# Patient Record
Sex: Male | Born: 1955 | Race: Black or African American | Hispanic: No | Marital: Married | Smoking: Never smoker
Health system: Southern US, Community
[De-identification: ages and names within clinical notes are randomized; demographics above are authoritative.]

## PROBLEM LIST (undated history)

## (undated) ENCOUNTER — Ambulatory Visit (HOSPITAL_COMMUNITY): Discharge status: Absent without leave | Payer: 59

## (undated) DIAGNOSIS — I509 Heart failure, unspecified: Secondary | ICD-10-CM

## (undated) DIAGNOSIS — I4891 Unspecified atrial fibrillation: Secondary | ICD-10-CM

## (undated) DIAGNOSIS — I429 Cardiomyopathy, unspecified: Secondary | ICD-10-CM

## (undated) HISTORY — DX: Heart failure, unspecified: I50.9

## (undated) HISTORY — DX: Cardiomyopathy, unspecified: I42.9

## (undated) HISTORY — DX: Unspecified atrial fibrillation: I48.91

---

## 2009-10-13 ENCOUNTER — Emergency Department (HOSPITAL_COMMUNITY): Admission: EM | Admit: 2009-10-13 | Discharge: 2009-10-13 | Payer: Self-pay | Admitting: Family Medicine

## 2009-11-28 ENCOUNTER — Emergency Department (HOSPITAL_COMMUNITY): Admission: EM | Admit: 2009-11-28 | Discharge: 2009-11-28 | Payer: Self-pay | Admitting: Family Medicine

## 2010-03-17 ENCOUNTER — Encounter
Admission: RE | Admit: 2010-03-17 | Discharge: 2010-03-17 | Payer: Self-pay | Source: Home / Self Care | Attending: Emergency Medicine | Admitting: Emergency Medicine

## 2010-06-19 LAB — POCT I-STAT, CHEM 8
BUN: 12 mg/dL (ref 6–23)
Chloride: 104 mEq/L (ref 96–112)
Potassium: 3.8 mEq/L (ref 3.5–5.1)
Sodium: 139 mEq/L (ref 135–145)
TCO2: 29 mmol/L (ref 0–100)
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
## 2013-11-07 ENCOUNTER — Encounter (HOSPITAL_COMMUNITY): Payer: Self-pay | Admitting: Emergency Medicine

## 2013-11-07 ENCOUNTER — Emergency Department (HOSPITAL_COMMUNITY)
Admission: EM | Admit: 2013-11-07 | Discharge: 2013-11-07 | Disposition: A | Discharge status: Home / Self Care | Payer: 59 | Attending: Emergency Medicine | Admitting: Emergency Medicine

## 2013-11-07 DIAGNOSIS — Z791 Long term (current) use of non-steroidal anti-inflammatories (NSAID): Secondary | ICD-10-CM | POA: Diagnosis not present

## 2013-11-07 DIAGNOSIS — Y9289 Other specified places as the place of occurrence of the external cause: Secondary | ICD-10-CM | POA: Insufficient documentation

## 2013-11-07 DIAGNOSIS — T63441A Toxic effect of venom of bees, accidental (unintentional), initial encounter: Secondary | ICD-10-CM

## 2013-11-07 DIAGNOSIS — T63461A Toxic effect of venom of wasps, accidental (unintentional), initial encounter: Secondary | ICD-10-CM | POA: Insufficient documentation

## 2013-11-07 DIAGNOSIS — Y93H2 Activity, gardening and landscaping: Secondary | ICD-10-CM | POA: Insufficient documentation

## 2013-11-07 DIAGNOSIS — IMO0001 Reserved for inherently not codable concepts without codable children: Secondary | ICD-10-CM | POA: Diagnosis present

## 2013-11-07 DIAGNOSIS — T6391XA Toxic effect of contact with unspecified venomous animal, accidental (unintentional), initial encounter: Secondary | ICD-10-CM | POA: Insufficient documentation

## 2013-11-07 MED ORDER — DIPHENHYDRAMINE HCL 25 MG PO TABS: 25.0000 mg | ORAL_TABLET | Freq: Four times a day (QID) | ORAL | Status: DC

## 2013-11-07 MED ORDER — DIPHENHYDRAMINE HCL 25 MG PO CAPS: 50.0000 mg | ORAL_CAPSULE | Freq: Once | ORAL | Status: DC

## 2013-11-07 MED ORDER — DIPHENHYDRAMINE HCL 25 MG PO CAPS
25.0000 mg | ORAL_CAPSULE | Freq: Once | ORAL | Status: AC
  Administered 2013-11-07: 25 mg via ORAL
  Filled 2013-11-07: qty 1

## 2013-11-07 MED ORDER — PREDNISONE (PAK) 10 MG PO TABS: ORAL_TABLET | Freq: Every day | ORAL | Status: DC

## 2013-11-07 NOTE — ED Provider Notes (Signed)
Medical screening examination/treatment/procedure(s) were performed by non-physician practitioner and as supervising physician I was immediately available for consultation/collaboration.   EKG Interpretation None        Richardean Canal, MD 11/07/13 2336

## 2013-11-07 NOTE — ED Notes (Signed)
Presents with left elbow redness and swelling began yesterday after a wasp sting to left elbow, denies pain, reports mild itching. Airway intact.

## 2013-11-07 NOTE — Discharge Instructions (Signed)
Read the information below.  Use the prescribed medication as directed.  Please discuss all new medications with your pharmacist.  You may return to the Emergency Department at any time for worsening condition or any new symptoms that concern you.  If you develop high fevers, difficulty swallowing or breathing, or you are unable to tolerate fluids by mouth, return to the ER immediately for a recheck.  If you develop increased redness, uncontrolled pain, pus draining from the wound, or fevers greater than 100.4, return to the ER immediately for a recheck.     Bee, Wasp, or Hornet Sting Your caregiver has diagnosed you as having an insect sting. An insect sting appears as a red lump in the skin that sometimes has a tiny hole in the center, or it may have a stinger in the center of the wound. The most common stings are from wasps, hornets and bees. Individuals have different reactions to insect stings.  A normal reaction may cause pain, swelling, and redness around the sting site.  A localized allergic reaction may cause swelling and redness that extends beyond the sting site.  A large local reaction may continue to develop over the next 12 to 36 hours.  On occasion, the reactions can be severe (anaphylactic reaction). An anaphylactic reaction may cause wheezing; difficulty breathing; chest pain; fainting; raised, itchy, red patches on the skin; a sick feeling to your stomach (nausea); vomiting; cramping; or diarrhea. If you have had an anaphylactic reaction to an insect sting in the past, you are more likely to have one again. HOME CARE INSTRUCTIONS   With bee stings, a small sac of poison is left in the wound. Brushing across this with something such as a credit card, or anything similar, will help remove this and decrease the amount of the reaction. This same procedure will not help a wasp sting as they do not leave behind a stinger and poison sac.  Apply a cold compress for 10 to 20 minutes every  hour for 1 to 2 days, depending on severity, to reduce swelling and itching.  To lessen pain, a paste made of water and baking soda may be rubbed on the bite or sting and left on for 5 minutes.  To relieve itching and swelling, you may use take medication or apply medicated creams or lotions as directed.  Only take over-the-counter or prescription medicines for pain, discomfort, or fever as directed by your caregiver.  Wash the sting site daily with soap and water. Apply antibiotic ointment on the sting site as directed.  If you suffered a severe reaction:  If you did not require hospitalization, an adult will need to stay with you for 24 hours in case the symptoms return.  You may need to wear a medical bracelet or necklace stating the allergy.  You and your family need to learn when and how to use an anaphylaxis kit or epinephrine injection.  If you have had a severe reaction before, always carry your anaphylaxis kit with you. SEEK MEDICAL CARE IF:   None of the above helps within 2 to 3 days.  The area becomes red, warm, tender, and swollen beyond the area of the bite or sting.  You have an oral temperature above 102 F (38.9 C). SEEK IMMEDIATE MEDICAL CARE IF:  You have symptoms of an allergic reaction which are:  Wheezing.  Difficulty breathing.  Chest pain.  Lightheadedness or fainting.  Itchy, raised, red patches on the skin.  Nausea, vomiting, cramping  or diarrhea. ANY OF THESE SYMPTOMS MAY REPRESENT A SERIOUS PROBLEM THAT IS AN EMERGENCY. Do not wait to see if the symptoms will go away. Get medical help right away. Call your local emergency services (911 in [place] DO NOT drive yourself to the hospital. MAKE SURE YOU:   Understand these instructions.  Will watch your condition.  Will get help right away if you are not doing well or get worse. Document Released: 03/20/2005 Document Revised: 06/12/2011 Document Reviewed: 09/04/2009 [facility] Patient Information  2015 [place], [place]. This information is not intended to replace advice given to you by your health care provider. Make sure you discuss any questions you have with your health care provider.

## 2013-11-07 NOTE — ED Provider Notes (Signed)
CSN: [id number]     Arrival date & time 11/07/13  2051 History  This chart was scribed for Garrett Carroll, [place] working with Richardean Canal, MD by Quintella Reichert, ED Scribe. This patient was seen in room TR07C/TR07C and the patient's care was started at 10:18 PM.   Chief Complaint  Patient presents with  . Allergic Reaction  . Insect Bite    The history is provided by the patient. No language interpreter was used.    HPI Comments: Garrett Carroll is a 58 y.o. male who presents to the Emergency Department complaining of a wasp sting to his left elbow sustained yesterday.  Pt states he was stung while trimming his hedges.  He has since developed redness, swelling and mild itching and "tightness" to that area.  He is able to move the elbow without pain.  He has placed ice on the area but has not attempted any other treatments pta.  He denies swelling or itching in mouth or throat, trouble swallowing, difficulty breathing, numbness or tingling in arms, fevers, chills, generalized body aches, CP, SOB, nausea, vomiting, or any other associated symptoms.  He denies prior h/o allergic reactions.   History reviewed. No pertinent past medical history.  History reviewed. No pertinent past surgical history.  History reviewed. No pertinent family history.   History  Substance Use Topics  . Smoking status: Never Smoker   . Smokeless tobacco: Not on file  . Alcohol Use: No     Review of Systems  HENT: Negative for trouble swallowing.   Respiratory: Negative for shortness of breath.   Cardiovascular: Negative for chest pain.  Gastrointestinal: Negative for nausea and vomiting.  Musculoskeletal: Negative for myalgias.  Skin: Positive for wound (wasp sting to left elbow, with redness, swelling and mild itching and "tightness" to that area).  All other systems reviewed and are negative.     Allergies  Review of patient's allergies indicates no known allergies.  Home Medications   Prior to  Admission medications   Medication Sig Start Date End Date Taking? Authorizing Provider  ibuprofen (ADVIL,MOTRIN) 200 MG tablet Take 200 mg by mouth every 6 (six) hours as needed for moderate pain.   Yes Historical Provider, MD   BP 112/73  Pulse 78  Temp(Src) 98 F (36.7 C) (Oral)  Resp 18  SpO2 95%  Physical Exam  Nursing note and vitals reviewed. Constitutional: He appears well-developed and well-nourished. No distress.  HENT:  Head: Normocephalic and atraumatic.  Mouth/Throat: Oropharynx is clear and moist. No oropharyngeal exudate.  Neck: Neck supple.  Cardiovascular: Normal rate and regular rhythm.   Pulmonary/Chest: Effort normal and breath sounds normal. No stridor. No respiratory distress. He has no wheezes. He has no rales.  Musculoskeletal:  Full AROM of left elbow.  Sensation intact.  Distal pulses intact.    Neurological: He is alert.  Skin: He is not diaphoretic.  Edema and erythema over the posterior distal upper left arm, no areas of induration or fluctuance.  Edema tracks just inferior to the elbow.  Nontender.    ED Course  Procedures (including critical care time)  DIAGNOSTIC STUDIES: Oxygen Saturation is 95% on room air, adequate by my interpretation.    COORDINATION OF CARE: 10:25 PM-Discussed treatment plan which includes Benadryl in the ED, prednisone prescription, cold compresses and elevation with pt at bedside and pt agreed to plan.     Labs Review Labs Reviewed - No data to display  Imaging Review No results found.  EKG Interpretation None      MDM   Final diagnoses:  Local reaction to bee sting, accidental or unintentional, initial encounter    Afebrile, nontoxic patient with local reaction to bee sting that occurred yesterday.  No tenderness or pain.  No e/o abscess.  Doubt cellulitis.   No airway symptoms or concerns.  Doubt systemic allergic reaction. Benadryl given in ED.  Joint decision making regarding medications. I gave  patient the option of steroid tonight or start dose pack tomorrow morning - pt prefers to start tomorrow morning so he can sleep well tonight.  As the reaction is only local and there is no immediate or urgent concern that would require more immediate medication, I believe this is a safe option.   D/C home with benadryl and prednisone.  Discussed result, findings, treatment, and follow up  with patient.  Pt given return precautions.  Pt verbalizes understanding and agrees with plan.         I personally performed the services described in this documentation, which was scribed in my presence. The recorded information has been reviewed and is accurate.   Lincoln UniversityEmily Vidalia Serpas, PA-C 11/07/13 2257

## 2013-11-07 NOTE — ED Notes (Signed)
The  Pt was stung by a wasp yesterday on the lt elbow.  He has pain red ness and swelling to the lt elbow area.  Swelling down into the lt  Forearm upper

## 2014-07-29 ENCOUNTER — Ambulatory Visit: Payer: 59 | Admitting: Podiatry

## 2015-01-06 ENCOUNTER — Emergency Department (HOSPITAL_COMMUNITY): Admission: EM | Admit: 2015-01-06 | Discharge: 2015-01-06 | Payer: Self-pay

## 2015-10-21 ENCOUNTER — Encounter (HOSPITAL_COMMUNITY): Payer: Self-pay | Admitting: *Deleted

## 2015-10-21 ENCOUNTER — Ambulatory Visit (HOSPITAL_COMMUNITY)
Admission: EM | Admit: 2015-10-21 | Discharge: 2015-10-21 | Disposition: A | Discharge status: Home / Self Care | Payer: 59 | Attending: Emergency Medicine | Admitting: Emergency Medicine

## 2015-10-21 DIAGNOSIS — L259 Unspecified contact dermatitis, unspecified cause: Secondary | ICD-10-CM

## 2015-10-21 MED ORDER — PREDNISONE 10 MG (21) PO TBPK: 10.0000 mg | ORAL_TABLET | Freq: Every day | ORAL | Status: DC

## 2015-10-21 NOTE — Discharge Instructions (Signed)
Start Prednisone as directed. Use Benadryl as needed for itching. Wash affected area with soap and water. Follow-up with your primary care provider if not rash is not improving within 3 to 4 days.   Contact Dermatitis Dermatitis is redness, soreness, and swelling (inflammation) of the skin. Contact dermatitis is a reaction to certain substances that touch the skin. There are two types of contact dermatitis:   Irritant contact dermatitis. This type is caused by something that irritates your skin, such as dry hands from washing them too much. This type does not require previous exposure to the substance for a reaction to occur. This type is more common.  Allergic contact dermatitis. This type is caused by a substance that you are allergic to, such as a nickel allergy or poison ivy. This type only occurs if you have been exposed to the substance (allergen) before. Upon a repeat exposure, your body reacts to the substance. This type is less common. CAUSES  Many different substances can cause contact dermatitis. Irritant contact dermatitis is most commonly caused by exposure to:   Makeup.   Soaps.   Detergents.   Bleaches.   Acids.   Metal salts, such as nickel.  Allergic contact dermatitis is most commonly caused by exposure to:   Poisonous plants.   Chemicals.   Jewelry.   Latex.   Medicines.   Preservatives in products, such as clothing.  RISK FACTORS This condition is more likely to develop in:   People who have jobs that expose them to irritants or allergens.  People who have certain medical conditions, such as asthma or eczema.  SYMPTOMS  Symptoms of this condition may occur anywhere on your body where the irritant has touched you or is touched by you. Symptoms include:  Dryness or flaking.   Redness.   Cracks.   Itching.   Pain or a burning feeling.   Blisters.  Drainage of small amounts of blood or clear fluid from skin cracks. With  allergic contact dermatitis, there may also be swelling in areas such as the eyelids, mouth, or genitals.  DIAGNOSIS  This condition is diagnosed with a medical history and physical exam. A patch skin test may be performed to help determine the cause. If the condition is related to your job, you may need to see an occupational medicine specialist. TREATMENT Treatment for this condition includes figuring out what caused the reaction and protecting your skin from further contact. Treatment may also include:   Steroid creams or ointments. Oral steroid medicines may be needed in more severe cases.  Antibiotics or antibacterial ointments, if a skin infection is present.  Antihistamine lotion or an antihistamine taken by mouth to ease itching.  A bandage (dressing). HOME CARE INSTRUCTIONS Skin Care  Moisturize your skin as needed.   Apply cool compresses to the affected areas.  Try taking a bath with:  Epsom salts. Follow the instructions on the packaging. You can get these at your local pharmacy or grocery store.  Baking soda. Pour a small amount into the bath as directed by your health care provider.  Colloidal oatmeal. Follow the instructions on the packaging. You can get this at your local pharmacy or grocery store.  Try applying baking soda paste to your skin. Stir water into baking soda until it reaches a paste-like consistency.  Do not scratch your skin.  Bathe less frequently, such as every other day.  Bathe in lukewarm water. Avoid using hot water. Medicines  Take or apply over-the-counter and  prescription medicines only as told by your health care provider.   If you were prescribed an antibiotic medicine, take or apply your antibiotic as told by your health care provider. Do not stop using the antibiotic even if your condition starts to improve. General Instructions  Keep all follow-up visits as told by your health care provider. This is important.  Avoid the  substance that caused your reaction. If you do not know what caused it, keep a journal to try to track what caused it. Write down:  What you eat.  What cosmetic products you use.  What you drink.  What you wear in the affected area. This includes jewelry.  If you were given a dressing, take care of it as told by your health care provider. This includes when to change and remove it. SEEK MEDICAL CARE IF:   Your condition does not improve with treatment.  Your condition gets worse.  You have signs of infection such as swelling, tenderness, redness, soreness, or warmth in the affected area.  You have a fever.  You have new symptoms. SEEK IMMEDIATE MEDICAL CARE IF:   You have a severe headache, neck pain, or neck stiffness.  You vomit.  You feel very sleepy.  You notice red streaks coming from the affected area.  Your bone or joint underneath the affected area becomes painful after the skin has healed.  The affected area turns darker.  You have difficulty breathing.   This information is not intended to replace advice given to you by your health care provider. Make sure you discuss any questions you have with your health care provider.   Document Released: 03/17/2000 Document Revised: 12/09/2014 Document Reviewed: 08/05/2014 Elsevier Interactive Patient Education [employer]

## 2015-10-21 NOTE — ED Provider Notes (Signed)
CSN: [id number]     Arrival date & time 10/21/15  1440 History   First MD Initiated Contact with Patient 10/21/15 [phone]     Chief Complaint  Patient presents with  . Rash   (Consider location/radiation/quality/duration/timing/severity/associated sxs/prior Treatment) HPI Comments: Patient presents with red, raised itchy rash that started on his right side of his neck 2 days ago and has spread to the right side of his face. The rash is spreading very close to his right eye. He has a few spots as well on his right wrist. Rash is not painful. He was pulling weeds in his yard last week and may have been exposed to poison ivy/oak. Has applied OTC Cortisone cream and taken oral Benadryl with minimal relief.   Patient is a 60 y.o. male presenting with rash. The history is provided by the patient.  Rash Location:  Head/neck and face Head/neck rash location:  R neck Facial rash location:  R cheek and face Quality: itchiness   Severity:  Moderate Onset quality:  Gradual Duration:  2 days Timing:  Constant Progression:  Spreading Chronicity:  New Context: plant contact   Relieved by:  Nothing Ineffective treatments:  Topical steroids Associated symptoms: no fever and no headaches     History reviewed. No pertinent past medical history. History reviewed. No pertinent past surgical history. History reviewed. No pertinent family history. Social History  Substance Use Topics  . Smoking status: Never Smoker   . Smokeless tobacco: None  . Alcohol Use: No    Review of Systems  Constitutional: Negative for fever.  Eyes: Negative for visual disturbance.  Skin: Positive for rash.  Neurological: Negative for headaches.    Allergies  Review of patient's allergies indicates no known allergies.  Home Medications   Prior to Admission medications   Medication Sig Start Date End Date Taking? Authorizing Provider  diphenhydrAMINE (BENADRYL) 25 MG tablet Take 1 tablet (25 mg total) by mouth every  6 (six) hours. X 3 days, then PRN itching and swelling 11/07/13   Trixie Dredge, PA-C  ibuprofen (ADVIL,MOTRIN) 200 MG tablet Take 200 mg by mouth every 6 (six) hours as needed for moderate pain.    Historical Provider, MD  predniSONE (STERAPRED UNI-PAK 21 TAB) 10 MG (21) TBPK tablet Take 1 tablet (10 mg total) by mouth daily. Take 6 tabs by mouth daily  for 2 days, then 5 tabs for 2 days, then 4 tabs for 2 days, then 3 tabs for 2 days, 2 tabs for 2 days, then 1 tab by mouth daily for 2 days 10/21/15   Sudie Grumbling, NP   Meds Ordered and Administered this Visit  Medications - No data to display  BP 113/70 mmHg  Pulse 54  Temp(Src) 98.7 F (37.1 C) (Oral)  Resp 12  SpO2 99% No data found.   Physical Exam  Constitutional: He is oriented to person, place, and time. He appears well-developed and well-nourished.  HENT:  Head: Normocephalic and atraumatic.    Right Ear: Hearing, tympanic membrane, external ear and ear canal normal.  Left Ear: Hearing, tympanic membrane, external ear and ear canal normal.  Nose: Nose normal.  Mouth/Throat: Oropharynx is clear and moist and mucous membranes are normal.  Multiple red raised papular lesions in varying sizes present on right side of neck, cheek, temple and lateral aspect of right eye. No discharge or crusting. No surrounding erythema. Non-tender.   Neck: Normal range of motion. Neck supple.  Cardiovascular: Normal rate, regular rhythm and  normal heart sounds.   Lymphadenopathy:    He has no cervical adenopathy.  Neurological: He is alert and oriented to person, place, and time.  Skin: Skin is warm, dry and intact. Rash noted. Rash is papular and urticarial.    ED Course  Procedures (including critical care time)  Labs Review Labs Reviewed - No data to display  Imaging Review No results found.   Visual Acuity Review  Right Eye Distance:   Left Eye Distance:   Bilateral Distance:    Right Eye Near:   Left Eye Near:    Bilateral  Near:         MDM   1. Contact dermatitis    Due to close proximity to eye and rash spreading on face, recommend Prednisone dose pack as directed. Continue oral OTC Benadryl as needed for itching. Clean area with soap and water only. Cool showers- avoid hot water. If rash continues to spread near eye, go to ER. Otherwise follow-up with your primary care provider if rash does not improve within 3 to 5 days.     Sudie Grumbling, NP 10/22/15 [phone]

## 2015-10-21 NOTE — ED Notes (Signed)
Pt  Has  Symptoms  Of  Rash  On r  Side face neck and shoulders      Symptoms    Not  releived  By  hydrocoirtisone cream

## 2016-08-05 DIAGNOSIS — L720 Epidermal cyst: Secondary | ICD-10-CM | POA: Diagnosis not present

## 2016-08-05 DIAGNOSIS — H6123 Impacted cerumen, bilateral: Secondary | ICD-10-CM | POA: Diagnosis not present

## 2016-08-05 DIAGNOSIS — R0982 Postnasal drip: Secondary | ICD-10-CM | POA: Diagnosis not present

## 2016-08-08 DIAGNOSIS — H6123 Impacted cerumen, bilateral: Secondary | ICD-10-CM | POA: Diagnosis not present

## 2017-08-07 DIAGNOSIS — K4190 Unilateral femoral hernia, without obstruction or gangrene, not specified as recurrent: Secondary | ICD-10-CM | POA: Diagnosis not present

## 2017-08-07 DIAGNOSIS — K4090 Unilateral inguinal hernia, without obstruction or gangrene, not specified as recurrent: Secondary | ICD-10-CM | POA: Diagnosis not present

## 2018-06-19 DIAGNOSIS — Z23 Encounter for immunization: Secondary | ICD-10-CM | POA: Diagnosis not present

## 2018-10-02 ENCOUNTER — Other Ambulatory Visit: Payer: Self-pay

## 2018-10-02 ENCOUNTER — Ambulatory Visit (HOSPITAL_COMMUNITY)
Admission: EM | Admit: 2018-10-02 | Discharge: 2018-10-02 | Disposition: A | Discharge status: Home / Self Care | Payer: 59 | Attending: Internal Medicine | Admitting: Internal Medicine

## 2018-10-02 ENCOUNTER — Encounter (HOSPITAL_COMMUNITY): Payer: Self-pay | Admitting: Emergency Medicine

## 2018-10-02 DIAGNOSIS — M545 Low back pain, unspecified: Secondary | ICD-10-CM

## 2018-10-02 MED ORDER — NAPROXEN 375 MG PO TABS: 375.0000 mg | ORAL_TABLET | Freq: Two times a day (BID) | ORAL | 0 refills | Status: DC

## 2018-10-02 MED ORDER — CYCLOBENZAPRINE HCL 10 MG PO TABS: 10.0000 mg | ORAL_TABLET | Freq: Three times a day (TID) | ORAL | 0 refills | Status: DC | PRN

## 2018-10-02 MED ORDER — KETOROLAC TROMETHAMINE 60 MG/2ML IM SOLN
INTRAMUSCULAR | Status: AC
  Filled 2018-10-02: qty 2

## 2018-10-02 MED ORDER — KETOROLAC TROMETHAMINE 60 MG/2ML IM SOLN
60.0000 mg | Freq: Once | INTRAMUSCULAR | Status: AC
  Administered 2018-10-02: 60 mg via INTRAMUSCULAR

## 2018-10-02 NOTE — ED Provider Notes (Signed)
[place]    CSN: [id number] Arrival date & time: 10/02/18  1708      History   Chief Complaint Chief Complaint  Patient presents with  . Back Pain    HPI Garrett Carroll is a 63 y.o. male with no past medical history comes to urgent care with complaint of back pain of 4 days duration.  Symptoms started on Saturday after he completed moving some boxes.  Pain is constant and throbbing, currently severe.  Aggravated by movement.  He is tried using a heating pad over prolonged period of time with no relief.  He denies any numbness or tingling the lower extremities.  No lower extremity weakness.  No fever or chills.   HPI  History reviewed. No pertinent past medical history.  There are no active problems to display for this patient.   No past surgical history on file.     Home Medications    Prior to Admission medications   Medication Sig Start Date End Date Taking? Authorizing Provider  diphenhydrAMINE (BENADRYL) 25 MG tablet Take 1 tablet (25 mg total) by mouth every 6 (six) hours. X 3 days, then PRN itching and swelling 11/07/13   West, Emily, PA-C  ibuprofen (ADVIL,MOTRIN) 200 MG tablet Take 200 mg by mouth every 6 (six) hours as needed for moderate pain.    [provider]  predniSONE (STERAPRED UNI-PAK 21 TAB) 10 MG (21) TBPK tablet Take 1 tablet (10 mg total) by mouth daily. Take 6 tabs by mouth daily  for 2 days, then 5 tabs for 2 days, then 4 tabs for 2 days, then 3 tabs for 2 days, 2 tabs for 2 days, then 1 tab by mouth daily for 2 days 10/21/15   Katy Apo, NP    Family History No family history on file.  Social History Social History   Tobacco Use  . Smoking status: Never Smoker  Substance Use Topics  . Alcohol use: No  . Drug use: No     Allergies   Patient has no known allergies.   Review of Systems Review of Systems  Constitutional: Positive for activity change. Negative for appetite change, chills, fatigue and fever.   HENT: Negative.   Respiratory: Negative.   Cardiovascular: Negative.   Gastrointestinal: Negative.   Endocrine: Negative.   Genitourinary: Negative.   Musculoskeletal: Positive for back pain. Negative for gait problem, joint swelling and myalgias.  Skin: Negative.   Allergic/Immunologic: Negative.   Neurological: Negative for dizziness, weakness, numbness and headaches.     Physical Exam Triage Vital Signs ED Triage Vitals  Enc Vitals Group     BP 10/02/18 1806 121/84     Pulse Rate 10/02/18 1806 61     Resp 10/02/18 1806 16     Temp 10/02/18 1806 98.2 F (36.8 C)     Temp Source 10/02/18 1806 Oral     SpO2 10/02/18 1806 98 %     Weight --      Height --      Head Circumference --      Peak Flow --      Pain Score 10/02/18 1807 10     Pain Loc --      Pain Edu? --      Excl. in [place]? --    No data found.  Updated Vital Signs BP 121/84   Pulse 61   Temp 98.2 F (36.8 C) (Oral)   Resp 16   SpO2 98%  Visual Acuity Right Eye Distance:   Left Eye Distance:   Bilateral Distance:    Right Eye Near:   Left Eye Near:    Bilateral Near:     Physical Exam Constitutional:      General: He is in acute distress.     Appearance: Normal appearance. He is not ill-appearing.  Cardiovascular:     Rate and Rhythm: Normal rate and regular rhythm.     Pulses: Normal pulses.     Heart sounds: Normal heart sounds.  Abdominal:     General: Bowel sounds are normal.     Palpations: Abdomen is soft.  Musculoskeletal: Normal range of motion.        General: Signs of injury present. No swelling or deformity.     Comments: Tenderness over the right sacroiliac area.  Skin:    Capillary Refill: Capillary refill takes less than 2 seconds.  Neurological:     General: No focal deficit present.     Mental Status: He is alert and oriented to person, place, and time.     Cranial Nerves: No cranial nerve deficit.     Sensory: No sensory deficit.     Motor: No weakness.      Coordination: Coordination normal.     Gait: Gait normal.      UC Treatments / Results  Labs (all labs ordered are listed, but only abnormal results are displayed) Labs Reviewed - No data to display  EKG   Radiology No results found.  Procedures Procedures (including critical care time)  Medications Ordered in UC Medications - No data to display  Initial Impression / Assessment and Plan / UC Course  I have reviewed the triage vital signs and the nursing notes.  Pertinent labs & imaging results that were available during my care of the patient were reviewed by me and considered in my medical decision making (see chart for details).     1.  Acute lower back pain: R ICE therapy education Flexeril 10 mg 3 times daily as needed for muscle spasms Naproxen 375 mg orally twice daily Toradol 60 mg IM x1 No indication for lumbar spine x-ray. Patient is advised to use warm compress over 5 to 10 minutes and wait for about 15 to 20 minutes before applying warm compress again. Final Clinical Impressions(s) / UC Diagnoses   Final diagnoses:  None   Discharge Instructions   None    ED Prescriptions    None     Controlled Substance Prescriptions  Controlled Substance Registry consulted? No   Merrilee Jansky, MD 10/02/18 [phone]

## 2018-10-02 NOTE — ED Triage Notes (Signed)
PT reports back pain that started Saturday. It is worsened by bending / moving boxes.

## 2019-08-11 ENCOUNTER — Encounter (HOSPITAL_COMMUNITY): Payer: Self-pay

## 2019-08-11 ENCOUNTER — Ambulatory Visit (HOSPITAL_COMMUNITY)
Admission: EM | Admit: 2019-08-11 | Discharge: 2019-08-11 | Disposition: A | Discharge status: Home / Self Care | Payer: 59 | Attending: Emergency Medicine | Admitting: Emergency Medicine

## 2019-08-11 ENCOUNTER — Other Ambulatory Visit: Payer: Self-pay

## 2019-08-11 DIAGNOSIS — T23169A Burn of first degree of back of unspecified hand, initial encounter: Secondary | ICD-10-CM | POA: Diagnosis not present

## 2019-08-11 DIAGNOSIS — T23221A Burn of second degree of single right finger (nail) except thumb, initial encounter: Secondary | ICD-10-CM

## 2019-08-11 MED ORDER — SILVER SULFADIAZINE 1 % EX CREA: 1.0000 "application " | TOPICAL_CREAM | Freq: Every day | CUTANEOUS | 0 refills | Status: DC
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
## 2019-08-11 NOTE — Discharge Instructions (Addendum)
Please use Tylenol [phone] mg every 4-6 hours, may supplement with Ibuprofen 600 mg every 8 hours May apply aloe vera for further relief/healing May apply wet gauze to keep cool Do not apply ice May use silvadene cream to burn  For any further burns run cool water over burn for 20 min immediately after
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
## 2022-06-11 ENCOUNTER — Encounter (HOSPITAL_COMMUNITY): Payer: Self-pay

## 2022-06-11 ENCOUNTER — Other Ambulatory Visit: Payer: Self-pay

## 2022-06-11 ENCOUNTER — Emergency Department (HOSPITAL_COMMUNITY): Payer: BC Managed Care – PPO

## 2022-06-11 ENCOUNTER — Inpatient Hospital Stay (HOSPITAL_COMMUNITY)
Admission: EM | Admit: 2022-06-11 | Discharge: 2022-06-13 | DRG: 291 | Disposition: A | Discharge status: Home / Self Care | Payer: BC Managed Care – PPO | Attending: Internal Medicine | Admitting: Internal Medicine

## 2022-06-11 DIAGNOSIS — M7989 Other specified soft tissue disorders: Secondary | ICD-10-CM

## 2022-06-11 DIAGNOSIS — E872 Acidosis, unspecified: Secondary | ICD-10-CM | POA: Diagnosis present

## 2022-06-11 DIAGNOSIS — R0602 Shortness of breath: Secondary | ICD-10-CM | POA: Diagnosis not present

## 2022-06-11 DIAGNOSIS — I110 Hypertensive heart disease with heart failure: Principal | ICD-10-CM | POA: Diagnosis present

## 2022-06-11 DIAGNOSIS — I493 Ventricular premature depolarization: Secondary | ICD-10-CM | POA: Diagnosis not present

## 2022-06-11 DIAGNOSIS — Z7901 Long term (current) use of anticoagulants: Secondary | ICD-10-CM

## 2022-06-11 DIAGNOSIS — I4891 Unspecified atrial fibrillation: Secondary | ICD-10-CM

## 2022-06-11 DIAGNOSIS — Z1152 Encounter for screening for COVID-19: Secondary | ICD-10-CM

## 2022-06-11 DIAGNOSIS — I5021 Acute systolic (congestive) heart failure: Secondary | ICD-10-CM | POA: Diagnosis present

## 2022-06-11 DIAGNOSIS — R9389 Abnormal findings on diagnostic imaging of other specified body structures: Secondary | ICD-10-CM

## 2022-06-11 DIAGNOSIS — Z6820 Body mass index (BMI) 20.0-20.9, adult: Secondary | ICD-10-CM

## 2022-06-11 DIAGNOSIS — I428 Other cardiomyopathies: Secondary | ICD-10-CM | POA: Diagnosis present

## 2022-06-11 DIAGNOSIS — Z79899 Other long term (current) drug therapy: Secondary | ICD-10-CM

## 2022-06-11 DIAGNOSIS — R0600 Dyspnea, unspecified: Secondary | ICD-10-CM

## 2022-06-11 DIAGNOSIS — E43 Unspecified severe protein-calorie malnutrition: Secondary | ICD-10-CM | POA: Diagnosis present

## 2022-06-11 DIAGNOSIS — Z8249 Family history of ischemic heart disease and other diseases of the circulatory system: Secondary | ICD-10-CM

## 2022-06-11 DIAGNOSIS — R7989 Other specified abnormal findings of blood chemistry: Secondary | ICD-10-CM

## 2022-06-11 LAB — CBC WITH DIFFERENTIAL/PLATELET
Abs Immature Granulocytes: 0.01 10*3/uL (ref 0.00–0.07)
Basophils Absolute: 0.1 10*3/uL (ref 0.0–0.1)
Basophils Relative: 1 %
Eosinophils Absolute: 0 10*3/uL (ref 0.0–0.5)
Eosinophils Relative: 0 %
HCT: 42.6 % (ref 39.0–52.0)
Hemoglobin: 14.1 g/dL (ref 13.0–17.0)
Immature Granulocytes: 0 %
Lymphocytes Relative: 29 %
Lymphs Abs: 1.4 10*3/uL (ref 0.7–4.0)
MCH: 30.3 pg (ref 26.0–34.0)
MCHC: 33.1 g/dL (ref 30.0–36.0)
MCV: 91.6 fL (ref 80.0–100.0)
Monocytes Absolute: 0.5 10*3/uL (ref 0.1–1.0)
Monocytes Relative: 10 %
Neutro Abs: 2.8 10*3/uL (ref 1.7–7.7)
Neutrophils Relative %: 60 %
Platelets: 158 10*3/uL (ref 150–400)
RBC: 4.65 MIL/uL (ref 4.22–5.81)
RDW: 14.7 % (ref 11.5–15.5)
WBC: 4.8 10*3/uL (ref 4.0–10.5)
nRBC: 0 % (ref 0.0–0.2)

## 2022-06-11 LAB — RESP PANEL BY RT-PCR (RSV, FLU A&B, COVID)  RVPGX2
Influenza A by PCR: NEGATIVE
Influenza B by PCR: NEGATIVE
Resp Syncytial Virus by PCR: NEGATIVE
SARS Coronavirus 2 by RT PCR: NEGATIVE

## 2022-06-11 LAB — COMPREHENSIVE METABOLIC PANEL
ALT: 37 U/L (ref 0–44)
AST: 30 U/L (ref 15–41)
Albumin: 3.8 g/dL (ref 3.5–5.0)
Alkaline Phosphatase: 60 U/L (ref 38–126)
Anion gap: 10 (ref 5–15)
BUN: 12 mg/dL (ref 8–23)
CO2: 21 mmol/L — ABNORMAL LOW (ref 22–32)
Calcium: 9.2 mg/dL (ref 8.9–10.3)
Chloride: 109 mmol/L (ref 98–111)
Creatinine, Ser: 1.13 mg/dL (ref 0.61–1.24)
GFR, Estimated: >60 mL/min (ref >60)
Glucose, Bld: 106 mg/dL — ABNORMAL HIGH (ref 70–99)
Potassium: 3.9 mmol/L (ref 3.5–5.1)
Sodium: 140 mmol/L (ref 135–145)
Total Bilirubin: 2.4 mg/dL — ABNORMAL HIGH (ref 0.3–1.2)
Total Protein: 6.5 g/dL (ref 6.5–8.1)

## 2022-06-11 LAB — T4, FREE: Free T4: 1.35 ng/dL — ABNORMAL HIGH (ref 0.61–1.12)

## 2022-06-11 LAB — MAGNESIUM: Magnesium: 2.2 mg/dL (ref 1.7–2.4)

## 2022-06-11 LAB — BRAIN NATRIURETIC PEPTIDE: B Natriuretic Peptide: 618.1 pg/mL — ABNORMAL HIGH (ref 0.0–100.0)

## 2022-06-11 LAB — TSH: TSH: 2.376 u[IU]/mL (ref 0.350–4.500)

## 2022-06-11 LAB — TROPONIN I (HIGH SENSITIVITY): Troponin I (High Sensitivity): 16 ng/L (ref <18)

## 2022-06-11 MED ORDER — PROCHLORPERAZINE EDISYLATE 10 MG/2ML IJ SOLN: 5.0000 mg | Freq: Four times a day (QID) | INTRAMUSCULAR | Status: DC | PRN

## 2022-06-11 MED ORDER — DIGOXIN 0.25 MG/ML IJ SOLN
0.2500 mg | Freq: Once | INTRAMUSCULAR | Status: AC
  Administered 2022-06-12: 0.25 mg via INTRAVENOUS
  Filled 2022-06-11: qty 1

## 2022-06-11 MED ORDER — DILTIAZEM HCL 25 MG/5ML IV SOLN
20.0000 mg | Freq: Once | INTRAVENOUS | Status: AC
  Administered 2022-06-11: 20 mg via INTRAVENOUS
  Filled 2022-06-11: qty 5

## 2022-06-11 MED ORDER — ACETAMINOPHEN 325 MG PO TABS
650.0000 mg | ORAL_TABLET | Freq: Four times a day (QID) | ORAL | Status: DC | PRN
  Administered 2022-06-12: 650 mg via ORAL
  Filled 2022-06-11: qty 2

## 2022-06-11 MED ORDER — DILTIAZEM LOAD VIA INFUSION
10.0000 mg | Freq: Once | INTRAVENOUS | Status: AC
  Administered 2022-06-11: 10 mg via INTRAVENOUS
  Filled 2022-06-11: qty 10

## 2022-06-11 MED ORDER — MELATONIN 5 MG PO TABS: 5.0000 mg | ORAL_TABLET | Freq: Every evening | ORAL | Status: DC | PRN

## 2022-06-11 MED ORDER — POLYETHYLENE GLYCOL 3350 17 G PO PACK: 17.0000 g | PACK | Freq: Every day | ORAL | Status: DC | PRN

## 2022-06-11 MED ORDER — DIGOXIN 0.25 MG/ML IJ SOLN
0.5000 mg | Freq: Once | INTRAMUSCULAR | Status: AC
  Administered 2022-06-11: 0.5 mg via INTRAVENOUS
  Filled 2022-06-11: qty 2

## 2022-06-11 MED ORDER — DILTIAZEM HCL-DEXTROSE 125-5 MG/125ML-% IV SOLN (PREMIX)
5.0000 mg/h | INTRAVENOUS | Status: DC
  Administered 2022-06-11: 5 mg/h via INTRAVENOUS
  Filled 2022-06-11: qty 125

## 2022-06-11 MED ORDER — HEPARIN (PORCINE) 25000 UT/250ML-% IV SOLN
1250.0000 [IU]/h | INTRAVENOUS | Status: DC
  Administered 2022-06-11: 1250 [IU]/h via INTRAVENOUS
  Filled 2022-06-11: qty 250

## 2022-06-11 MED ORDER — ENOXAPARIN SODIUM 40 MG/0.4ML IJ SOSY
40.0000 mg | PREFILLED_SYRINGE | INTRAMUSCULAR | Status: DC
  Administered 2022-06-11: 40 mg via SUBCUTANEOUS
  Filled 2022-06-11: qty 0.4

## 2022-06-11 NOTE — Progress Notes (Signed)
CARDIOLOGY CONSULT NOTE  Patient ID: Garrett Carroll MRN: [id number] DOB/AGE: 1955-05-24 67 y.o.  Admit date: 06/11/2022 Attending physician: Kayleen Memos, DO Primary Physician:  Iona Beard, MD Outpatient Cardiology Provider: NA Inpatient Cardiologist: Rex Kras, DO, [facility]  Reason of consultation: New onset of atrial fibrillation with rapid ventricular rate Referring physician: Dr. Varney Biles  Chief complaint: Shortness of breath  HPI:  Garrett Carroll is a 67 y.o. African-American male who presents with a chief complaint of " shortness of breath."  No known past medical history.  Over the last couple nights has been experiencing shortness of breath and difficulty laying flat.  Initially thought it could be sinus infection but due to continued symptoms he went to urgent care.  At the urgent care they he was told that it was a 3-hour wait and therefore he comes to the ED for further evaluation.  Patient was diagnosed with new onset of atrial fibrillation with rapid ventricular rate.  Cardiology was consulted for further management and medicine was called to admit.  No prior history of gastrointestinal bleeding, intracranial bleeding, or falls.  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  He plans to return in the next 6 months.  No recent sick contacts.  He denies anginal chest pain.  No prior history of CAD/PCI/DVT/PE/CVA/TIA.  ALLERGIES: No Known Allergies  PAST MEDICAL HISTORY: None.  PAST SURGICAL HISTORY: None.  FAMILY HISTORY: Mom died due to cancer in her 75s. Dad died at age of 27 No family history of premature coronary disease, sudden cardiac death, cardiomyopathy   SOCIAL HISTORY:  The patient  reports that he has never smoked. He does not have any smokeless tobacco history on file. He reports that he does not drink alcohol and does not use drugs.  MEDICATIONS: Current Outpatient Medications  Medication Instructions    cyclobenzaprine (FLEXERIL) 10 mg, Oral, 3 times daily PRN   diphenhydrAMINE (BENADRYL) 25 mg, Oral, Every 6 hours, X 3 days, then PRN itching and swelling   naproxen (NAPROSYN) 375 mg, Oral, 2 times daily   predniSONE (STERAPRED UNI-PAK 21 TAB) 10 mg, Oral, Daily, Take 6 tabs by mouth daily  for 2 days, then 5 tabs for 2 days, then 4 tabs for 2 days, then 3 tabs for 2 days, 2 tabs for 2 days, then 1 tab by mouth daily for 2 days   silver sulfADIAZINE (SILVADENE) 1 % cream 1 application , Topical, Daily    REVIEW OF SYSTEMS: Review of Systems  HENT:         Fullness in sinuses  Cardiovascular:  Positive for palpitations and paroxysmal nocturnal dyspnea. Negative for chest pain, claudication, dyspnea on exertion, irregular heartbeat, leg swelling, near-syncope, orthopnea and syncope.  Respiratory:  Positive for shortness of breath.   Hematologic/Lymphatic: Negative for bleeding problem.  Musculoskeletal:  Negative for muscle cramps and myalgias.  Neurological:  Negative for dizziness and light-headedness.  All other systems reviewed and are negative.   PHYSICAL EXAMINATION: PHYSICAL EXAM: Temp:  [98 F (36.7 C)-98.2 F (36.8 C)] 98 F (36.7 C) (03/10 2336) Pulse Rate:  [53-110] 70 (03/10 2336) Resp:  [16-29] 19 (03/10 2336) BP: (98-114)/(82-97) 98/82 (03/10 1900) SpO2:  [93 %-100 %] 94 % (03/10 2336) Weight:  [86.6 kg-88.5 kg] 86.6 kg (03/10 2053)  Intake/Output:  Intake/Output Summary (Last 24 hours) at 06/11/2022 2354 Last data filed at 06/11/2022 2200 Gross per 24 hour  Intake 480 ml  Output --  Net 480  ml     Net IO Since Admission: 480 mL [06/11/22 2354]  Weights:     06/11/2022    8:53 PM 06/11/2022    3:09 PM  Last 3 Weights  Weight (lbs) 191 lb 195 lb  Weight (kg) 86.637 kg 88.451 kg     Physical Exam  Constitutional: No distress.  Age appropriate, hemodynamically stable.   Neck: No JVD present.  Cardiovascular: Normal rate, S1 normal, S2 normal, intact  distal pulses and normal pulses. An irregularly irregular rhythm present. Exam reveals no gallop, no S3 and no S4.  No murmurs rubs or gallops appreciated secondary to tachycardia.  Pulmonary/Chest: Effort normal and breath sounds normal. No stridor. He has no wheezes. He has no rales.  Abdominal: Soft. Bowel sounds are normal. He exhibits no distension. There is no abdominal tenderness.  Musculoskeletal:        General: Edema (+1 bilaterally) present.     Cervical back: Neck supple.  Neurological: He is alert and oriented to person, place, and time. He has intact cranial nerves (2-12).  Skin: Skin is warm and moist.    LAB RESULTS: Chemistry Recent Labs  Lab 06/11/22 1522  NA 140  K 3.9  CL 109  CO2 21*  GLUCOSE 106*  BUN 12  CREATININE 1.13  CALCIUM 9.2  PROT 6.5  ALBUMIN 3.8  AST 30  ALT 37  ALKPHOS 60  BILITOT 2.4*  GFRNONAA >60  ANIONGAP 10    Hematology Recent Labs  Lab 06/11/22 1522  WBC 4.8  RBC 4.65  HGB 14.1  HCT 42.6  MCV 91.6  MCH 30.3  MCHC 33.1  RDW 14.7  PLT 158   High Sensitivity Troponin:   Recent Labs  Lab 06/11/22 1522  TROPONINIHS 16     Cardiac EnzymesNo results for input(s): "TROPONINI" in the last 168 hours. No results for input(s): "TROPIPOC" in the last 168 hours.  BNP Recent Labs  Lab 06/11/22 1522  BNP 618.1*    DDimer No results for input(s): "DDIMER" in the last 168 hours.  Hemoglobin A1c: No results found for: "HGBA1C", "MPG" TSH  Recent Labs    06/11/22 1524  TSH 2.376   Lipid Panel No results found for: "CHOL", "HDL", "LDLCALC", "LDLDIRECT", "TRIG", "CHOLHDL" Drugs of Abuse  No results found for: "LABOPIA", "COCAINSCRNUR", "LABBENZ", "AMPHETMU", "THCU", "LABBARB"    CARDIAC DATABASE: EKG: 06/11/2022: Atrial fibrillation with rapid ventricular rate, 145 bpm, LVH per voltage criteria. 06/11/2022: Atrial fibrillation, 101 bpm, LVH per voltage criteria  Echocardiogram: Pending  Scheduled Meds:  [START ON  06/12/2022] digoxin  0.25 mg Intravenous Once    Continuous Infusions:  diltiazem (CARDIZEM) infusion 5 mg/hr (06/11/22 2336)   heparin 1,250 Units/hr (06/11/22 2144)    PRN Meds: acetaminophen, melatonin, polyethylene glycol, prochlorperazine  IMPRESSION & RECOMMENDATIONS: Garrett Carroll is a 67 y.o. African-American male with no significant past medical history.  Impression:  New onset of A-fib with RVR Shortness of breath Paroxysmal nocturnal dyspnea Lower extremity swelling Elevated BNP Abnormal chest x-ray  Plan:  ED physician initially contacted with regards to management of new onset of atrial fibrillation in a patient with low CHA2DS2-VASc score.  Shared decision was to control his rate plus or minus rhythm.  Despite a low CHA2DS2-VASc score would recommend short course of anticoagulation as the duration of A-fib is unknown.  ED physician called requesting a consultation as he is being admitted to medicine due to poor control of ventricular rate despite being on parenteral Cardizem.  Rate control: Cardizem drip Rhythm control: N/A Thromboembolic prophylaxis: IV heparin drip  CHA2DS2-VASc SCORE is 1 which correlates to 1.2% risk of stroke per year (age).  Check TSH, respiratory panel, urine drug screen, H&H, A1c, fasting lipids, direct LDL.  Due to soft blood pressure since initiation of Cardizem will start digoxin 500 mcg x 1 IV push followed by 6 hours later 250 mcg IV push x 1.  No prior history of intracranial bleeding or gastrointestinal bleeding.  If he does not restore normal sinus rhythm spontaneously may consider TEE guided cardioversion prior to discharge.  Elevated BNP, shortness of breath, PND, and mild lower extremity swelling may be secondary to volume overload due to A-fib with RVR.  However, new onset of CHF cannot be ruled out entirely.  Echo will be ordered to evaluate for structural heart disease and left ventricular systolic function.  Plan of care  discussed with patient and attending physician Dr. Nevada Crane.  Otherwise management of his other problems and comorbidities per primary team.   Total encounter time 66 minutes. *Total Encounter Time as defined by the Centers for Medicare and Medicaid Services includes, in addition to the face-to-face time of a patient visit (documented in the note above) non-face-to-face time: obtaining and reviewing outside history, ordering and reviewing medications, tests or procedures, care coordination (communications with other health care professionals or caregivers) and documentation in the medical record.  Patient's questions and concerns were addressed to his satisfaction. He voices understanding of the instructions provided during this encounter.   This note was created using a voice recognition software as a result there may be grammatical errors inadvertently enclosed that do not reflect the nature of this encounter. Every attempt is made to correct such errors.  Mechele Claude [facility]  Pager:  [phone] Office: [phone] 06/11/2022, 11:54 PM

## 2022-06-11 NOTE — H&P (Signed)
History and Physical  Garrett Carroll [id number] DOB: May 31, 1955 DOA: 06/11/2022  Referring physician: Sherrill Raring  PCP: Iona Beard, MD  Outpatient Specialists: None Patient coming from: Home  Chief Complaint: Shortness of breath   HPI: Garrett Carroll is a 67 y.o. male with no significant past medical history who presented to [facility] ED with complaints of shortness of breath over 2 days duration.  Associated with nasal congestion.  No chest pain, or cough.  Has swelling in his legs.  No use of tobacco.  Rarely drinks alcohol, maybe on holidays.  He presented to the ED for further evaluation.  In the ED, on exam, he has bilateral lower extremity pitting edema R>L.  Noted to be significantly tachycardic with an irregular rate and rhythm.  Twelve-lead EKG revealed A-fib with RVR.  Chest x-ray showing cardiomegaly with streaky right basilar airspace opacity which may represent atelectasis versus pneumonia.  Probable small bilateral pleural effusions.  Ovoid 2.3 cm nodular density projecting within the right middle lung.  Consider follow-up with noncontrast CT of the chest or short interval repeat chest radiograph.    The patient was started on Cardizem drip.  EDP consulted cardiology.  Dr. Terri Skains recommended heparin drip for now while we are checking his risk factors.  Thus far his Chadsvasc score is 1.  A1c and 2D echo are pending.  TRH, hospitalist service, was asked to admit.  ED Course: Tmax 98.2.  BP 100/82, pulse 56, respiration rate 21, O2 saturation 96% on room air.  Lab studies remarkable for serum bicarb 21, glucose 106, total bilirubin 2.4.  BNP 616.  Troponin 16.  CBC essentially unremarkable.  Review of Systems: Review of systems as noted in the HPI. All other systems reviewed and are negative.  Past medical history: None.  Past surgical history None.  Social History:  reports that he has never smoked. He does not have any smokeless tobacco history on file. He reports that he does  not drink alcohol and does not use drugs.   No Known Allergies  Family History  Family history unknown: Yes      Prior to Admission medications   Medication Sig Start Date End Date Taking? Authorizing Provider  cyclobenzaprine (FLEXERIL) 10 MG tablet Take 1 tablet (10 mg total) by mouth 3 (three) times daily as needed for muscle spasms. 10/02/18   Lamptey, Myrene Galas, MD  diphenhydrAMINE (BENADRYL) 25 MG tablet Take 1 tablet (25 mg total) by mouth every 6 (six) hours. X 3 days, then PRN itching and swelling 11/07/13   West, Emily, PA-C  naproxen (NAPROSYN) 375 MG tablet Take 1 tablet (375 mg total) by mouth 2 (two) times daily. 10/02/18   Lamptey, Myrene Galas, MD  predniSONE (STERAPRED UNI-PAK 21 TAB) 10 MG (21) TBPK tablet Take 1 tablet (10 mg total) by mouth daily. Take 6 tabs by mouth daily  for 2 days, then 5 tabs for 2 days, then 4 tabs for 2 days, then 3 tabs for 2 days, 2 tabs for 2 days, then 1 tab by mouth daily for 2 days 10/21/15   Katy Apo, NP  silver sulfADIAZINE (SILVADENE) 1 % cream Apply 1 application topically daily. 08/11/19   Wieters, Elesa Hacker, PA-C    Physical Exam: BP 98/82   Pulse 72   Temp 98 F (36.7 C)   Resp (!) 24   Ht '6\' 9"'$  (2.057 m)   Wt 88.5 kg   SpO2 93%   BMI 20.90 kg/m   General: 66  y.o. year-old male well developed well nourished in no acute distress.  Alert and oriented x3. Cardiovascular: Regular rate and rhythm with no rubs or gallops.  No thyromegaly or JVD noted.  1+ pitting edema in lower extremity edema bilaterally. 2/4 pulses in all 4 extremities. Respiratory: Very faint rales at bases.  No wheezing noted. Good inspiratory effort. Abdomen: Soft nontender nondistended with normal bowel sounds x4 quadrants. Muskuloskeletal: No cyanosis or clubbing.  1+ pitting edema noted bilaterally Neuro: CN II-XII intact, strength, sensation, reflexes Skin: No ulcerative lesions noted or rashes Psychiatry: Judgement and insight appear normal. Mood is  appropriate for condition and setting          Labs on Admission:  Basic Metabolic Panel: Recent Labs  Lab 06/11/22 1522  NA 140  K 3.9  CL 109  CO2 21*  GLUCOSE 106*  BUN 12  CREATININE 1.13  CALCIUM 9.2  MG 2.2   Liver Function Tests: Recent Labs  Lab 06/11/22 1522  AST 30  ALT 37  ALKPHOS 60  BILITOT 2.4*  PROT 6.5  ALBUMIN 3.8   No results for input(s): "LIPASE", "AMYLASE" in the last 168 hours. No results for input(s): "AMMONIA" in the last 168 hours. CBC: Recent Labs  Lab 06/11/22 1522  WBC 4.8  NEUTROABS 2.8  HGB 14.1  HCT 42.6  MCV 91.6  PLT 158   Cardiac Enzymes: No results for input(s): "CKTOTAL", "CKMB", "CKMBINDEX", "TROPONINI" in the last 168 hours.  BNP (last 3 results) Recent Labs    06/11/22 1522  BNP 618.1*    ProBNP (last 3 results) No results for input(s): "PROBNP" in the last 8760 hours.  CBG: No results for input(s): "GLUCAP" in the last 168 hours.  Radiological Exams on Admission: DG Chest Portable 1 View  Result Date: 06/11/2022 CLINICAL DATA:  Shortness of breath EXAM: PORTABLE CHEST 1 VIEW COMPARISON:  None Available. FINDINGS: Cardiomegaly. Streaky right basilar airspace opacity. Probable small bilateral pleural effusions. There is a ovoid 2.3 cm nodular density projecting within the right mid lung. No pneumothorax. IMPRESSION: 1. Cardiomegaly with streaky right basilar airspace opacity, which may represent atelectasis versus pneumonia. Probable small bilateral pleural effusions. 2. Ovoid 2.3 cm nodular density projecting within the right mid lung. Consider follow-up with noncontrast CT of the chest or short interval repeat chest radiograph. Electronically Signed   By: Davina Poke D.O.   On: 06/11/2022 15:46    EKG: I independently viewed the EKG done and my findings are as followed: Atrial fibrillation with RVR, rate of 145.  Nonspecific ST-T changes.  QTc 521.  Assessment/Plan Present on Admission:  New onset atrial  fibrillation ([place])  Principal Problem:   New onset atrial fibrillation ([place])  New onset atrial fibrillation with RVR Presented with tachycardia with rate in the 140s, found to be in A-fib with RVR on 12 lead EKG.  Was started on Cardizem drip in the ED with improvement of his heart rate. Obtain TSH, 2D echo, A1C, fasting lipid panel. CHA2DS2-VASc, 1 Cardiology consulted, recommended Heparin drip while we are checking his risk factors.  Might need cardioversion if he does not convert spontaneously. Close monitor on telemetry  Elevated BNP, peripheral edema, with concern for acute CHF, unspecified Small bilateral pleural effusions Presented with elevated BNP 616, 1+ pitting edema in lower extremities bilaterally. Hold off IV fluid due to elevated BNP and bilateral pleural effusions High sensitive troponin, first set is negative, 16. Follow 2D echo Start strict I's and O's and daily weight  Prolonged QTc QTc on admission 12 EKG 521 Avoid QTc prolonging agents Optimize magnesium and potassium levels  Mild non anion gap metabolic acidosis Serum bicarb 21 Anion gap 10 Closely monitor  Elevated T bilirubin T. bili 2.3 Rest of LFTs are unremarkable. Repeat CMP in the morning  Incidental findings: Ovoid 2.3 cm nodular density projecting within the right middle lung.  Consider follow-up with noncontrast CT of the chest or short interval repeat chest radiograph    DVT prophylaxis: Heparin drip as recommended by cardiology.  Code Status: Full code  Family Communication: None at bedside  Disposition Plan: Admitted to telemetry cardiac unit  Consults called: Cardiology consulted  Admission status: Observation status   Status is: Observation    Kayleen Memos MD [facility] Pager [phone]  If 7PM-7AM, please contact night-coverage [email or web address] Password [facility]  06/11/2022, 7:36 PM

## 2022-06-11 NOTE — ED Notes (Signed)
ED TO INPATIENT HANDOFF REPORT  ED Nurse Name and Phone #: Albina Billet [phone]  S Name/Age/Gender Garrett Carroll 67 y.o. male Room/Bed: 006C/006C  Code Status   Code Status: Full Code  Home/SNF/Other Home Patient oriented to: self, place, time, and situation Is this baseline? Yes   Triage Complete: Triage complete  Chief Complaint New onset atrial fibrillation [facility]) [I48.91]  Triage Note Pt reports shortness of breath and dizziness for the past 2 days. Pt also dealing with cold symptoms and allergies.   Allergies No Known Allergies  Level of Care/Admitting Diagnosis ED Disposition   ED Disposition: Admit Condition: None Comment: Hospital Area: [place] [[id number]]  Level of Care: Telemetry Cardiac [103]  May place patient in observation at [facility] or [place] if equivalent level of care is available:: No  Covid Evaluation: Asymptomatic - no recent exposure (last 10 days) testing not required  Diagnosis: New onset atrial fibrillation [facility]) [id number]  Admitting Physician: Kayleen Memos [id number]  Attending Physician: Kayleen Memos [id number]      B Medical/Surgery History History reviewed. No pertinent past medical history. History reviewed. No pertinent surgical history.   A IV Location/Drains/Wounds Patient Lines/Drains/Airways Status     Active Line/Drains/Airways     Name Placement date Placement time Site Days   Peripheral IV 06/11/22 20 G Right Antecubital 06/11/22  1536  Antecubital  less than 1            Intake/Output Last 24 hours No intake or output data in the 24 hours ending 06/11/22 1942  Labs/Imaging Results for orders placed or performed during the hospital encounter of 06/11/22 (from the past 48 hour(s))  Resp panel by RT-PCR (RSV, Flu A&B, Covid) Anterior Nasal Swab     Status: None   Collection Time: 06/11/22  3:11 PM   Specimen: Anterior Nasal Swab  Result Value Ref Range   SARS Coronavirus 2 by RT PCR NEGATIVE  NEGATIVE   Influenza A by PCR NEGATIVE NEGATIVE   Influenza B by PCR NEGATIVE NEGATIVE    Comment: (NOTE) The Xpert Xpress SARS-CoV-2/FLU/RSV plus assay is intended as an aid in the diagnosis of influenza from Nasopharyngeal swab specimens and should not be used as a sole basis for treatment. Nasal washings and aspirates are unacceptable for Xpert Xpress SARS-CoV-2/FLU/RSV testing.  Fact Sheet for Patients: EntrepreneurPulse.com.au  Fact Sheet for Healthcare Providers: IncredibleEmployment.be  This test is not yet approved or cleared by the [place] FDA and has been authorized for detection and/or diagnosis of SARS-CoV-2 by FDA under an Emergency Use Authorization (EUA). This EUA will remain in effect (meaning this test can be used) for the duration of the COVID-19 declaration under Section 564(b)(1) of the Act, 21 U.S.C. section 360bbb-3(b)(1), unless the authorization is terminated or revoked.     Resp Syncytial Virus by PCR NEGATIVE NEGATIVE    Comment: (NOTE) Fact Sheet for Patients: EntrepreneurPulse.com.au  Fact Sheet for Healthcare Providers: IncredibleEmployment.be  This test is not yet approved or cleared by the [place] FDA and has been authorized for detection and/or diagnosis of SARS-CoV-2 by FDA under an Emergency Use Authorization (EUA). This EUA will remain in effect (meaning this test can be used) for the duration of the COVID-19 declaration under Section 564(b)(1) of the Act, 21 U.S.C. section 360bbb-3(b)(1), unless the authorization is terminated or revoked.  Performed at [facility], [place] [address], [place], [place] [address]   Comprehensive metabolic panel     Status: Abnormal  Collection Time: 06/11/22  3:22 PM  Result Value Ref Range   Sodium 140 135 - 145 mmol/L   Potassium 3.9 3.5 - 5.1 mmol/L   Chloride 109 98 - 111 mmol/L   CO2 21 (L) 22 - 32 mmol/L    Glucose, Bld 106 (H) 70 - 99 mg/dL    Comment: Glucose reference range applies only to samples taken after fasting for at least 8 hours.   BUN 12 8 - 23 mg/dL   Creatinine, Ser 1.13 0.61 - 1.24 mg/dL   Calcium 9.2 8.9 - 10.3 mg/dL   Total Protein 6.5 6.5 - 8.1 g/dL   Albumin 3.8 3.5 - 5.0 g/dL   AST 30 15 - 41 U/L   ALT 37 0 - 44 U/L   Alkaline Phosphatase 60 38 - 126 U/L   Total Bilirubin 2.4 (H) 0.3 - 1.2 mg/dL   GFR, Estimated >60 >60 mL/min    Comment: (NOTE) Calculated using the CKD-EPI Creatinine Equation (2021)    Anion gap 10 5 - 15    Comment: Performed at [facility] [address] [place], [place] [address]  CBC with Differential     Status: None   Collection Time: 06/11/22  3:22 PM  Result Value Ref Range   WBC 4.8 4.0 - 10.5 K/uL   RBC 4.65 4.22 - 5.81 MIL/uL   Hemoglobin 14.1 13.0 - 17.0 g/dL   HCT 42.6 39.0 - 52.0 %   MCV 91.6 80.0 - 100.0 fL   MCH 30.3 26.0 - 34.0 pg   MCHC 33.1 30.0 - 36.0 g/dL   RDW 14.7 11.5 - 15.5 %   Platelets 158 150 - 400 K/uL   nRBC 0.0 0.0 - 0.2 %   Neutrophils Relative % 60 %   Neutro Abs 2.8 1.7 - 7.7 K/uL   Lymphocytes Relative 29 %   Lymphs Abs 1.4 0.7 - 4.0 K/uL   Monocytes Relative 10 %   Monocytes Absolute 0.5 0.1 - 1.0 K/uL   Eosinophils Relative 0 %   Eosinophils Absolute 0.0 0.0 - 0.5 K/uL   Basophils Relative 1 %   Basophils Absolute 0.1 0.0 - 0.1 K/uL   Immature Granulocytes 0 %   Abs Immature Granulocytes 0.01 0.00 - 0.07 K/uL    Comment: Performed at [facility], [address], [place], [place] [address]  Troponin I (High Sensitivity)     Status: None   Collection Time: 06/11/22  3:22 PM  Result Value Ref Range   Troponin I (High Sensitivity) 16 <18 ng/L    Comment: (NOTE) Elevated high sensitivity troponin I (hsTnI) values and significant  changes across serial measurements may suggest ACS but many other  chronic and acute conditions are known to elevate hsTnI results.  Refer to the "Links"  section for chest pain algorithms and additional  guidance. Performed at [facility], [place] [address] [place], [place] [address]   Brain natriuretic peptide     Status: Abnormal   Collection Time: 06/11/22  3:22 PM  Result Value Ref Range   B Natriuretic Peptide 618.1 (H) 0.0 - 100.0 pg/mL    Comment: Performed at [facility] [address] [place], [place] [address]  Magnesium     Status: None   Collection Time: 06/11/22  3:22 PM  Result Value Ref Range   Magnesium 2.2 1.7 - 2.4 mg/dL    Comment: Performed at [facility], [place] [address],  [place], [place] [address]  T4, free     Status: Abnormal   Collection Time: 06/11/22  3:22 PM  Result Value Ref Range   Free T4 1.35 (H) 0.61 - 1.12 ng/dL    Comment: (NOTE) Biotin ingestion may interfere with free T4 tests. If the results are inconsistent with the TSH level, previous test results, or the clinical presentation, then consider biotin interference. If needed, order repeat testing after stopping biotin. Performed at [facility], [place] [address] [place], [place] [address]   TSH     Status: None   Collection Time: 06/11/22  3:24 PM  Result Value Ref Range   TSH 2.376 0.350 - 4.500 uIU/mL    Comment: Performed by a 3rd Generation assay with a functional sensitivity of <=0.01 uIU/mL. Performed at [facility], [place] [address], [place], [place] [address]    DG Chest Portable 1 View  Result Date: 06/11/2022 CLINICAL DATA:  Shortness of breath EXAM: PORTABLE CHEST 1 VIEW COMPARISON:  None Available. FINDINGS: Cardiomegaly. Streaky right basilar airspace opacity. Probable small bilateral pleural effusions. There is a ovoid 2.3 cm nodular density projecting within the right mid lung. No pneumothorax. IMPRESSION: 1. Cardiomegaly with streaky right basilar airspace opacity, which may represent atelectasis versus pneumonia. Probable small bilateral pleural effusions. 2. Ovoid 2.3 cm nodular density projecting  within the right mid lung. Consider follow-up with noncontrast CT of the chest or short interval repeat chest radiograph. Electronically Signed   By: Davina Poke D.O.   On: 06/11/2022 15:46    Pending Labs Unresulted Labs (From admission, onward)     Start     Ordered   06/18/22 0500  Creatinine, serum  (enoxaparin (LOVENOX)    CrCl >/= 30 ml/min)  Weekly,   R     Comments: while on enoxaparin therapy    06/11/22 1936   06/11/22 1937  HIV Antibody (routine testing w rflx)  (HIV Antibody (Routine testing w reflex) panel)  Once,   R        06/11/22 1936   06/11/22 1937  CBC  (enoxaparin (LOVENOX)    CrCl >/= 30 ml/min)  Once,   R       Comments: Baseline for enoxaparin therapy IF NOT ALREADY DRAWN.  Notify MD if PLT < 100 K.    06/11/22 1936   06/11/22 1937  Creatinine, serum  (enoxaparin (LOVENOX)    CrCl >/= 30 ml/min)  Once,   R       Comments: Baseline for enoxaparin therapy IF NOT ALREADY DRAWN.    06/11/22 1936            Vitals/Pain Today's Vitals   06/11/22 1800 06/11/22 1830 06/11/22 1900 06/11/22 1932  BP: (Abnormal) 114/93 (Abnormal) 110/97 98/82   Pulse: (Abnormal) 110 (Abnormal) 53 72   Resp: 16 18 (Abnormal) 24   Temp:    98 F (36.7 C)  SpO2: 100% 94% 93%   Weight:      Height:      PainSc:        Isolation Precautions No active isolations  Medications Medications  diltiazem (CARDIZEM) 1 mg/mL load via infusion 10 mg (10 mg Intravenous Bolus from Bag 06/11/22 1634)    And  diltiazem (CARDIZEM) 125 mg in dextrose 5% 125 mL (1 mg/mL) infusion (7.5 mg/hr Intravenous Rate/Dose Change 06/11/22 1833)  enoxaparin (LOVENOX) injection 40 mg (has no administration in time range)  diltiazem (CARDIZEM) injection 20 mg (20 mg Intravenous Given  06/11/22 1830)    Mobility walks     Focused Assessments Cardiac Assessment Handoff:    No results found for: "CKTOTAL", "CKMB", "CKMBINDEX", "TROPONINI" No results found for: "DDIMER" Does the Patient currently  have chest pain? No    R Recommendations: See Admitting Provider Note  Report given to:   Additional Notes:

## 2022-06-11 NOTE — ED Provider Notes (Signed)
[facility] Provider Note   CSN: [id number] Arrival date & time: 06/11/22  1455     History  Chief Complaint  Patient presents with   Shortness of Breath   Dizziness    Garrett Carroll is a 67 y.o. male.   Shortness of Breath Dizziness Associated symptoms: shortness of breath      Patient with no known medical problem presents to the emergency department due to shortness of breath.  Symptom started a few days ago, associated with nasal congestion.  No headache, chest pain.  He denies any cough or lower extremity swelling.  States he does not take any medicine for blood pressure, is never seen a cardiologist.  Was found to be tachycardic in the 150s in triage and brought back in room for evaluation.  Denies any history of atrial fibrillation or cardiac arrhythmia.  Home Medications Prior to Admission medications   Medication Sig Start Date End Date Taking? Authorizing Provider  cyclobenzaprine (FLEXERIL) 10 MG tablet Take 1 tablet (10 mg total) by mouth 3 (three) times daily as needed for muscle spasms. 10/02/18   Lamptey, Myrene Galas, MD  diphenhydrAMINE (BENADRYL) 25 MG tablet Take 1 tablet (25 mg total) by mouth every 6 (six) hours. X 3 days, then PRN itching and swelling 11/07/13   West, Emily, PA-C  naproxen (NAPROSYN) 375 MG tablet Take 1 tablet (375 mg total) by mouth 2 (two) times daily. 10/02/18   Lamptey, Myrene Galas, MD  predniSONE (STERAPRED UNI-PAK 21 TAB) 10 MG (21) TBPK tablet Take 1 tablet (10 mg total) by mouth daily. Take 6 tabs by mouth daily  for 2 days, then 5 tabs for 2 days, then 4 tabs for 2 days, then 3 tabs for 2 days, 2 tabs for 2 days, then 1 tab by mouth daily for 2 days 10/21/15   Katy Apo, NP  silver sulfADIAZINE (SILVADENE) 1 % cream Apply 1 application topically daily. 08/11/19   Wieters, Hallie C, PA-C      Allergies    Patient has no known allergies.    Review of Systems   Review of Systems  Respiratory:   Positive for shortness of breath.   Neurological:  Positive for dizziness.    Physical Exam Updated Vital Signs BP 98/82   Pulse 72   Temp 98 F (36.7 C)   Resp (!) 24   Ht '6\' 9"'$  (2.057 m)   Wt 88.5 kg   SpO2 93%   BMI 20.90 kg/m  Physical Exam Vitals and nursing note reviewed. Exam conducted with a chaperone present.  Constitutional:      Appearance: Normal appearance.  HENT:     Head: Normocephalic and atraumatic.  Eyes:     General: No scleral icterus.       Right eye: No discharge.        Left eye: No discharge.     Extraocular Movements: Extraocular movements intact.     Pupils: Pupils are equal, round, and reactive to light.  Cardiovascular:     Rate and Rhythm: Tachycardia present. Rhythm irregular.     Pulses: Normal pulses.     Heart sounds: Normal heart sounds.     No friction rub. No gallop.  Pulmonary:     Effort: Pulmonary effort is normal. No respiratory distress.     Breath sounds: Normal breath sounds.  Abdominal:     General: Abdomen is flat. Bowel sounds are normal. There is no distension.  Palpations: Abdomen is soft.     Tenderness: There is no abdominal tenderness.  Skin:    General: Skin is warm and dry.     Coloration: Skin is not jaundiced.  Neurological:     Mental Status: He is alert. Mental status is at baseline.     Coordination: Coordination normal.     ED Results / Procedures / Treatments   Labs (all labs ordered are listed, but only abnormal results are displayed) Labs Reviewed  COMPREHENSIVE METABOLIC PANEL - Abnormal; Notable for the following components:      Result Value   CO2 21 (*)    Glucose, Bld 106 (*)    Total Bilirubin 2.4 (*)    All other components within normal limits  BRAIN NATRIURETIC PEPTIDE - Abnormal; Notable for the following components:   B Natriuretic Peptide 618.1 (*)    All other components within normal limits  T4, FREE - Abnormal; Notable for the following components:   Free T4 1.35 (*)    All  other components within normal limits  RESP PANEL BY RT-PCR (RSV, FLU A&B, COVID)  RVPGX2  CBC WITH DIFFERENTIAL/PLATELET  MAGNESIUM  TSH  TSH  CBC  COMPREHENSIVE METABOLIC PANEL  MAGNESIUM  PHOSPHORUS  HIV ANTIBODY (ROUTINE TESTING W REFLEX)  LIPID PANEL  TROPONIN I (HIGH SENSITIVITY)  TROPONIN I (HIGH SENSITIVITY)    EKG EKG Interpretation  Date/Time:  Sunday June 11 2022 15:06:16 EDT Ventricular Rate:  145 PR Interval:    QRS Duration: 98 QT Interval:  336 QTC Calculation: 521 R Axis:   89 Text Interpretation: Atrial fibrillation with rapid ventricular response Minimal voltage criteria for LVH, may be normal variant ( Sokolow-Lyon ) Nonspecific T wave abnormality Abnormal ECG No previous ECGs available No old tracing to compare Confirmed by Varney Biles [phone]) on 06/11/2022 4:27:36 PM  Radiology DG Chest Portable 1 View  Result Date: 06/11/2022 CLINICAL DATA:  Shortness of breath EXAM: PORTABLE CHEST 1 VIEW COMPARISON:  None Available. FINDINGS: Cardiomegaly. Streaky right basilar airspace opacity. Probable small bilateral pleural effusions. There is a ovoid 2.3 cm nodular density projecting within the right mid lung. No pneumothorax. IMPRESSION: 1. Cardiomegaly with streaky right basilar airspace opacity, which may represent atelectasis versus pneumonia. Probable small bilateral pleural effusions. 2. Ovoid 2.3 cm nodular density projecting within the right mid lung. Consider follow-up with noncontrast CT of the chest or short interval repeat chest radiograph. Electronically Signed   By: Davina Poke D.O.   On: 06/11/2022 15:46    Procedures .Critical Care  Performed by: Sherrill Raring, PA-C Authorized by: Sherrill Raring, PA-C   Critical care provider statement:    Critical care time (minutes):  42   Critical care start time:  06/11/2022 5:30 PM   Critical care end time:  06/11/2022 6:12 PM   Critical care time was exclusive of:  Separately billable procedures and  treating other patients   Critical care was necessary to treat or prevent imminent or life-threatening deterioration of the following conditions: AF with RVR.   Critical care was time spent personally by me on the following activities:  Development of treatment plan with patient or surrogate, discussions with consultants, evaluation of patient's response to treatment, examination of patient, ordering and review of laboratory studies, ordering and review of radiographic studies, ordering and performing treatments and interventions, pulse oximetry, re-evaluation of patient's condition and review of old charts   Care discussed with: admitting provider       Medications Ordered in  ED Medications  diltiazem (CARDIZEM) 1 mg/mL load via infusion 10 mg (10 mg Intravenous Bolus from Bag 06/11/22 1634)    And  diltiazem (CARDIZEM) 125 mg in dextrose 5% 125 mL (1 mg/mL) infusion (7.5 mg/hr Intravenous Rate/Dose Change 06/11/22 1833)  enoxaparin (LOVENOX) injection 40 mg (40 mg Subcutaneous Given 06/11/22 1950)  acetaminophen (TYLENOL) tablet 650 mg (has no administration in time range)  prochlorperazine (COMPAZINE) injection 5 mg (has no administration in time range)  polyethylene glycol (MIRALAX / GLYCOLAX) packet 17 g (has no administration in time range)  melatonin tablet 5 mg (has no administration in time range)  diltiazem (CARDIZEM) injection 20 mg (20 mg Intravenous Given 06/11/22 1830)    ED Course/ Medical Decision Making/ A&P Clinical Course as of 06/11/22 1954  Sun Jun 11, 2022  1710 T4, free(!) Slightly elevated [HS]  1710 Magnesium wnl [HS]  1710 Resp panel by RT-PCR (RSV, Flu A&B, Covid) Anterior Nasal Swab Neg  [HS]  1710 CBC with Differential No leukocytosis or anemia  [HS]  1710 Comprehensive metabolic panel(!) No gross electrolyte derangement or transaminitis  [HS]  1711 TSH wnl [HS]  1711 DG Chest Portable 1 View Cardiomegaly with atelectasis versus pneumonia [HS]  1714 Brain  natriuretic peptide(!) Elevated  [HS]    Clinical Course User Index [HS] Sherrill Raring, PA-C         [id number] Score: 1                    Medical Decision Making Amount and/or Complexity of Data Reviewed Labs: ordered. Decision-making details documented in ED Course. Radiology: ordered. Decision-making details documented in ED Course.  Risk Prescription drug management. Decision regarding hospitalization.   This is a 61 presenting to the emergency department due to shortness of breath and lightheadedness.  Differential includes arrhythmia, ACS, PE, pneumonia, electrolyte derangement, AKI, viral URI.  On exam patient is clearly tachycardic with an irregular rhythm.  On cardiac monitoring he is in atrial fibrillation with RVR with a rate of 1 38-1 45.  He appears to presently comfortable, not complaining of any chest pain, headache, vision loss.  Will check broad laboratory workup, chest x-ray and cardiac monitoring.  I ordered,, viewed, and interpreted laboratory workup as documented ED course.  Patient may have some signs of heart failure such as an elevated BNP and new cardiomegaly on the chest x-ray.  The elevated BNP may just be secondary to A-fib with RVR.  I checked on the patient multiple times, he is had diltiazem load infusion but is still tachycardic in the 120s.    I considered sending the patient home but I do feel he given the persistent tachycardia would benefit from admission.  Patient is on diltiazem drip, spoke with Dr. Terri Skains with cardiology who will consult on the patient.  Spoke with hospitalist who agrees with admission.         Final Clinical Impression(s) / ED Diagnoses Final diagnoses:  Atrial fibrillation with rapid ventricular response [facility])    Rx / DC Orders ED Discharge Orders     None         Sherrill Raring, [place] 06/11/22 [place], [place], MD 06/14/22 1535

## 2022-06-11 NOTE — ED Triage Notes (Signed)
Pt reports shortness of breath and dizziness for the past 2 days. Pt also dealing with cold symptoms and allergies.

## 2022-06-11 NOTE — ED Notes (Signed)
IV pump not been working, pump switched out and dilt currently infusing. 3 different pumps say occluded when trying to bolus medication, so started drip at '10mg'$  to give higher dose up front.

## 2022-06-11 NOTE — Progress Notes (Signed)
ANTICOAGULATION CONSULT NOTE - Initial Consult  Pharmacy Consult for heparin  Indication: atrial fibrillation  No Known Allergies  Patient Measurements: Height: '6\' 9"'$  (205.7 cm) Weight: 88.5 kg (195 lb) IBW/kg (Calculated) : 98.3 Heparin Dosing Weight: 88.5  Vital Signs: Temp: 98 F (36.7 C) (03/10 1932) BP: 98/82 (03/10 1900) Pulse Rate: 72 (03/10 1900)  Labs: Recent Labs    06/11/22 1522  HGB 14.1  HCT 42.6  PLT 158  CREATININE 1.13  TROPONINIHS 16    Estimated Creatinine Clearance: 81.6 mL/min (by C-G formula based on SCr of 1.13 mg/dL).   Medical History: History reviewed. No pertinent past medical history.  Assessment: Patient admitted with CC of SOB. Found to be in Afib. No anticoagulation PTA. Did receive 1x dose of enoxaparin for dvt ppx. CBC stable. Heparin consulted to dose heparin.    Goal of Therapy:  Heparin level 0.3-0.7 units/ml Monitor platelets by anticoagulation protocol: Yes   Plan:  No bolus as patent received subq Lovenox approx 45 minutes ago.  Start heparin infusion at 1250 units/hr Check anti-Xa level in 6 hours and daily while on heparin Continue to monitor H&H and platelets  Esmeralda Arthur, PharmD, BCCCP  06/11/2022,8:26 PM

## 2022-06-12 ENCOUNTER — Inpatient Hospital Stay (HOSPITAL_COMMUNITY): Payer: BC Managed Care – PPO

## 2022-06-12 ENCOUNTER — Other Ambulatory Visit (HOSPITAL_COMMUNITY): Payer: Self-pay

## 2022-06-12 DIAGNOSIS — I5021 Acute systolic (congestive) heart failure: Secondary | ICD-10-CM | POA: Diagnosis present

## 2022-06-12 DIAGNOSIS — I428 Other cardiomyopathies: Secondary | ICD-10-CM | POA: Diagnosis present

## 2022-06-12 DIAGNOSIS — R7989 Other specified abnormal findings of blood chemistry: Secondary | ICD-10-CM | POA: Diagnosis not present

## 2022-06-12 DIAGNOSIS — Z8249 Family history of ischemic heart disease and other diseases of the circulatory system: Secondary | ICD-10-CM | POA: Diagnosis not present

## 2022-06-12 DIAGNOSIS — I493 Ventricular premature depolarization: Secondary | ICD-10-CM | POA: Diagnosis not present

## 2022-06-12 DIAGNOSIS — Z1152 Encounter for screening for COVID-19: Secondary | ICD-10-CM | POA: Diagnosis not present

## 2022-06-12 DIAGNOSIS — E872 Acidosis, unspecified: Secondary | ICD-10-CM | POA: Diagnosis present

## 2022-06-12 DIAGNOSIS — R0602 Shortness of breath: Secondary | ICD-10-CM | POA: Diagnosis present

## 2022-06-12 DIAGNOSIS — Z6820 Body mass index (BMI) 20.0-20.9, adult: Secondary | ICD-10-CM | POA: Diagnosis not present

## 2022-06-12 DIAGNOSIS — I110 Hypertensive heart disease with heart failure: Secondary | ICD-10-CM | POA: Diagnosis present

## 2022-06-12 DIAGNOSIS — I4891 Unspecified atrial fibrillation: Secondary | ICD-10-CM

## 2022-06-12 DIAGNOSIS — Z7901 Long term (current) use of anticoagulants: Secondary | ICD-10-CM | POA: Diagnosis not present

## 2022-06-12 DIAGNOSIS — E43 Unspecified severe protein-calorie malnutrition: Secondary | ICD-10-CM | POA: Diagnosis present

## 2022-06-12 DIAGNOSIS — Z79899 Other long term (current) drug therapy: Secondary | ICD-10-CM | POA: Diagnosis not present

## 2022-06-12 LAB — ECHOCARDIOGRAM COMPLETE
AR max vel: 2.6 cm2
AV Area VTI: 2.39 cm2
AV Area mean vel: 2.23 cm2
AV Mean grad: 2.7 mmHg
AV Peak grad: 4 mmHg
Ao pk vel: 1 m/s
Area-P 1/2: 6.04 cm2
Height: 81 in
S' Lateral: 5.2 cm
Weight: 3038.4 oz

## 2022-06-12 LAB — RAPID URINE DRUG SCREEN, HOSP PERFORMED
Amphetamines: NOT DETECTED
Barbiturates: NOT DETECTED
Benzodiazepines: NOT DETECTED
Cocaine: NOT DETECTED
Opiates: NOT DETECTED
Tetrahydrocannabinol: NOT DETECTED

## 2022-06-12 LAB — COMPREHENSIVE METABOLIC PANEL
ALT: 31 U/L (ref 0–44)
AST: 21 U/L (ref 15–41)
Albumin: 3.2 g/dL — ABNORMAL LOW (ref 3.5–5.0)
Alkaline Phosphatase: 51 U/L (ref 38–126)
Anion gap: 10 (ref 5–15)
BUN: 10 mg/dL (ref 8–23)
CO2: 21 mmol/L — ABNORMAL LOW (ref 22–32)
Calcium: 8.7 mg/dL — ABNORMAL LOW (ref 8.9–10.3)
Chloride: 108 mmol/L (ref 98–111)
Creatinine, Ser: 1.04 mg/dL (ref 0.61–1.24)
GFR, Estimated: >60 mL/min (ref >60)
Glucose, Bld: 91 mg/dL (ref 70–99)
Potassium: 3.8 mmol/L (ref 3.5–5.1)
Sodium: 139 mmol/L (ref 135–145)
Total Bilirubin: 2.7 mg/dL — ABNORMAL HIGH (ref 0.3–1.2)
Total Protein: 5.6 g/dL — ABNORMAL LOW (ref 6.5–8.1)

## 2022-06-12 LAB — CBC
HCT: 36.9 % — ABNORMAL LOW (ref 39.0–52.0)
Hemoglobin: 12.3 g/dL — ABNORMAL LOW (ref 13.0–17.0)
MCH: 30.1 pg (ref 26.0–34.0)
MCHC: 33.3 g/dL (ref 30.0–36.0)
MCV: 90.2 fL (ref 80.0–100.0)
Platelets: 131 10*3/uL — ABNORMAL LOW (ref 150–400)
RBC: 4.09 MIL/uL — ABNORMAL LOW (ref 4.22–5.81)
RDW: 14.6 % (ref 11.5–15.5)
WBC: 3.8 10*3/uL — ABNORMAL LOW (ref 4.0–10.5)
nRBC: 0 % (ref 0.0–0.2)

## 2022-06-12 LAB — HEMOGLOBIN A1C
Hgb A1c MFr Bld: 5.8 % — ABNORMAL HIGH (ref 4.8–5.6)
Mean Plasma Glucose: 120 mg/dL

## 2022-06-12 LAB — LIPID PANEL
Cholesterol: 121 mg/dL (ref 0–200)
HDL: 54 mg/dL (ref >40)
LDL Cholesterol: 61 mg/dL (ref 0–99)
Total CHOL/HDL Ratio: 2.2 RATIO
Triglycerides: 32 mg/dL (ref <150)
VLDL: 6 mg/dL (ref 0–40)

## 2022-06-12 LAB — LDL CHOLESTEROL, DIRECT: Direct LDL: 62 mg/dL (ref 0–99)

## 2022-06-12 LAB — HEPARIN LEVEL (UNFRACTIONATED): Heparin Unfractionated: 0.45 IU/mL (ref 0.30–0.70)

## 2022-06-12 LAB — HIV ANTIBODY (ROUTINE TESTING W REFLEX): HIV Screen 4th Generation wRfx: NONREACTIVE

## 2022-06-12 LAB — D-DIMER, QUANTITATIVE: D-Dimer, Quant: 1.32 ug/mL-FEU — ABNORMAL HIGH (ref 0.00–0.50)

## 2022-06-12 LAB — MAGNESIUM: Magnesium: 2 mg/dL (ref 1.7–2.4)

## 2022-06-12 LAB — PHOSPHORUS: Phosphorus: 4 mg/dL (ref 2.5–4.6)

## 2022-06-12 MED ORDER — DILTIAZEM HCL 60 MG PO TABS
90.0000 mg | ORAL_TABLET | Freq: Four times a day (QID) | ORAL | Status: DC
  Administered 2022-06-12: 90 mg via ORAL
  Filled 2022-06-12: qty 1

## 2022-06-12 MED ORDER — OFF THE BEAT BOOK
Freq: Once | Status: DC
  Filled 2022-06-12: qty 1

## 2022-06-12 MED ORDER — DAPAGLIFLOZIN PROPANEDIOL 10 MG PO TABS: 10.0000 mg | ORAL_TABLET | Freq: Every day | ORAL | Status: DC

## 2022-06-12 MED ORDER — AMIODARONE LOAD VIA INFUSION
150.0000 mg | Freq: Once | INTRAVENOUS | Status: AC
  Administered 2022-06-12: 150 mg via INTRAVENOUS
  Filled 2022-06-12: qty 83.34

## 2022-06-12 MED ORDER — ADULT MULTIVITAMIN W/MINERALS CH
1.0000 | ORAL_TABLET | Freq: Every day | ORAL | Status: DC
  Administered 2022-06-13: 1 via ORAL
  Filled 2022-06-12: qty 1

## 2022-06-12 MED ORDER — AMIODARONE HCL IN DEXTROSE 360-4.14 MG/200ML-% IV SOLN
60.0000 mg/h | INTRAVENOUS | Status: DC
  Administered 2022-06-12: 60 mg/h via INTRAVENOUS
  Filled 2022-06-12: qty 200

## 2022-06-12 MED ORDER — APIXABAN 5 MG PO TABS: 5.0000 mg | ORAL_TABLET | Freq: Two times a day (BID) | ORAL | Status: DC

## 2022-06-12 MED ORDER — AMIODARONE HCL IN DEXTROSE 360-4.14 MG/200ML-% IV SOLN
30.0000 mg/h | INTRAVENOUS | Status: DC
  Administered 2022-06-12 – 2022-06-13 (×2): 30 mg/h via INTRAVENOUS
  Filled 2022-06-12 (×2): qty 200

## 2022-06-12 MED ORDER — ENSURE ENLIVE PO LIQD
237.0000 mL | Freq: Three times a day (TID) | ORAL | Status: DC
  Administered 2022-06-12: 237 mL via ORAL

## 2022-06-12 MED ORDER — FUROSEMIDE 10 MG/ML IJ SOLN
40.0000 mg | Freq: Two times a day (BID) | INTRAMUSCULAR | Status: DC
  Administered 2022-06-12 – 2022-06-13 (×2): 40 mg via INTRAVENOUS
  Filled 2022-06-12 (×2): qty 4

## 2022-06-12 MED ORDER — APIXABAN 5 MG PO TABS
5.0000 mg | ORAL_TABLET | Freq: Two times a day (BID) | ORAL | Status: DC
  Administered 2022-06-12 (×2): 5 mg via ORAL
  Filled 2022-06-12 (×2): qty 1

## 2022-06-12 NOTE — Progress Notes (Signed)
Mobility Specialist Progress Note:   06/12/22 1205  Mobility  Activity Ambulated independently in hallway  Level of Assistance Modified independent, requires aide device or extra time  Assistive Device Other (Comment) (IV Pole)  Distance Ambulated (ft) 500 ft  Activity Response Tolerated well  Mobility Referral Yes  $Mobility charge 1 Mobility   Pt eager for mobility session. Audibly SOB during ambulation, SpO2 89%. Recovered to >90% with PLB and standing rest. Pt left sitting in chair with all needs met. Encouraged frequent ambulation.  Nelta Numbers Mobility Specialist Please contact via SecureChat or  Rehab office at [phone]

## 2022-06-12 NOTE — Progress Notes (Signed)
Progress Note  Patient Name: Garrett Carroll MRN: [id number] DOB: 01-Apr-1956 Date of Encounter: 06/12/2022  Attending physician: Hosie Poisson, MD Primary care provider: Iona Beard, MD  Subjective: Garrett Carroll is a 67 y.o. African-American male who was seen and examined at bedside  Resting in bed comfortably. Denies anginal chest pain or shortness of breath. Lower extremity swelling improved. Rate controlled but still in A-fib Case discussed and reviewed with his nurse.  Objective: Vital Signs in the last 24 hours: Temp:  [98 F (36.7 C)-98.2 F (36.8 C)] 98.2 F (36.8 C) (03/11 0843) Pulse Rate:  [38-110] 60 (03/11 0843) Resp:  [16-29] 20 (03/11 0843) BP: (98-114)/(73-97) 106/86 (03/11 1058) SpO2:  [90 %-100 %] 95 % (03/11 0843) Weight:  [86.1 kg-88.5 kg] 86.1 kg (03/11 0439)  Intake/Output:  Intake/Output Summary (Last 24 hours) at 06/12/2022 1231 Last data filed at 06/11/2022 2359 Gross per 24 hour  Intake 550.71 ml  Output --  Net 550.71 ml    Net IO Since Admission: 550.71 mL [06/12/22 1231]  Weights:     06/12/2022    4:39 AM 06/11/2022    8:53 PM 06/11/2022    3:09 PM  Last 3 Weights  Weight (lbs) 189 lb 14.4 oz 191 lb 195 lb  Weight (kg) 86.138 kg 86.637 kg 88.451 kg      Telemetry:  Overnight telemetry shows rate controlled A-fib, which I personally reviewed.   Physical examination: PHYSICAL EXAM: Vitals:   06/12/22 0432 06/12/22 0439 06/12/22 0843 06/12/22 1058  BP: (!) 111/92  112/80 106/86  Pulse: 65  60   Resp: 18  20   Temp: 98.2 F (36.8 C)  98.2 F (36.8 C)   TempSrc: Oral  Oral   SpO2: 90%  95%   Weight:  86.1 kg    Height:        Physical Exam  Constitutional: No distress.  Age appropriate, hemodynamically stable.   Neck: No JVD present.  Cardiovascular: Normal rate, S1 normal, S2 normal, intact distal pulses and normal pulses. An irregularly irregular rhythm present. Exam reveals no gallop, no S3 and no S4.  No murmur  heard. Pulmonary/Chest: Effort normal and breath sounds normal. No stridor. He has no wheezes. He has no rales.  Abdominal: Soft. Bowel sounds are normal. He exhibits no distension. There is no abdominal tenderness.  Musculoskeletal:        General: No edema.     Cervical back: Neck supple.  Neurological: He is alert and oriented to person, place, and time. He has intact cranial nerves (2-12).  Skin: Skin is warm and moist.    Lab Results: Chemistry Recent Labs  Lab 06/11/22 1522 06/12/22 0556  NA 140 139  K 3.9 3.8  CL 109 108  CO2 21* 21*  GLUCOSE 106* 91  BUN 12 10  CREATININE 1.13 1.04  CALCIUM 9.2 8.7*  PROT 6.5 5.6*  ALBUMIN 3.8 3.2*  AST 30 21  ALT 37 31  ALKPHOS 60 51  BILITOT 2.4* 2.7*  GFRNONAA >60 >60  ANIONGAP 10 10    Hematology Recent Labs  Lab 06/11/22 1522 06/12/22 0556  WBC 4.8 3.8*  RBC 4.65 4.09*  HGB 14.1 12.3*  HCT 42.6 36.9*  MCV 91.6 90.2  MCH 30.3 30.1  MCHC 33.1 33.3  RDW 14.7 14.6  PLT 158 131*   High Sensitivity Troponin:   Recent Labs  Lab 06/11/22 1522  TROPONINIHS 16     Cardiac EnzymesNo results for input(s): "TROPONINI" in the  last 168 hours. No results for input(s): "TROPIPOC" in the last 168 hours.  BNP Recent Labs  Lab 06/11/22 1522  BNP 618.1*    DDimer No results for input(s): "DDIMER" in the last 168 hours.  Hemoglobin A1c: No results found for: "HGBA1C", "MPG" TSH  Recent Labs    06/11/22 1524  TSH 2.376   Lipid Panel  Lab Results  Component Value Date   CHOL 121 06/12/2022   HDL 54 06/12/2022   LDLCALC 61 06/12/2022   LDLDIRECT 62 06/12/2022   TRIG 32 06/12/2022   CHOLHDL 2.2 06/12/2022   Drugs of Abuse     Component Value Date/Time   LABOPIA NONE DETECTED 06/12/2022 0457   COCAINSCRNUR NONE DETECTED 06/12/2022 0457   LABBENZ NONE DETECTED 06/12/2022 0457   AMPHETMU NONE DETECTED 06/12/2022 0457   THCU NONE DETECTED 06/12/2022 0457   LABBARB NONE DETECTED 06/12/2022 0457       Imaging: DG Chest Portable 1 View  Result Date: 06/11/2022 CLINICAL DATA:  Shortness of breath EXAM: PORTABLE CHEST 1 VIEW COMPARISON:  None Available. FINDINGS: Cardiomegaly. Streaky right basilar airspace opacity. Probable small bilateral pleural effusions. There is a ovoid 2.3 cm nodular density projecting within the right mid lung. No pneumothorax. IMPRESSION: 1. Cardiomegaly with streaky right basilar airspace opacity, which may represent atelectasis versus pneumonia. Probable small bilateral pleural effusions. 2. Ovoid 2.3 cm nodular density projecting within the right mid lung. Consider follow-up with noncontrast CT of the chest or short interval repeat chest radiograph. Electronically Signed   By: Davina Poke D.O.   On: 06/11/2022 15:46    CARDIAC DATABASE: EKG: 06/11/2022: Atrial fibrillation with rapid ventricular rate, 145 bpm, LVH per voltage criteria. 06/11/2022: Atrial fibrillation, 101 bpm, LVH per voltage criteria  Echocardiogram: Pending    Scheduled Meds:  apixaban  5 mg Oral BID   diltiazem  90 mg Oral Q6H   off the beat book   Does not apply Once    Continuous Infusions:  amiodarone 60 mg/hr (06/12/22 1154)   Followed by   amiodarone      PRN Meds: acetaminophen, melatonin, polyethylene glycol, prochlorperazine   IMPRESSION & RECOMMENDATIONS: Garrett Carroll is a 67 y.o. African-American male without any significant past medical history.   Impression: New onset of A-fib with rapid ventricular rate-ventricular rate improving. Shortness of breath-resolved. Lower extremity swelling-resolved. Elevated BNP.  Recommendations: Symptoms of shortness of breath, lower extremity swelling, and PND ongoing for the last 3 days.  Since the symptoms did not resolve she presents to the ED for further evaluation and management.  Initial EKG noted A-fib with rapid ventricular rate.  ED initiated Cardizem with the hopes of rate control strategy which may convert him  to NSR. However his rates were not well-controlled on he was admitted to medicine with cardiology consult requested.  Despite being on Cardizem drip overnight he has not converted to normal sinus rhythm.  He also received 2 doses of digoxin.  Ventricular rate has improved but he remains in A-fib.  We discussed ways of restoring normal sinus rhythm which included antiarrhythmics versus TEE guided cardioversion.  Shared decision was to start amiodarone and see if that would help convert him to sinus rhythm.  Risks, benefits, alternatives to transesophageal echocardiogram guided direct-current cardioversion were discussed.  He would like to hold off on procedure if possible.  Spoke to endoscopy the earliest TEE/cardioversion spot available is Thursday at 1 PM.  Will keep him n.p.o. for now after midnight to see if  any spots open up.  I also tried to call his wife Tamela Oddi over the phone twice but the mailbox is full.  Will try again.   For now discontinue Cardizem drip.  Restart oral Cardizem and start IV amiodarone.  Risks, benefits, and alternatives to IV amiodarone discussed with the patient.  If patient and wife refuses to undergo TEE guided cardioversion then the next best option would be rate control strategy with anticoagulation for 4 weeks.  And if he still remains in A-fib thereafter we could tentatively hold off on the TEE and proceed with only cardioversion.  Patient and family to discuss this further.  Will transition IV heparin to Eliquis.  Echocardiogram pending.  Plan of care discussed with the patient, nursing staff, attending physician.  Rex Kras, [place], [facility]  Pager:  [phone] Office: [phone]

## 2022-06-12 NOTE — Progress Notes (Signed)
[place] for heparin  Indication: atrial fibrillation Brief A/P: Heparin level within goal range Continue Heparin at current rate  No Known Allergies  Patient Measurements: Height: '6\' 9"'$  (205.7 cm) Weight: 86.1 kg (189 lb 14.4 oz) IBW/kg (Calculated) : 98.3 Heparin Dosing Weight: 88.5  Vital Signs: Temp: 98.2 F (36.8 C) (03/11 0432) Temp Source: Oral (03/11 0432) BP: 111/92 (03/11 0432) Pulse Rate: 65 (03/11 0432)  Labs: Recent Labs    06/11/22 1522 06/12/22 0556  HGB 14.1 12.3*  HCT 42.6 36.9*  PLT 158 131*  HEPARINUNFRC  --  0.45  CREATININE 1.13  --   TROPONINIHS 16  --      Estimated Creatinine Clearance: 79.4 mL/min (by C-G formula based on SCr of 1.13 mg/dL).  Assessment: 67 y.o. male with Afib for heparin  Goal of Therapy:  Heparin level 0.3-0.7 units/ml Monitor platelets by anticoagulation protocol: Yes   Plan:  No change to heparin for now F/U plan for oral anticoagulation  Phillis Knack, PharmD, BCPS  06/12/2022,6:48 AM

## 2022-06-12 NOTE — Discharge Instructions (Signed)

## 2022-06-12 NOTE — TOC Benefit Eligibility Note (Signed)
Patient Teacher, English as a foreign language completed.    The patient is currently admitted and upon discharge could be taking Eliquis 5 mg.  The current 30 day co-pay is $60.00 Can use copay cards.   The patient is currently admitted and upon discharge could be taking Xarelto 20 mg.  The current 30 day co-pay is $60.00 Can use copay cards.   The patient is insured through [employer] [place], [place] Patient [place] Patient Advocate Team Direct Number: [phone]  Fax: [phone]

## 2022-06-12 NOTE — Progress Notes (Addendum)
Initial Nutrition Assessment  DOCUMENTATION CODES:   Severe malnutrition in context of social or environmental circumstances  INTERVENTION:  Liberalize diet to regular to remove restrictions and encourage oral intake.  Provide Ensure Enlive po TID, each supplement provides 350 kcal and 20 grams of protein.  Provide multivitamin with minerals daily.  Monitor magnesium, potassium, and phosphorus daily for at least 3 days, MD to replete as needed, as pt is at risk for refeeding syndrome.  NUTRITION DIAGNOSIS:   Severe Malnutrition related to social / environmental circumstances (difficulty with eating when pt has sinus infections or very busy at work, report of decreased PO/intake, possible component of chronic illness pending medical work-up) as evidenced by moderate fat depletion, severe fat depletion, moderate muscle depletion, severe muscle depletion.  GOAL:   Patient will meet greater than or equal to 90% of their needs  MONITOR:   PO intake, Supplement acceptance, Labs, Weight trends, I & O's  REASON FOR ASSESSMENT:   Malnutrition Screening Tool    ASSESSMENT:   67 year old male with no significant PMHx who presented with shortness of breath found to have new onset A-fib with RVR, elevated BNP and peripheral edema with concern for acute CHF, elevated total bilirubin, also with incidental findings of 2.3 cm nodular density in right middle lung.  3/10: s/p ECHO; results pending  Met with pt at bedside. He reports his appetite was decreased PTA. For several days PTA he was not able to eat well due to shortness of breath. He typically has a good appetite at baseline, but reports not always eating well if he is having issues with sinus infections or if he is really busy at work (works for [employer]). Ideally he likes to eat 2-3 meals daily. However, he does report sometimes he may only eat 1-2 meals daily or may eat small portions at meals. For breakfast he has sausage, eggs,  toast, and grits. For lunch he usually has a ham or [place] sandwich. For dinner he has tilapia with green beans and mashed potatoes or hamburger with fries. Denies food allergies or intolerances. Denies nausea, emesis, or abdominal pain. Denies difficulty with chewing/swallowing. Pt reports he is eating almost 100% of meals he has received since admission. Discussed importance of adequate intake of calories and protein. Pt would benefit from diet liberalization. Pt is also amenable to drinking Ensure to help meet calorie/protein needs and he requests order for TID as he is concerned about wt loss.  Pt reports his UBW was 216 lbs. He has lost weight over time. He lost down to 199 lbs, and then continued to lose. Current wt is 86.1 kg (189.9 lbs). Pt has lost approximately 26 lbs (12% body weight) over unknown time period. Confirmed with pt that current height documented '6\' 9"'$  is correct.  Medications reviewed and include: Eliquis, Cardizem, amiodarone  Labs reviewed: CO2 21  UOP: none documented previous 24 hours  I/O: +550.7 mL since admission  NUTRITION - FOCUSED PHYSICAL EXAM:  Flowsheet Row Most Recent Value  Orbital Region Moderate depletion  Upper Arm Region Severe depletion  Thoracic and Lumbar Region Moderate depletion  Buccal Region Severe depletion  Temple Region Severe depletion  Clavicle Bone Region Severe depletion  Clavicle and Acromion Bone Region Severe depletion  Scapular Bone Region Moderate depletion  Dorsal Hand Severe depletion  Patellar Region Severe depletion  Anterior Thigh Region Moderate depletion  Posterior Calf Region Moderate depletion  Edema (RD Assessment) Mild  Hair Reviewed  Eyes Reviewed  Mouth Reviewed  [missing some dentition]  Skin Reviewed  Nails Reviewed       Diet Order:   Diet Order             Diet Heart Room service appropriate? Yes; Fluid consistency: Thin  Diet effective now                  EDUCATION NEEDS:   Education  needs have been addressed  Skin:  Skin Assessment: Reviewed RN Assessment  Last BM:  06/10/22  Height:   Ht Readings from Last 1 Encounters:  06/11/22 '6\' 9"'$  (2.057 m)   Weight:   Wt Readings from Last 1 Encounters:  06/12/22 86.1 kg   BMI:  Body mass index is 20.35 kg/m.  Estimated Nutritional Needs:   Kcal:  2150-2350  Protein:  105-115 grams  Fluid:  2.1-2.3 L/day  Loanne Drilling, MS, RD, LDN, CNSC Pager number available on Amion

## 2022-06-12 NOTE — Progress Notes (Signed)
[facility]                                                                               Garrett Carroll, is a 67 y.o. male, DOB - 1955/04/10, [id number] Admit date - 06/11/2022    Outpatient Primary MD for the patient is Iona Beard, MD  LOS - 0  days    Brief summary   Garrett Carroll is a 67 y.o. male with no significant past medical history who presented to [facility] ED with complaints of shortness of breath over 2 days duration.  Associated with nasal congestion.  No chest pain, or cough.  Has swelling in his legs.  No use of tobacco.  Rarely drinks alcohol, maybe on holidays.  He presented to the ED for further evaluation.he was found to be in afib with RVR and started on cardizem gtt.  Cardiology consulted and echocardiogram ordered.    Assessment & Plan    Assessment and Plan:   New onset atrial fib with RVR.  On IV amiodarone gttt.  Tsh.  Cardiology consulted.  IV heparin started.  He will probably need TEE guided cardioversion.     New onset systolic heart failure:  Started on IV lasix. Strict intake and output, daily weights.  Echocardiogram showed. Left ventricular ejection fraction, by estimation, is 20 to 25%. The  left ventricle has severely decreased function. The left ventricle  demonstrates global hypokinesis. The left ventricular internal cavity size  was moderately dilated. Left  ventricular diastolic function could not be evaluated.   Right ventricular systolic function is severely reduced. The right  ventricular size is severely enlarged.   Incidental findings: Ovoid 2.3 cm nodular density projecting within the right middle lung.  Consider follow-up with noncontrast CT of the chest or short interval repeat chest radiograph   Estimated body mass index is 20.35 kg/m as calculated from the following:   Height as of this encounter: '6\' 9"'$  (2.057 m).   Weight as of this encounter: 86.1 kg.  Code Status: full code.  DVT Prophylaxis:    apixaban (ELIQUIS) tablet 5 mg   Level of Care: Level of care: Telemetry Medical Family Communication: none at bedside.   Disposition Plan:     Remains inpatient appropriate:  atrial fib.   Procedures:  Echocardiogram.   Consultants:   Cardiology.   Antimicrobials:   Anti-infectives (From admission, onward)    None        Medications  Scheduled Meds:  apixaban  5 mg Oral BID   diltiazem  90 mg Oral Q6H   off the beat book   Does not apply Once   Continuous Infusions:  amiodarone 30 mg/hr (06/12/22 1647)   PRN Meds:.acetaminophen, melatonin, polyethylene glycol, prochlorperazine    Subjective:   Garrett Carroll was seen and examined today.  No chest pain. Sob improved.   Objective:   Vitals:   06/12/22 0432 06/12/22 0439 06/12/22 0843 06/12/22 1058  BP: (!) 111/92  112/80 106/86  Pulse: 65  60   Resp: 18  20   Temp: 98.2 F (36.8 C)  98.2 F (36.8 C)   TempSrc: Oral  Oral   SpO2: 90%  95%   Weight:  86.1 kg    Height:        Intake/Output Summary (Last 24 hours) at 06/12/2022 1652 Last data filed at 06/11/2022 2359 Gross per 24 hour  Intake 550.71 ml  Output --  Net 550.71 ml   Filed Weights   06/11/22 1509 06/11/22 2053 06/12/22 0439  Weight: 88.5 kg 86.6 kg 86.1 kg     Exam General exam: Appears calm and comfortable  Respiratory system: Clear to auscultation. Respiratory effort normal. Cardiovascular system: S1 & S2 heard, RRR. No JVD, Gastrointestinal system: Abdomen is nondistended, soft and nontender.  Central nervous system: Alert and oriented. No focal neurological deficits. Extremities: Symmetric 5 x 5 power. Skin: No rashes,  Psychiatry: Mood & affect appropriate.     Data Reviewed:  I have personally reviewed following labs and imaging studies   CBC Lab Results  Component Value Date   WBC 3.8 (L) 06/12/2022   RBC 4.09 (L) 06/12/2022   HGB 12.3 (L) 06/12/2022   HCT 36.9 (L) 06/12/2022   MCV 90.2 06/12/2022   MCH 30.1  06/12/2022   PLT 131 (L) 06/12/2022   MCHC 33.3 06/12/2022   RDW 14.6 06/12/2022   LYMPHSABS 1.4 06/11/2022   MONOABS 0.5 06/11/2022   EOSABS 0.0 06/11/2022   BASOSABS 0.1 [id number]     Last metabolic panel Lab Results  Component Value Date   NA 139 06/12/2022   K 3.8 06/12/2022   CL 108 06/12/2022   CO2 21 (L) 06/12/2022   BUN 10 06/12/2022   CREATININE 1.04 06/12/2022   GLUCOSE 91 06/12/2022   GFRNONAA >60 06/12/2022   CALCIUM 8.7 (L) 06/12/2022   PHOS 4.0 06/12/2022   PROT 5.6 (L) 06/12/2022   ALBUMIN 3.2 (L) 06/12/2022   BILITOT 2.7 (H) 06/12/2022   ALKPHOS 51 06/12/2022   AST 21 06/12/2022   ALT 31 06/12/2022   ANIONGAP 10 06/12/2022    CBG (last 3)  No results for input(s): "GLUCAP" in the last 72 hours.    Coagulation Profile: No results for input(s): "INR", "PROTIME" in the last 168 hours.   Radiology Studies: DG Chest Portable 1 View  Result Date: 06/11/2022 CLINICAL DATA:  Shortness of breath EXAM: PORTABLE CHEST 1 VIEW COMPARISON:  None Available. FINDINGS: Cardiomegaly. Streaky right basilar airspace opacity. Probable small bilateral pleural effusions. There is a ovoid 2.3 cm nodular density projecting within the right mid lung. No pneumothorax. IMPRESSION: 1. Cardiomegaly with streaky right basilar airspace opacity, which may represent atelectasis versus pneumonia. Probable small bilateral pleural effusions. 2. Ovoid 2.3 cm nodular density projecting within the right mid lung. Consider follow-up with noncontrast CT of the chest or short interval repeat chest radiograph. Electronically Signed   By: Davina Poke D.O.   On: 06/11/2022 15:46       Hosie Poisson M.D. [facility] 06/12/2022, 4:52 PM  Available via Epic secure chat 7am-7pm After 7 pm, please refer to night coverage provider listed on amion.

## 2022-06-13 ENCOUNTER — Other Ambulatory Visit (HOSPITAL_COMMUNITY): Payer: Self-pay

## 2022-06-13 DIAGNOSIS — E43 Unspecified severe protein-calorie malnutrition: Secondary | ICD-10-CM

## 2022-06-13 LAB — CBC
HCT: 37.4 % — ABNORMAL LOW (ref 39.0–52.0)
Hemoglobin: 12.3 g/dL — ABNORMAL LOW (ref 13.0–17.0)
MCH: 29.9 pg (ref 26.0–34.0)
MCHC: 32.9 g/dL (ref 30.0–36.0)
MCV: 91 fL (ref 80.0–100.0)
Platelets: 143 10*3/uL — ABNORMAL LOW (ref 150–400)
RBC: 4.11 MIL/uL — ABNORMAL LOW (ref 4.22–5.81)
RDW: 14.5 % (ref 11.5–15.5)
WBC: 4.1 10*3/uL (ref 4.0–10.5)
nRBC: 0 % (ref 0.0–0.2)

## 2022-06-13 LAB — BASIC METABOLIC PANEL
Anion gap: 8 (ref 5–15)
BUN: 13 mg/dL (ref 8–23)
CO2: 26 mmol/L (ref 22–32)
Calcium: 8.6 mg/dL — ABNORMAL LOW (ref 8.9–10.3)
Chloride: 106 mmol/L (ref 98–111)
Creatinine, Ser: 1.21 mg/dL (ref 0.61–1.24)
GFR, Estimated: >60 mL/min (ref >60)
Glucose, Bld: 109 mg/dL — ABNORMAL HIGH (ref 70–99)
Potassium: 3.9 mmol/L (ref 3.5–5.1)
Sodium: 140 mmol/L (ref 135–145)

## 2022-06-13 LAB — MAGNESIUM: Magnesium: 2.1 mg/dL (ref 1.7–2.4)

## 2022-06-13 LAB — PHOSPHORUS: Phosphorus: 4.5 mg/dL (ref 2.5–4.6)

## 2022-06-13 SURGERY — RIGHT/LEFT HEART CATH AND CORONARY ANGIOGRAPHY
Anesthesia: LOCAL

## 2022-06-13 MED ORDER — FUROSEMIDE 20 MG PO TABS: 20.0000 mg | ORAL_TABLET | Freq: Every day | ORAL | 11 refills | Status: DC

## 2022-06-13 MED ORDER — SODIUM CHLORIDE 0.9 % IV SOLN: INTRAVENOUS | Status: DC

## 2022-06-13 MED ORDER — ADULT MULTIVITAMIN W/MINERALS CH: 1.0000 | ORAL_TABLET | Freq: Every day | ORAL | Status: DC

## 2022-06-13 MED ORDER — FUROSEMIDE 20 MG PO TABS
20.0000 mg | ORAL_TABLET | Freq: Every day | ORAL | 11 refills | Status: DC
  Filled 2022-06-13: qty 30, 30d supply, fill #0

## 2022-06-13 MED ORDER — ASPIRIN 81 MG PO CHEW: 81.0000 mg | CHEWABLE_TABLET | ORAL | Status: DC

## 2022-06-13 MED ORDER — METOPROLOL SUCCINATE ER 50 MG PO TB24: 50.0000 mg | ORAL_TABLET | Freq: Every day | ORAL | 3 refills | Status: DC

## 2022-06-13 MED ORDER — ENSURE ENLIVE PO LIQD: 237.0000 mL | Freq: Three times a day (TID) | ORAL | 2 refills | Status: AC

## 2022-06-13 MED ORDER — EMPAGLIFLOZIN 10 MG PO TABS: 10.0000 mg | ORAL_TABLET | Freq: Every day | ORAL | 2 refills | Status: DC

## 2022-06-13 MED ORDER — AMIODARONE HCL 200 MG PO TABS
200.0000 mg | ORAL_TABLET | Freq: Two times a day (BID) | ORAL | Status: DC
  Administered 2022-06-13: 200 mg via ORAL
  Filled 2022-06-13: qty 1

## 2022-06-13 MED ORDER — METOPROLOL SUCCINATE ER 50 MG PO TB24
50.0000 mg | ORAL_TABLET | Freq: Every day | ORAL | Status: DC
  Administered 2022-06-13: 50 mg via ORAL
  Filled 2022-06-13: qty 1

## 2022-06-13 MED ORDER — EMPAGLIFLOZIN 10 MG PO TABS
10.0000 mg | ORAL_TABLET | Freq: Every day | ORAL | 2 refills | Status: DC
  Filled 2022-06-13: qty 30, 30d supply, fill #0

## 2022-06-13 MED ORDER — EMPAGLIFLOZIN 10 MG PO TABS
10.0000 mg | ORAL_TABLET | Freq: Every day | ORAL | Status: DC
  Administered 2022-06-13: 10 mg via ORAL
  Filled 2022-06-13: qty 1

## 2022-06-13 MED ORDER — AMIODARONE HCL 200 MG PO TABS: 200.0000 mg | ORAL_TABLET | Freq: Two times a day (BID) | ORAL | 2 refills | Status: DC

## 2022-06-13 MED ORDER — SACUBITRIL-VALSARTAN 24-26 MG PO TABS: 1.0000 | ORAL_TABLET | Freq: Two times a day (BID) | ORAL | 2 refills | Status: DC

## 2022-06-13 MED ORDER — AMIODARONE HCL 200 MG PO TABS
200.0000 mg | ORAL_TABLET | Freq: Two times a day (BID) | ORAL | 2 refills | Status: DC
  Filled 2022-06-13: qty 60, 30d supply, fill #0

## 2022-06-13 MED ORDER — METOPROLOL SUCCINATE ER 50 MG PO TB24
50.0000 mg | ORAL_TABLET | Freq: Every day | ORAL | 3 refills | Status: DC
  Filled 2022-06-13: qty 30, 30d supply, fill #0

## 2022-06-13 MED ORDER — SODIUM CHLORIDE 0.9% FLUSH
3.0000 mL | Freq: Two times a day (BID) | INTRAVENOUS | Status: DC
  Administered 2022-06-13: 3 mL via INTRAVENOUS

## 2022-06-13 MED ORDER — SACUBITRIL-VALSARTAN 24-26 MG PO TABS
1.0000 | ORAL_TABLET | Freq: Two times a day (BID) | ORAL | Status: DC
  Administered 2022-06-13: 1 via ORAL
  Filled 2022-06-13: qty 1

## 2022-06-13 MED ORDER — SACUBITRIL-VALSARTAN 24-26 MG PO TABS
1.0000 | ORAL_TABLET | Freq: Two times a day (BID) | ORAL | 2 refills | Status: DC
  Filled 2022-06-13: qty 60, 30d supply, fill #0

## 2022-06-13 MED ORDER — ENSURE ENLIVE PO LIQD
237.0000 mL | Freq: Three times a day (TID) | ORAL | 12 refills | Status: DC
  Filled 2022-06-13: qty 237, 1d supply, fill #0

## 2022-06-13 NOTE — Progress Notes (Addendum)
Subjective:  Patient feels much better.  Dyspnea has improved.  He is able to lay down flat. Intake/Output from previous day:  I/O last 3 completed shifts: In: 1030.7 [P.O.:960; I.V.:70.7] Out: 1400 [Urine:1400] No intake/output data recorded. Net IO Since Admission: -369.29 mL [06/13/22 0708]  Blood pressure (!) 155/119, pulse 74, temperature (!) 97.5 F (36.4 C), resp. rate 18, height '6\' 9"'$  (2.057 m), weight 82.3 kg, SpO2 100 %. Physical Exam Neck:     Vascular: JVD present. No carotid bruit.  Cardiovascular:     Rate and Rhythm: Normal rate. Rhythm irregularly irregular.     Pulses: Intact distal pulses.     Heart sounds: Normal heart sounds. No murmur heard.    No gallop.  Pulmonary:     Effort: Pulmonary effort is normal.     Breath sounds: Examination of the right-lower field reveals rales. Examination of the left-lower field reveals rales. Rales present.  Abdominal:     General: Bowel sounds are normal.     Palpations: Abdomen is soft.  Musculoskeletal:     Right lower leg: No edema.     Left lower leg: No edema.     Lab Results: Lab Results  Component Value Date   NA 140 06/13/2022   K 3.9 06/13/2022   CO2 26 06/13/2022   GLUCOSE 109 (H) 06/13/2022   BUN 13 06/13/2022   CREATININE 1.21 06/13/2022   CALCIUM 8.6 (L) 06/13/2022   GFRNONAA >60 06/13/2022    BNP (last 3 results) Recent Labs    06/11/22 1522  BNP 618.1*       Latest Ref Rng & Units 06/13/2022    1:44 AM 06/12/2022    5:56 AM 06/11/2022    3:22 PM  BMP  Glucose 70 - 99 mg/dL 109  91  106   BUN 8 - 23 mg/dL '13  10  12   '$ Creatinine 0.61 - 1.24 mg/dL 1.21  1.04  1.13   Sodium 135 - 145 mmol/L 140  139  140   Potassium 3.5 - 5.1 mmol/L 3.9  3.8  3.9   Chloride 98 - 111 mmol/L 106  108  109   CO2 22 - 32 mmol/L '26  21  21   '$ Calcium 8.9 - 10.3 mg/dL 8.6  8.7  9.2       Latest Ref Rng & Units 06/12/2022    5:56 AM 06/11/2022    3:22 PM  Hepatic Function  Total Protein 6.5 - 8.1 g/dL 5.6  6.5    Albumin 3.5 - 5.0 g/dL 3.2  3.8   AST 15 - 41 U/L 21  30   ALT 0 - 44 U/L 31  37   Alk Phosphatase 38 - 126 U/L 51  60   Total Bilirubin 0.3 - 1.2 mg/dL 2.7  2.4       Latest Ref Rng & Units 06/13/2022    1:44 AM 06/12/2022    5:56 AM 06/11/2022    3:22 PM  CBC  WBC 4.0 - 10.5 K/uL 4.1  3.8  4.8   Hemoglobin 13.0 - 17.0 g/dL 12.3  12.3  14.1   Hematocrit 39.0 - 52.0 % 37.4  36.9  42.6   Platelets 150 - 400 K/uL 143  131  158    Lipid Panel     Component Value Date/Time   CHOL 121 06/12/2022 0556   TRIG 32 06/12/2022 0556   HDL 54 06/12/2022 0556   CHOLHDL 2.2 06/12/2022 0556  VLDL 6 06/12/2022 0556   LDLCALC 61 06/12/2022 0556   LDLDIRECT 62 06/12/2022 0556   HEMOGLOBIN A1C Lab Results  Component Value Date   HGBA1C 5.8 (H) 06/12/2022   MPG 120 06/12/2022   TSH Recent Labs    06/11/22 1524  TSH 2.376   Imaging: DG Chest Portable 1 View 06/11/2022  1. Cardiomegaly with streaky right basilar airspace opacity, which may represent atelectasis versus pneumonia. Probable small bilateral pleural effusions.  2. Ovoid 2.3 cm nodular density projecting within the right mid lung. Consider follow-up with noncontrast CT of the chest or short interval repeat chest radiograph. Electronically Signed   By: Davina Poke D.O.   On: 06/11/2022 15:46    Cardiac Studies:  EKG:   EKG 05/14/2022 atrial fibrillation with controlled ventricular response at rate of 83 bpm, normal axis, LVH.  Nonspecific T abnormality.  Compared to 06/11/2022, A-fib with RVR now replaced by controlled ventricular rate.  Nonspecific T abnormality  Echocardiogram 06/12/2022:   1. Left ventricular ejection fraction, by estimation, is 20 to 25%. The left ventricle has severely decreased function. The left ventricle demonstrates global hypokinesis. The left ventricular internal cavity size was moderately dilated. Left  ventricular diastolic function could not be evaluated.  2. Right ventricular systolic  function is severely reduced. The right ventricular size is severely enlarged.  3. Left atrial size was severely dilated.  4. Right atrial size was severely dilated.  5. The mitral valve is normal in structure. Severe mitral valve regurgitation. No evidence of mitral stenosis.  6. Tricuspid valve regurgitation is severe.  7. The aortic valve is normal in structure. Aortic valve regurgitation is not visualized. No aortic stenosis is present.  8. Aortic dilatation noted. There is borderline dilatation of the aortic root, measuring 39 mm. There is borderline dilatation of the ascending aorta, measuring 38 mm.  9. The inferior vena cava is dilated in size with <50% respiratory variability, suggesting right atrial pressure of 15 mmHg.   Scheduled Meds:  feeding supplement  237 mL Oral TID BM   furosemide  40 mg Intravenous Q12H   multivitamin with minerals  1 tablet Oral Daily   off the beat book   Does not apply Once   Continuous Infusions:  amiodarone 30 mg/hr (06/13/22 0205)   PRN Meds:.acetaminophen, melatonin, polyethylene glycol, prochlorperazine  Assessment  Garrett Carroll is a 67 y.o. male patient with no significant cardiovascular history, with sudden onset worsening dyspnea and acute decompensated heart failure, suspect he probably had atrial fibrillation with RVR ongoing for a while leading to development of cardiomyopathy.  1.  Atrial fibrillation with rapid ventricular response, presently on IV amiodarone, heart rate has improved.  Telemetry reviewed, patient has had occasional episodes of PVCs today on 06/13/2022 but maintains relatively good rate control. 2.  Acute systolic heart failure, suspect nonischemic cardiomyopathy. 3.  Primary hypertension, patient not on any medical therapy previous to this.  Patient continues to have markedly elevated blood pressure.  Plan:   I will start him on metoprolol succinate 50 mg twice daily,  Entresto 24/26 mg twice daily and transition him  to oral amiodarone.  Patient will need ischemic workup, as his heart failure symptoms have improved significantly, will go ahead and set him up for right and left heart catheterization as previously planned.  If cardiac catheterization does not reveal significant coronary disease, we could potentially plan on discharging you home tomorrow with plans of continued anticoagulation and cardioversion at a later date.  Will  continue IV heparin for now and transition him to oral Eliquis 5 mg twice daily postcardiac catheterization.  Continue IV diuretics with Lasix today and transition him to oral diuretics tomorrow.  I will also start him on Jardiance 10 mg daily.  I's and O's are not accurate, this morning in the last 1 shift he has had 2400 cc of urinary output.  Schedule for cardiac catheterization, and possible angioplasty. We discussed regarding risks, benefits, alternatives to this including stress testing, CTA and continued medical therapy. Patient wants to proceed. Understands <1-2% risk of death, stroke, MI, urgent CABG, bleeding, infection, renal failure but not limited to these.    Adrian Prows, MD, [facility] 06/13/2022, 7:08 AM Office: [phone] Fax: [phone] Pager: [phone]

## 2022-06-13 NOTE — Progress Notes (Signed)
Mobility Specialist Progress Note:   06/13/22 1030  Mobility  Activity Ambulated independently in hallway  Level of Assistance Independent  Assistive Device None  Distance Ambulated (ft) 500 ft  Activity Response Tolerated well  Mobility Referral Yes  $Mobility charge 1 Mobility   Pt agreeable to mobility session. SpO2 WFL on RA throughout ambulation, no SOB noted. Pt back in bed with all needs met, anxious about procedure today.  Nelta Numbers Mobility Specialist Please contact via SecureChat or  Rehab office at [phone]

## 2022-06-13 NOTE — H&P (View-Only) (Signed)
Subjective:  Patient feels much better.  Dyspnea has improved.  He is able to lay down flat. Intake/Output from previous day:  I/O last 3 completed shifts: In: 1030.7 [P.O.:960; I.V.:70.7] Out: 1400 [Urine:1400] No intake/output data recorded. Net IO Since Admission: -369.29 mL [06/13/22 0708]  Blood pressure (!) 155/119, pulse 74, temperature (!) 97.5 F (36.4 C), resp. rate 18, height '6\' 9"'$  (2.057 m), weight 82.3 kg, SpO2 100 %. Physical Exam Neck:     Vascular: JVD present. No carotid bruit.  Cardiovascular:     Rate and Rhythm: Normal rate. Rhythm irregularly irregular.     Pulses: Intact distal pulses.     Heart sounds: Normal heart sounds. No murmur heard.    No gallop.  Pulmonary:     Effort: Pulmonary effort is normal.     Breath sounds: Examination of the right-lower field reveals rales. Examination of the left-lower field reveals rales. Rales present.  Abdominal:     General: Bowel sounds are normal.     Palpations: Abdomen is soft.  Musculoskeletal:     Right lower leg: No edema.     Left lower leg: No edema.     Lab Results: Lab Results  Component Value Date   NA 140 06/13/2022   K 3.9 06/13/2022   CO2 26 06/13/2022   GLUCOSE 109 (H) 06/13/2022   BUN 13 06/13/2022   CREATININE 1.21 06/13/2022   CALCIUM 8.6 (L) 06/13/2022   GFRNONAA >60 06/13/2022    BNP (last 3 results) Recent Labs    06/11/22 1522  BNP 618.1*       Latest Ref Rng & Units 06/13/2022    1:44 AM 06/12/2022    5:56 AM 06/11/2022    3:22 PM  BMP  Glucose 70 - 99 mg/dL 109  91  106   BUN 8 - 23 mg/dL '13  10  12   '$ Creatinine 0.61 - 1.24 mg/dL 1.21  1.04  1.13   Sodium 135 - 145 mmol/L 140  139  140   Potassium 3.5 - 5.1 mmol/L 3.9  3.8  3.9   Chloride 98 - 111 mmol/L 106  108  109   CO2 22 - 32 mmol/L '26  21  21   '$ Calcium 8.9 - 10.3 mg/dL 8.6  8.7  9.2       Latest Ref Rng & Units 06/12/2022    5:56 AM 06/11/2022    3:22 PM  Hepatic Function  Total Protein 6.5 - 8.1 g/dL 5.6  6.5    Albumin 3.5 - 5.0 g/dL 3.2  3.8   AST 15 - 41 U/L 21  30   ALT 0 - 44 U/L 31  37   Alk Phosphatase 38 - 126 U/L 51  60   Total Bilirubin 0.3 - 1.2 mg/dL 2.7  2.4       Latest Ref Rng & Units 06/13/2022    1:44 AM 06/12/2022    5:56 AM 06/11/2022    3:22 PM  CBC  WBC 4.0 - 10.5 K/uL 4.1  3.8  4.8   Hemoglobin 13.0 - 17.0 g/dL 12.3  12.3  14.1   Hematocrit 39.0 - 52.0 % 37.4  36.9  42.6   Platelets 150 - 400 K/uL 143  131  158    Lipid Panel     Component Value Date/Time   CHOL 121 06/12/2022 0556   TRIG 32 06/12/2022 0556   HDL 54 06/12/2022 0556   CHOLHDL 2.2 06/12/2022 0556  VLDL 6 06/12/2022 0556   LDLCALC 61 06/12/2022 0556   LDLDIRECT 62 06/12/2022 0556   HEMOGLOBIN A1C Lab Results  Component Value Date   HGBA1C 5.8 (H) 06/12/2022   MPG 120 06/12/2022   TSH Recent Labs    06/11/22 1524  TSH 2.376   Imaging: DG Chest Portable 1 View 06/11/2022  1. Cardiomegaly with streaky right basilar airspace opacity, which may represent atelectasis versus pneumonia. Probable small bilateral pleural effusions.  2. Ovoid 2.3 cm nodular density projecting within the right mid lung. Consider follow-up with noncontrast CT of the chest or short interval repeat chest radiograph. Electronically Signed   By: Davina Poke D.O.   On: 06/11/2022 15:46    Cardiac Studies:  EKG:   EKG 05/14/2022 atrial fibrillation with controlled ventricular response at rate of 83 bpm, normal axis, LVH.  Nonspecific T abnormality.  Compared to 06/11/2022, A-fib with RVR now replaced by controlled ventricular rate.  Nonspecific T abnormality  Echocardiogram 06/12/2022:   1. Left ventricular ejection fraction, by estimation, is 20 to 25%. The left ventricle has severely decreased function. The left ventricle demonstrates global hypokinesis. The left ventricular internal cavity size was moderately dilated. Left  ventricular diastolic function could not be evaluated.  2. Right ventricular systolic  function is severely reduced. The right ventricular size is severely enlarged.  3. Left atrial size was severely dilated.  4. Right atrial size was severely dilated.  5. The mitral valve is normal in structure. Severe mitral valve regurgitation. No evidence of mitral stenosis.  6. Tricuspid valve regurgitation is severe.  7. The aortic valve is normal in structure. Aortic valve regurgitation is not visualized. No aortic stenosis is present.  8. Aortic dilatation noted. There is borderline dilatation of the aortic root, measuring 39 mm. There is borderline dilatation of the ascending aorta, measuring 38 mm.  9. The inferior vena cava is dilated in size with <50% respiratory variability, suggesting right atrial pressure of 15 mmHg.   Scheduled Meds:  feeding supplement  237 mL Oral TID BM   furosemide  40 mg Intravenous Q12H   multivitamin with minerals  1 tablet Oral Daily   off the beat book   Does not apply Once   Continuous Infusions:  amiodarone 30 mg/hr (06/13/22 0205)   PRN Meds:.acetaminophen, melatonin, polyethylene glycol, prochlorperazine  Assessment  Garrett Carroll is a 67 y.o. male patient with no significant cardiovascular history, with sudden onset worsening dyspnea and acute decompensated heart failure, suspect he probably had atrial fibrillation with RVR ongoing for a while leading to development of cardiomyopathy.  1.  Atrial fibrillation with rapid ventricular response, presently on IV amiodarone, heart rate has improved.  Telemetry reviewed, patient has had occasional episodes of PVCs today on 06/13/2022 but maintains relatively good rate control. 2.  Acute systolic heart failure, suspect nonischemic cardiomyopathy. 3.  Primary hypertension, patient not on any medical therapy previous to this.  Patient continues to have markedly elevated blood pressure.  Plan:   I will start him on metoprolol succinate 50 mg twice daily,  Entresto 24/26 mg twice daily and transition him  to oral amiodarone.  Patient will need ischemic workup, as his heart failure symptoms have improved significantly, will go ahead and set him up for right and left heart catheterization as previously planned.  If cardiac catheterization does not reveal significant coronary disease, we could potentially plan on discharging you home tomorrow with plans of continued anticoagulation and cardioversion at a later date.  Will  continue IV heparin for now and transition him to oral Eliquis 5 mg twice daily postcardiac catheterization.  Continue IV diuretics with Lasix today and transition him to oral diuretics tomorrow.  I will also start him on Jardiance 10 mg daily.  I's and O's are not accurate, this morning in the last 1 shift he has had 2400 cc of urinary output.  Schedule for cardiac catheterization, and possible angioplasty. We discussed regarding risks, benefits, alternatives to this including stress testing, CTA and continued medical therapy. Patient wants to proceed. Understands <1-2% risk of death, stroke, MI, urgent CABG, bleeding, infection, renal failure but not limited to these.    Adrian Prows, MD, [facility] 06/13/2022, 7:08 AM Office: [phone] Fax: [phone] Pager: [phone]

## 2022-06-13 NOTE — Progress Notes (Incomplete)
[facility]                                                                               Garrett Carroll, is a 67 y.o. male, DOB - February 10, 1956, [id number] Admit date - 06/11/2022    Outpatient Primary MD for the patient is Iona Beard, MD  LOS - 1  days    Brief summary   [name] is a 67 y.o. male with no significant past medical history who presented to [facility] ED with complaints of shortness of breath over 2 days duration.  Associated with nasal congestion.  No chest pain, or cough.  Has swelling in his legs.  No use of tobacco.  Rarely drinks alcohol, maybe on holidays.  He presented to the ED for further evaluation.he was found to be in afib with RVR and started on cardizem gtt.  Cardiology consulted and echocardiogram ordered, showed LVef 20 TO 25%, severely decreased function, global hypokinesis,. RV systolic function is severely reduced and enlarged.    Assessment & Plan    New onset atrial fib with RVR.  On IV amiodarone gttt.  Tsh.  Cardiology consulted.  IV heparin started.  He will probably need TEE guided cardioversion.     New onset systolic heart failure:  Started on IV lasix 40 mg IV BID. Marland Kitchen Strict intake and output, daily weights.  Echocardiogram showed. Left ventricular ejection fraction, by estimation, is 20 to 25%. The  left ventricle has severely decreased function. The left ventricle  demonstrates global hypokinesis. The left ventricular internal cavity size  was moderately dilated. Left  ventricular diastolic function could not be evaluated.   Right ventricular systolic function is severely reduced. The right  ventricular size is severely enlarged.   Incidental findings: Ovoid 2.3 cm nodular density projecting within the right middle lung.  Consider follow-up with noncontrast CT of the chest or short interval repeat chest radiograph   Estimated body mass index is 19.45 kg/m as calculated from the following:   Height as of this  encounter: '6\' 9"'$  (2.057 m).   Weight as of this encounter: 82.3 kg.  Code Status: full code.  DVT Prophylaxis:     Level of Care: Level of care: Telemetry Medical Family Communication: none at bedside.   Disposition Plan:     Remains inpatient appropriate:  atrial fib.   Procedures:  Echocardiogram.   Consultants:   Cardiology.   Antimicrobials:   Anti-infectives (From admission, onward)    None        Medications  Scheduled Meds:  amiodarone  200 mg Oral BID   aspirin  81 mg Oral Pre-Cath   empagliflozin  10 mg Oral Daily   feeding supplement  237 mL Oral TID BM   furosemide  40 mg Intravenous Q12H   metoprolol succinate  50 mg Oral Daily   multivitamin with minerals  1 tablet Oral Daily   off the beat book   Does not apply Once   sacubitril-valsartan  1 tablet Oral BID   sodium chloride flush  3 mL Intravenous Q12H   Continuous Infusions:  sodium chloride     PRN Meds:.acetaminophen, melatonin, polyethylene glycol, prochlorperazine  Subjective:   [name] was seen and examined today.  No chest pain. Sob improved.   Objective:   Vitals:   06/12/22 1058 06/12/22 1726 06/12/22 1954 06/13/22 0632  BP: 106/86 96/84 (!) 155/119   Pulse:  88 74   Resp:  18 18   Temp:  (!) 97.3 F (36.3 C) 98.3 F (36.8 C) (!) 97.5 F (36.4 C)  TempSrc:  Oral Oral   SpO2:   100%   Weight:    82.3 kg  Height:        Intake/Output Summary (Last 24 hours) at 06/13/2022 1500 Last data filed at 06/13/2022 0839 Gross per 24 hour  Intake 480 ml  Output 3700 ml  Net -3220 ml    Filed Weights   06/11/22 2053 06/12/22 0439 06/13/22 [id number]  Weight: 86.6 kg 86.1 kg 82.3 kg     Exam General exam: Appears calm and comfortable ON RA.  Respiratory system: Clear to auscultation. Respiratory effort normal. Cardiovascular system: S1 & S2 heard, irregularly irregular,pedal edema resolved.  Gastrointestinal system: Abdomen is nondistended, soft and nontender. No  organomegaly or masses felt.  Central nervous system: Alert and oriented. No focal neurological deficits. Extremities: Symmetric 5 x 5 power. Skin: No rashes Psychiatry:  Mood & affect appropriate.     Data Reviewed:  I have personally reviewed following labs and imaging studies   CBC Lab Results  Component Value Date   WBC 4.1 06/13/2022   RBC 4.11 (L) 06/13/2022   HGB 12.3 (L) 06/13/2022   HCT 37.4 (L) 06/13/2022   MCV 91.0 06/13/2022   MCH 29.9 06/13/2022   PLT 143 (L) 06/13/2022   MCHC 32.9 06/13/2022   RDW 14.5 06/13/2022   LYMPHSABS 1.4 06/11/2022   MONOABS 0.5 06/11/2022   EOSABS 0.0 06/11/2022   BASOSABS 0.1 [id number]     Last metabolic panel Lab Results  Component Value Date   NA 140 06/13/2022   K 3.9 06/13/2022   CL 106 06/13/2022   CO2 26 06/13/2022   BUN 13 06/13/2022   CREATININE 1.21 06/13/2022   GLUCOSE 109 (H) 06/13/2022   GFRNONAA >60 06/13/2022   CALCIUM 8.6 (L) 06/13/2022   PHOS 4.5 06/13/2022   PROT 5.6 (L) 06/12/2022   ALBUMIN 3.2 (L) 06/12/2022   BILITOT 2.7 (H) 06/12/2022   ALKPHOS 51 06/12/2022   AST 21 06/12/2022   ALT 31 06/12/2022   ANIONGAP 8 06/13/2022    CBG (last 3)  No results for input(s): "GLUCAP" in the last 72 hours.    Coagulation Profile: No results for input(s): "INR", "PROTIME" in the last 168 hours.   Radiology Studies: ECHOCARDIOGRAM COMPLETE  Result Date: 06/12/2022    ECHOCARDIOGRAM REPORT   Patient Name:   Garrett Carroll Date of Exam: 06/12/2022 Medical Rec #:  [id number]      Height:       81.0 in Accession #:    [id number]     Weight:       189.9 lb Date of Birth:  July 12, 1955      BSA:          2.269 m Patient Age:    72 years       BP:           106/86 mmHg Patient Gender: M              HR:           84 bpm. Exam Location:  Inpatient Procedure: 2D Echo, 3D  Echo, Cardiac Doppler and Color Doppler Indications:    Atrial Fibrillation I48.91  History:        Patient has no prior history of Echocardiogram  examinations.                 Arrythmias:Atrial Fibrillation; Risk Factors:Non-Smoker.  Sonographer:    Wilkie Aye RVT RCS Referring Phys: [id number] [place]  1. Left ventricular ejection fraction, by estimation, is 20 to 25%. The left ventricle has severely decreased function. The left ventricle demonstrates global hypokinesis. The left ventricular internal cavity size was moderately dilated. Left ventricular diastolic function could not be evaluated.  2. Right ventricular systolic function is severely reduced. The right ventricular size is severely enlarged.  3. Left atrial size was severely dilated.  4. Right atrial size was severely dilated.  5. The mitral valve is normal in structure. Severe mitral valve regurgitation. No evidence of mitral stenosis.  6. Tricuspid valve regurgitation is severe.  7. The aortic valve is normal in structure. Aortic valve regurgitation is not visualized. No aortic stenosis is present.  8. Aortic dilatation noted. There is borderline dilatation of the aortic root, measuring 39 mm. There is borderline dilatation of the ascending aorta, measuring 38 mm.  9. The inferior vena cava is dilated in size with <50% respiratory variability, suggesting right atrial pressure of 15 mmHg. FINDINGS  Left Ventricle: Left ventricular ejection fraction, by estimation, is 20 to 25%. The left ventricle has severely decreased function. The left ventricle demonstrates global hypokinesis. The left ventricular internal cavity size was moderately dilated. There is no left ventricular hypertrophy. Left ventricular diastolic function could not be evaluated due to atrial fibrillation. Left ventricular diastolic function could not be evaluated. Right Ventricle: The right ventricular size is severely enlarged. No increase in right ventricular wall thickness. Right ventricular systolic function is severely reduced. Left Atrium: Left atrial size was severely dilated. Right Atrium: Right atrial size  was severely dilated. Pericardium: Trivial pericardial effusion is present. The pericardial effusion is localized near the right atrium. Mitral Valve: The mitral valve is normal in structure. Severe mitral valve regurgitation, with centrally-directed jet. No evidence of mitral valve stenosis. Tricuspid Valve: The tricuspid valve is normal in structure. Tricuspid valve regurgitation is severe. No evidence of tricuspid stenosis. Aortic Valve: The aortic valve is normal in structure. Aortic valve regurgitation is not visualized. No aortic stenosis is present. Aortic valve mean gradient measures 2.7 mmHg. Aortic valve peak gradient measures 4.0 mmHg. Aortic valve area, by VTI measures 2.39 cm. Pulmonic Valve: The pulmonic valve was normal in structure. Pulmonic valve regurgitation is trivial. No evidence of pulmonic stenosis. Aorta: Aortic dilatation noted. There is borderline dilatation of the aortic root, measuring 39 mm. There is borderline dilatation of the ascending aorta, measuring 38 mm. Venous: The inferior vena cava is dilated in size with less than 50% respiratory variability, suggesting right atrial pressure of 15 mmHg. IAS/Shunts: No atrial level shunt detected by color flow Doppler. Additional Comments: A device lead is visualized.  LEFT VENTRICLE PLAX 2D LVIDd:         6.20 cm   Diastology LVIDs:         5.20 cm   LV e' lateral:   8.11 cm/s LV PW:         1.30 cm   LV E/e' lateral: 11.8 LV IVS:        1.00 cm LVOT diam:     2.37 cm LV SV:  42 LV SV Index:   18 LVOT Area:     4.40 cm  3D Volume EF:                          3D EF:        37 %                          LV EDV:       309 ml                          LV ESV:       194 ml                          LV SV:        115 ml RIGHT VENTRICLE            IVC RV Basal diam:  5.90 cm    IVC diam: 2.70 cm RV Mid diam:    3.80 cm RV S prime:     8.73 cm/s LEFT ATRIUM            Index        RIGHT ATRIUM           Index LA diam:      4.80 cm  2.12 cm/m    RA Area:     31.50 cm LA Vol (A2C): 124.0 ml 54.64 ml/m  RA Volume:   134.00 ml 59.05 ml/m LA Vol (A4C): 127.0 ml 55.96 ml/m  AORTIC VALVE                    PULMONIC VALVE AV Area (Vmax):    2.60 cm     PV Vmax:          0.45 m/s AV Area (Vmean):   2.23 cm     PV Peak grad:     0.8 mmHg AV Area (VTI):     2.39 cm     PR End Diast Vel: 5.38 msec AV Vmax:           100.27 cm/s AV Vmean:          70.433 cm/s AV VTI:            0.175 m AV Peak Grad:      4.0 mmHg AV Mean Grad:      2.7 mmHg LVOT Vmax:         59.15 cm/s LVOT Vmean:        35.625 cm/s LVOT VTI:          0.095 m LVOT/AV VTI ratio: 0.54  AORTA Ao Root diam: 3.90 cm Ao Asc diam:  3.80 cm Ao Arch diam: 3.0 cm MITRAL VALVE               TRICUSPID VALVE MV Area (PHT): 6.04 cm    TR Peak grad:   14.0 mmHg MV Decel Time: 126 msec    TR Vmax:        187.00 cm/s MV E velocity: 95.73 cm/s MV A velocity: 30.20 cm/s  SHUNTS MV E/A ratio:  3.17        Systemic VTI:  0.09 m  Systemic Diam: 2.37 cm Glori Bickers MD Electronically signed by Glori Bickers MD Signature Date/Time: 06/12/2022/5:14:41 PM    Final    DG Chest Portable 1 View  Result Date: 06/11/2022 CLINICAL DATA:  Shortness of breath EXAM: PORTABLE CHEST 1 VIEW COMPARISON:  None Available. FINDINGS: Cardiomegaly. Streaky right basilar airspace opacity. Probable small bilateral pleural effusions. There is a ovoid 2.3 cm nodular density projecting within the right mid lung. No pneumothorax. IMPRESSION: 1. Cardiomegaly with streaky right basilar airspace opacity, which may represent atelectasis versus pneumonia. Probable small bilateral pleural effusions. 2. Ovoid 2.3 cm nodular density projecting within the right mid lung. Consider follow-up with noncontrast CT of the chest or short interval repeat chest radiograph. Electronically Signed   By: Davina Poke D.O.   On: 06/11/2022 15:46       Hosie Poisson M.D. [facility] 06/13/2022, 3:00 PM  Available  via Epic secure chat 7am-7pm After 7 pm, please refer to night coverage provider listed on amion.

## 2022-06-13 NOTE — Progress Notes (Signed)
Patient did not want to have cardiac catheterization today.  Patient has diuresed well and is tolerating all his medications well.  He truly wishes to go home.  As he has done well, no active heart failure or symptoms of active acute decompensated heart failure has resolved, he can go home but advised not to return to work until seen by [place].  I will set up a TOC visit within 1 week to see [place] back.  Discussed with Dr. Karleen Hampshire.  Home on 20 mg of Lasix along with new medications that he has been prescribed for heart failure.

## 2022-06-13 NOTE — Interval H&P Note (Signed)
History and Physical Interval Note:  06/13/2022 11:29 AM  Garrett Carroll  has presented today for surgery, with the diagnosis of heart failure.  The various methods of treatment have been discussed with the patient and family. After consideration of risks, benefits and other options for treatment, the patient has consented to  Procedure(s): RIGHT/LEFT HEART CATH AND CORONARY ANGIOGRAPHY (N/A) as a surgical intervention.  The patient's history has been reviewed, patient examined, no change in status, stable for surgery.  I have reviewed the patient's chart and labs.  Questions were answered to the patient's satisfaction.    2012 Appropriate Use Criteria for Diagnostic Catheterization Valvular Disease (Right and Left Heart Catheterization or Right Heart Catheterization Alone With or Without Left Ventriculography and Coronary Angiography) Indication:  Preoperative assessment before valvular surgery A (7) Indication: 70; Score 7   Aiyonna Lucado J Madelaine Whipple

## 2022-06-13 NOTE — Progress Notes (Signed)
Heart Failure Navigator Progress Note  Assessed for Heart & Vascular TOC clinic readiness.  Patient does not meet criteria due to [facility] patient.   Navigator will sign off at this time.    Girard Koontz, BSN, RN Heart Failure Nurse Navigator Secure Chat Only   

## 2022-06-14 ENCOUNTER — Telehealth: Payer: Self-pay

## 2022-06-14 SURGERY — ECHOCARDIOGRAM, TRANSESOPHAGEAL
Anesthesia: Monitor Anesthesia Care

## 2022-06-14 NOTE — Telephone Encounter (Signed)
Called for TOC, patient did not and VM is not set up.

## 2022-06-15 ENCOUNTER — Telehealth: Payer: Self-pay

## 2022-06-15 NOTE — Telephone Encounter (Signed)
TOC Done

## 2022-06-15 NOTE — Telephone Encounter (Signed)
Location of hospitalization: [place] Reason for hospitalization: Woke up Sunday morning with SOB Date of discharge: 06/13/2022 Date of first communication with patient: today Person contacting patient: Me Current symptoms: none Do you understand why you were in the Hospital: Yes Questions regarding discharge instructions: None Where were you discharged to: Home Medications reviewed: Yes Allergies reviewed: Yes Dietary changes reviewed: Yes. Discussed low fat and low salt diet.  Referals reviewed: NA Activities of Daily Living: Able to with mild limitations Any transportation issues/concerns: None Any patient concerns: None Confirmed importance & date/time of Follow up appt: Yes Confirmed with patient if condition begins to worsen call. Pt was given the office number and encouraged to call back with questions or concerns: Yes

## 2022-06-19 NOTE — Discharge Summary (Addendum)
Physician Discharge Summary   Patient: Garrett Carroll MRN: [id number] DOB: March 03, 1956  Admit date:     06/11/2022  Discharge date: 06/13/2022  Discharge Physician: Hosie Poisson   PCP: Iona Beard, MD   Recommendations at discharge:  Please follow up with PCP in one week.  Please follow up with cardiology as recommended.    Discharge Diagnoses: Principal Problem:   New onset atrial fibrillation (HCC) Active Problems:   Shortness of breath   Paroxysmal nocturnal dyspnea   Localized swelling of lower extremity   Elevated brain natriuretic peptide (BNP) level   Abnormal chest x-ray   Atrial fibrillation with rapid ventricular response (HCC)   Atrial fibrillation (HCC)   Protein-calorie malnutrition, severe   Hospital Course:  Garrett Carroll is a 67 y.o. male with no significant past medical history who presented to [facility] ED with complaints of shortness of breath over 2 days duration.  Associated with nasal congestion.  No chest pain, or cough.  Has swelling in his legs.  No use of tobacco.  Rarely drinks alcohol, maybe on holidays.  He presented to the ED for further evaluation.he was found to be in afib with RVR and started on cardizem gtt.  Cardiology consulted and echocardiogram ordered. Assessment and Plan:  New onset atrial fib with RVR.  On IV amiodarone gttt.  Tsh wnl Cardiology consulted.  Transitioned to oral amiodarone and on discharge.   Patient refused cardiac cath and he was transitioned to NSR .        New onset systolic heart failure:  Started on IV lasix. Strict intake and output, daily weights.  Echocardiogram showed. Left ventricular ejection fraction, by estimation, is 20 to 25%. The  left ventricle has severely decreased function. The left ventricle  demonstrates global hypokinesis. The left ventricular internal cavity size  was moderately dilated. Left  ventricular diastolic function could not be evaluated.   Right ventricular systolic function is severely  reduced. The right  ventricular size is severely enlarged.  He was transitioned to oral lasix.    Incidental findings: Ovoid 2.3 cm nodular density projecting within the right middle lung.  Consider follow-up with noncontrast CT of the chest or short interval repeat chest radiograph     Estimated body mass index is 20.35 kg/m as calculated from the following:   Height as of this encounter: 6\' 9"  (2.057 m).   Weight as of this encounter: 86.1 kg.      Consultants: cardiology.  Procedures performed: echo.   Disposition: Home Diet recommendation:  Discharge Diet Orders (From admission, onward)     Start     Ordered   06/13/22 0000  Diet - low sodium heart healthy        06/13/22 1716           Cardiac diet DISCHARGE MEDICATION: Allergies as of 06/13/2022   No Known Allergies      Medication List     STOP taking these medications    DAYQUIL PO       TAKE these medications    acetaminophen 325 MG tablet Commonly known as: TYLENOL Take 650 mg by mouth every 6 (six) hours as needed for moderate pain.   amiodarone 200 MG tablet Commonly known as: PACERONE Take 1 tablet (200 mg total) by mouth 2 (two) times daily.   empagliflozin 10 MG Tabs tablet Commonly known as: JARDIANCE Take 1 tablet (10 mg total) by mouth daily.   feeding supplement Liqd Take 237 mLs by mouth 3 (three)  times daily between meals.   furosemide 20 MG tablet Commonly known as: Lasix Take 1 tablet (20 mg total) by mouth daily.   metoprolol succinate 50 MG 24 hr tablet Commonly known as: TOPROL-XL Take 1 tablet (50 mg total) by mouth daily. Take with or immediately following a meal.   multivitamin with minerals Tabs tablet Take 1 tablet by mouth daily.   OVER THE COUNTER MEDICATION Take 1 tablet by mouth daily. Sinu-Tab   sacubitril-valsartan 24-26 MG Commonly known as: ENTRESTO Take 1 tablet by mouth 2 (two) times daily.        Follow-up Information     Tolia, Sunit, DO.  Call.   Specialties: Cardiology, Vascular Surgery Why: Our office will call you to be seen in 1 week. Please bring all medications to your appointment Contact information: [place] [address] [phone]         Iona Beard, MD. Schedule an appointment as soon as possible for a visit in 1 week(s).   Specialty: Family Medicine Contact information: [address] [place]  [address] [phone]                Discharge Exam: Filed Weights   06/11/22 2053 06/12/22 0439 06/13/22 [id number]  Weight: 86.6 kg 86.1 kg 82.3 kg   General exam: Appears calm and comfortable  Respiratory system: Clear to auscultation. Respiratory effort normal. Cardiovascular system: S1 & S2 heard, RRR. No JVD, murmurs,  Gastrointestinal system: Abdomen is nondistended, soft and nontender. Central nervous system: Alert and oriented. No focal neurological deficits. Extremities: Symmetric 5 x 5 power. Skin: No rashes,  Psychiatry: Mood & affect appropriate.    Condition at discharge: fair  The results of significant diagnostics from this hospitalization (including imaging, microbiology, ancillary and laboratory) are listed below for reference.   Imaging Studies: ECHOCARDIOGRAM COMPLETE  Result Date: 06/12/2022    ECHOCARDIOGRAM REPORT   Patient Name:   Garrett Carroll Date of Exam: 06/12/2022 Medical Rec #:  [id number]      Height:       81.0 in Accession #:    [id number]     Weight:       189.9 lb Date of Birth:  08-20-1955      BSA:          2.269 m Patient Age:    37 years       BP:           106/86 mmHg Patient Gender: M              HR:           84 bpm. Exam Location:  Inpatient Procedure: 2D Echo, 3D Echo, Cardiac Doppler and Color Doppler Indications:    Atrial Fibrillation I48.91  History:        Patient has no prior history of Echocardiogram examinations.                 Arrythmias:Atrial Fibrillation; Risk Factors:Non-Smoker.  Sonographer:    Wilkie Aye RVT  RCS Referring Phys: [id number] [place]  1. Left ventricular ejection fraction, by estimation, is 20 to 25%. The left ventricle has severely decreased function. The left ventricle demonstrates global hypokinesis. The left ventricular internal cavity size was moderately dilated. Left ventricular diastolic function could not be evaluated.  2. Right ventricular systolic function is severely reduced. The right ventricular size is severely enlarged.  3. Left atrial size was severely dilated.  4.  Right atrial size was severely dilated.  5. The mitral valve is normal in structure. Severe mitral valve regurgitation. No evidence of mitral stenosis.  6. Tricuspid valve regurgitation is severe.  7. The aortic valve is normal in structure. Aortic valve regurgitation is not visualized. No aortic stenosis is present.  8. Aortic dilatation noted. There is borderline dilatation of the aortic root, measuring 39 mm. There is borderline dilatation of the ascending aorta, measuring 38 mm.  9. The inferior vena cava is dilated in size with <50% respiratory variability, suggesting right atrial pressure of 15 mmHg. FINDINGS  Left Ventricle: Left ventricular ejection fraction, by estimation, is 20 to 25%. The left ventricle has severely decreased function. The left ventricle demonstrates global hypokinesis. The left ventricular internal cavity size was moderately dilated. There is no left ventricular hypertrophy. Left ventricular diastolic function could not be evaluated due to atrial fibrillation. Left ventricular diastolic function could not be evaluated. Right Ventricle: The right ventricular size is severely enlarged. No increase in right ventricular wall thickness. Right ventricular systolic function is severely reduced. Left Atrium: Left atrial size was severely dilated. Right Atrium: Right atrial size was severely dilated. Pericardium: Trivial pericardial effusion is present. The pericardial effusion is localized near  the right atrium. Mitral Valve: The mitral valve is normal in structure. Severe mitral valve regurgitation, with centrally-directed jet. No evidence of mitral valve stenosis. Tricuspid Valve: The tricuspid valve is normal in structure. Tricuspid valve regurgitation is severe. No evidence of tricuspid stenosis. Aortic Valve: The aortic valve is normal in structure. Aortic valve regurgitation is not visualized. No aortic stenosis is present. Aortic valve mean gradient measures 2.7 mmHg. Aortic valve peak gradient measures 4.0 mmHg. Aortic valve area, by VTI measures 2.39 cm. Pulmonic Valve: The pulmonic valve was normal in structure. Pulmonic valve regurgitation is trivial. No evidence of pulmonic stenosis. Aorta: Aortic dilatation noted. There is borderline dilatation of the aortic root, measuring 39 mm. There is borderline dilatation of the ascending aorta, measuring 38 mm. Venous: The inferior vena cava is dilated in size with less than 50% respiratory variability, suggesting right atrial pressure of 15 mmHg. IAS/Shunts: No atrial level shunt detected by color flow Doppler. Additional Comments: A device lead is visualized.  LEFT VENTRICLE PLAX 2D LVIDd:         6.20 cm   Diastology LVIDs:         5.20 cm   LV e' lateral:   8.11 cm/s LV PW:         1.30 cm   LV E/e' lateral: 11.8 LV IVS:        1.00 cm LVOT diam:     2.37 cm LV SV:         42 LV SV Index:   18 LVOT Area:     4.40 cm  3D Volume EF:                          3D EF:        37 %                          LV EDV:       309 ml                          LV ESV:       194 ml  LV SV:        115 ml RIGHT VENTRICLE            IVC RV Basal diam:  5.90 cm    IVC diam: 2.70 cm RV Mid diam:    3.80 cm RV S prime:     8.73 cm/s LEFT ATRIUM            Index        RIGHT ATRIUM           Index LA diam:      4.80 cm  2.12 cm/m   RA Area:     31.50 cm LA Vol (A2C): 124.0 ml 54.64 ml/m  RA Volume:   134.00 ml 59.05 ml/m LA Vol (A4C): 127.0 ml  55.96 ml/m  AORTIC VALVE                    PULMONIC VALVE AV Area (Vmax):    2.60 cm     PV Vmax:          0.45 m/s AV Area (Vmean):   2.23 cm     PV Peak grad:     0.8 mmHg AV Area (VTI):     2.39 cm     PR End Diast Vel: 5.38 msec AV Vmax:           100.27 cm/s AV Vmean:          70.433 cm/s AV VTI:            0.175 m AV Peak Grad:      4.0 mmHg AV Mean Grad:      2.7 mmHg LVOT Vmax:         59.15 cm/s LVOT Vmean:        35.625 cm/s LVOT VTI:          0.095 m LVOT/AV VTI ratio: 0.54  AORTA Ao Root diam: 3.90 cm Ao Asc diam:  3.80 cm Ao Arch diam: 3.0 cm MITRAL VALVE               TRICUSPID VALVE MV Area (PHT): 6.04 cm    TR Peak grad:   14.0 mmHg MV Decel Time: 126 msec    TR Vmax:        187.00 cm/s MV E velocity: 95.73 cm/s MV A velocity: 30.20 cm/s  SHUNTS MV E/A ratio:  3.17        Systemic VTI:  0.09 m                            Systemic Diam: 2.37 cm Glori Bickers MD Electronically signed by Glori Bickers MD Signature Date/Time: 06/12/2022/5:14:41 PM    Final    DG Chest Portable 1 View  Result Date: 06/11/2022 CLINICAL DATA:  Shortness of breath EXAM: PORTABLE CHEST 1 VIEW COMPARISON:  None Available. FINDINGS: Cardiomegaly. Streaky right basilar airspace opacity. Probable small bilateral pleural effusions. There is a ovoid 2.3 cm nodular density projecting within the right mid lung. No pneumothorax. IMPRESSION: 1. Cardiomegaly with streaky right basilar airspace opacity, which may represent atelectasis versus pneumonia. Probable small bilateral pleural effusions. 2. Ovoid 2.3 cm nodular density projecting within the right mid lung. Consider follow-up with noncontrast CT of the chest or short interval repeat chest radiograph. Electronically Signed   By: Davina Poke D.O.   On: 06/11/2022 15:46    Microbiology: Results for orders placed or performed during the hospital  encounter of 06/11/22  Resp panel by RT-PCR (RSV, Flu A&B, Covid) Anterior Nasal Swab     Status: None   Collection  Time: 06/11/22  3:11 PM   Specimen: Anterior Nasal Swab  Result Value Ref Range Status   SARS Coronavirus 2 by RT PCR NEGATIVE NEGATIVE Final   Influenza A by PCR NEGATIVE NEGATIVE Final   Influenza B by PCR NEGATIVE NEGATIVE Final    Comment: (NOTE) The Xpert Xpress SARS-CoV-2/FLU/RSV plus assay is intended as an aid in the diagnosis of influenza from Nasopharyngeal swab specimens and should not be used as a sole basis for treatment. Nasal washings and aspirates are unacceptable for Xpert Xpress SARS-CoV-2/FLU/RSV testing.  Fact Sheet for Patients: EntrepreneurPulse.com.au  Fact Sheet for Healthcare Providers: IncredibleEmployment.be  This test is not yet approved or cleared by the [place] FDA and has been authorized for detection and/or diagnosis of SARS-CoV-2 by FDA under an Emergency Use Authorization (EUA). This EUA will remain in effect (meaning this test can be used) for the duration of the COVID-19 declaration under Section 564(b)(1) of the Act, 21 U.S.C. section 360bbb-3(b)(1), unless the authorization is terminated or revoked.     Resp Syncytial Virus by PCR NEGATIVE NEGATIVE Final    Comment: (NOTE) Fact Sheet for Patients: EntrepreneurPulse.com.au  Fact Sheet for Healthcare Providers: IncredibleEmployment.be  This test is not yet approved or cleared by the [place] FDA and has been authorized for detection and/or diagnosis of SARS-CoV-2 by FDA under an Emergency Use Authorization (EUA). This EUA will remain in effect (meaning this test can be used) for the duration of the COVID-19 declaration under Section 564(b)(1) of the Act, 21 U.S.C. section 360bbb-3(b)(1), unless the authorization is terminated or revoked.  Performed at [facility], [place] [address], [place], [place] [address]     Labs: CBC: Recent Labs  Lab 06/13/22 0144  WBC 4.1  HGB 12.3*  HCT 37.4*  MCV  91.0  PLT [id number]*   Basic Metabolic Panel: Recent Labs  Lab 06/13/22 0144  NA 140  K 3.9  CL 106  CO2 26  GLUCOSE 109*  BUN 13  CREATININE 1.21  CALCIUM 8.6*  MG 2.1  PHOS 4.5   Liver Function Tests: No results for input(s): "AST", "ALT", "ALKPHOS", "BILITOT", "PROT", "ALBUMIN" in the last 168 hours. CBG: No results for input(s): "GLUCAP" in the last 168 hours.  Discharge time spent: 39 minutes.   Signed: Hosie Poisson, MD [facility]   Addendum:  Discussed with Dr Einar Gip, He said he will call in the Oral Eliquis to the pharmacy.    Hosie Poisson, MD

## 2022-06-23 ENCOUNTER — Encounter: Payer: Self-pay | Admitting: Cardiology

## 2022-06-23 ENCOUNTER — Ambulatory Visit: Payer: BC Managed Care – PPO | Admitting: Cardiology

## 2022-06-23 VITALS — BP 81/63 | HR 49 | Resp 16 | Ht >= 80 in | Wt 185.4 lb

## 2022-06-23 DIAGNOSIS — I429 Cardiomyopathy, unspecified: Secondary | ICD-10-CM

## 2022-06-23 DIAGNOSIS — I5022 Chronic systolic (congestive) heart failure: Secondary | ICD-10-CM

## 2022-06-23 DIAGNOSIS — Z7901 Long term (current) use of anticoagulants: Secondary | ICD-10-CM

## 2022-06-23 DIAGNOSIS — Z79899 Other long term (current) drug therapy: Secondary | ICD-10-CM

## 2022-06-23 DIAGNOSIS — I4819 Other persistent atrial fibrillation: Secondary | ICD-10-CM

## 2022-06-23 MED ORDER — FUROSEMIDE 20 MG PO TABS: 20.0000 mg | ORAL_TABLET | Freq: Every day | ORAL | 11 refills | Status: DC | PRN

## 2022-06-23 MED ORDER — APIXABAN 5 MG PO TABS: 5.0000 mg | ORAL_TABLET | Freq: Two times a day (BID) | ORAL | 0 refills | Status: DC

## 2022-06-23 MED ORDER — SACUBITRIL-VALSARTAN 24-26 MG PO TABS: 1.0000 | ORAL_TABLET | Freq: Two times a day (BID) | ORAL | 2 refills | Status: DC

## 2022-06-23 MED ORDER — AMIODARONE HCL 200 MG PO TABS: 200.0000 mg | ORAL_TABLET | Freq: Every day | ORAL | 0 refills | Status: DC

## 2022-06-23 NOTE — Progress Notes (Signed)
ID:  Garrett Carroll, DOB Aug 02, 1955, MRN [id number]  PCP:  Iona Beard, MD  Cardiologist:  Rex Kras, DO, [facility] (established care 06/11/2022)  Date: 06/23/22 Last Visit: 06/13/2022  Chief Complaint  Patient presents with   Transitions Of Care   Hospitalization Follow-up    HPI  Garrett Carroll is a 67 y.o. African-American male whose past medical history and cardiovascular risk factors include: Newly discovered cardiomyopathy, chronic HFrEF, atrial fibrillation, advanced age.  Patient presents today accompanied by his wife Doris at today's visit.  He was seen in consult on 06/11/2022 during his recent hospitalization when he presented with shortness of breath and was found to be in A-fib with RVR.  He was started on parenteral medications and his rate improved but did not convert to sinus rhythm.  Follow-up echo noted severely reduced LVEF and the shared decision was to proceed with left and right heart catheterization once he is diuresed to evaluate for obstructive CAD and her hemodynamics.  Thereafter to consider TEE guided cardioversion to restore normal sinus rhythm.  However, he refused to undergo cardioversion as well as heart catheterization and was sent home on medical therapy for newly discovered HFrEF/A-fib.  He presents today for transition of care/posthospitalization follow-up.  He denies anginal chest pain or heart failure symptoms.  His weight is remaining stable 185 pounds.  His blood pressures are soft but denies lightheaded or dizziness.  He just has generalized tired and fatigue.  For reasons unknown he has not been on anticoagulation since discharge.  ALLERGIES: No Known Allergies  MEDICATION LIST PRIOR TO VISIT: Current Meds  Medication Sig   acetaminophen (TYLENOL) 325 MG tablet Take 650 mg by mouth every 6 (six) hours as needed for moderate pain.   apixaban (ELIQUIS) 5 MG TABS tablet Take 1 tablet (5 mg total) by mouth 2 (two) times daily.   empagliflozin  (JARDIANCE) 10 MG TABS tablet Take 1 tablet (10 mg total) by mouth daily.   feeding supplement (ENSURE ENLIVE / ENSURE PLUS) LIQD Take 237 mLs by mouth 3 (three) times daily between meals.   metoprolol succinate (TOPROL-XL) 50 MG 24 hr tablet Take 1 tablet (50 mg total) by mouth daily. Take with or immediately following a meal.   [DISCONTINUED] amiodarone (PACERONE) 200 MG tablet Take 1 tablet (200 mg total) by mouth 2 (two) times daily.   [DISCONTINUED] furosemide (LASIX) 20 MG tablet Take 1 tablet (20 mg total) by mouth daily.   [DISCONTINUED] sacubitril-valsartan (ENTRESTO) 24-26 MG Take 1 tablet by mouth 2 (two) times daily.     PAST MEDICAL HISTORY: Past Medical History:  Diagnosis Date   Atrial fibrillation ([place])    Cardiomyopathy ([place])    CHF (congestive heart failure) ([place])     PAST SURGICAL HISTORY: History reviewed. No pertinent surgical history.  FAMILY HISTORY: The patient family history includes Breast cancer in his mother; Stroke in his brother.  SOCIAL HISTORY:  The patient  reports that he has never smoked. He does not have any smokeless tobacco history on file. He reports current alcohol use. He reports that he does not currently use drugs after having used the following drugs: Marijuana and Cocaine.  REVIEW OF SYSTEMS: Review of Systems  Constitutional: Positive for malaise/fatigue.  HENT:         Sinus   Cardiovascular:  Negative for chest pain, claudication, dyspnea on exertion, irregular heartbeat, leg swelling, near-syncope, orthopnea, palpitations, paroxysmal nocturnal dyspnea and syncope.  Respiratory:  Negative for shortness of breath.  Hematologic/Lymphatic: Negative for bleeding problem.  Musculoskeletal:  Negative for muscle cramps and myalgias.  Neurological:  Negative for dizziness and light-headedness.    PHYSICAL EXAM:    06/23/2022    1:56 PM 06/13/2022    6:32 AM 06/12/2022    7:54 PM  Vitals with BMI  Height 6\' 9"     Weight 185 lbs 6 oz  181 lbs 8 oz   BMI [id number]   Systolic 81  99991111  Diastolic 63  123456  Pulse 49  74    Physical Exam  Constitutional: No distress.  Age appropriate, hemodynamically stable.   Neck: No JVD present.  Cardiovascular: Normal rate, S1 normal, S2 normal, intact distal pulses and normal pulses. An irregularly irregular rhythm present. Exam reveals no gallop, no S3 and no S4.  Murmur heard. Holosystolic murmur is present with a grade of 3/6. Pulmonary/Chest: Effort normal and breath sounds normal. No stridor. He has no wheezes. He has no rales.  Abdominal: Soft. Bowel sounds are normal. He exhibits no distension. There is no abdominal tenderness.  Musculoskeletal:        General: No edema.     Cervical back: Neck supple.  Neurological: He is alert and oriented to person, place, and time. He has intact cranial nerves (2-12).  Skin: Skin is warm and moist.     CARDIAC DATABASE: EKG: 05/14/2022 atrial fibrillation with controlled ventricular response at rate of 83 bpm, normal axis, LVH.  Nonspecific T abnormality.  Compared to 06/11/2022, A-fib with RVR now replaced by controlled ventricular rate.  Nonspecific T abnormality   06/23/2022: Atrial fibrillation, 71 bpm.  Echocardiogram: 06/12/2022:   1. Left ventricular ejection fraction, by estimation, is 20 to 25%. The left ventricle has severely decreased function. The left ventricle demonstrates global hypokinesis. The left ventricular internal cavity size was moderately dilated. Left  ventricular diastolic function could not be evaluated.  2. Right ventricular systolic function is severely reduced. The right ventricular size is severely enlarged.  3. Left atrial size was severely dilated.  4. Right atrial size was severely dilated.  5. The mitral valve is normal in structure. Severe mitral valve regurgitation. No evidence of mitral stenosis.  6. Tricuspid valve regurgitation is severe.  7. The aortic valve is normal in structure. Aortic  valve regurgitation is not visualized. No aortic stenosis is present.  8. Aortic dilatation noted. There is borderline dilatation of the aortic root, measuring 39 mm. There is borderline dilatation of the ascending aorta, measuring 38 mm.  9. The inferior vena cava is dilated in size with <50% respiratory variability, suggesting right atrial pressure of 15 mmHg.    Stress Testing: No results found for this or any previous visit from the past 1095 days.   Heart Catheterization: None  LABORATORY DATA:    Latest Ref Rng & Units 06/13/2022    1:44 AM 06/12/2022    5:56 AM 06/11/2022    3:22 PM  CBC  WBC 4.0 - 10.5 K/uL 4.1  3.8  4.8   Hemoglobin 13.0 - 17.0 g/dL 12.3  12.3  14.1   Hematocrit 39.0 - 52.0 % 37.4  36.9  42.6   Platelets 150 - 400 K/uL 143  131  158        Latest Ref Rng & Units 06/13/2022    1:44 AM 06/12/2022    5:56 AM 06/11/2022    3:22 PM  CMP  Glucose 70 - 99 mg/dL 109  91  106   BUN 8 -  23 mg/dL 13  10  12    Creatinine 0.61 - 1.24 mg/dL 1.21  1.04  1.13   Sodium 135 - 145 mmol/L 140  139  140   Potassium 3.5 - 5.1 mmol/L 3.9  3.8  3.9   Chloride 98 - 111 mmol/L 106  108  109   CO2 22 - 32 mmol/L 26  21  21    Calcium 8.9 - 10.3 mg/dL 8.6  8.7  9.2   Total Protein 6.5 - 8.1 g/dL  5.6  6.5   Total Bilirubin 0.3 - 1.2 mg/dL  2.7  2.4   Alkaline Phos 38 - 126 U/L  51  60   AST 15 - 41 U/L  21  30   ALT 0 - 44 U/L  31  37     Lipid Panel     Component Value Date/Time   CHOL 121 06/12/2022 0556   TRIG 32 06/12/2022 0556   HDL 54 06/12/2022 0556   CHOLHDL 2.2 06/12/2022 0556   VLDL 6 06/12/2022 0556   LDLCALC 61 06/12/2022 0556   LDLDIRECT 62 06/12/2022 0556    No components found for: "NTPROBNP" No results for input(s): "PROBNP" in the last 8760 hours. Recent Labs    06/11/22 1524  TSH 2.376    BMP Recent Labs    06/11/22 1522 06/12/22 0556 06/13/22 0144  NA 140 139 140  K 3.9 3.8 3.9  CL 109 108 106  CO2 21* 21* 26  GLUCOSE 106* 91 109*   BUN 12 10 13   CREATININE 1.13 1.04 1.21  CALCIUM 9.2 8.7* 8.6*  GFRNONAA >60 >60 >60    HEMOGLOBIN A1C Lab Results  Component Value Date   HGBA1C 5.8 (H) 06/12/2022   MPG 120 06/12/2022    IMPRESSION:    ICD-10-CM   1. Chronic HFrEF (heart failure with reduced ejection fraction) (HCC)  I50.22 EKG 12-Lead    Hemoglobin and hematocrit, blood    Pro b natriuretic peptide (BNP)    Basic metabolic panel    furosemide (LASIX) 20 MG tablet    sacubitril-valsartan (ENTRESTO) 24-26 MG    2. Cardiomyopathy, unspecified type ([place])  I42.9 Hemoglobin and hematocrit, blood    Pro b natriuretic peptide (BNP)    Basic metabolic panel    3. Persistent atrial fibrillation (HCC)  I48.19 EKG 12-Lead    amiodarone (PACERONE) 200 MG tablet    4. Long term (current) use of anticoagulants  Z79.01     5. Long term current use of antiarrhythmic drug  Z79.899        RECOMMENDATIONS: Jentzen Najarian is a 67 y.o. African-American male whose past medical history and cardiac risk factors include: Newly discovered cardiomyopathy, chronic HFrEF, atrial fibrillation, advanced age.  Chronic HFrEF (heart failure with reduced ejection fraction) (HCC) Cardiomyopathy, unspecified type ([place]) Newly discovered cardiomyopathy and HFrEF, stage C, NYHA class II Discharge weight 82 kg. Today's weight 84.1 kg Office blood pressures are soft. He does not check his home blood pressures or weights regular basis. Recommend Entresto 24/26 mg p.o. twice daily to be given only if SBP greater than 100 mmHg Recommend using Lasix on as needed basis for weight greater than 185 pounds. Continue Jardiance. Recommend left and right heart catheterization to rule out obstructive CAD and evaluate hemodynamics respectively. I will like to repeat labs on Monday, 06/26/2022 and as long as the creatinine is relatively stable patient can proceed with angiography. Will also enroll him into remote patient monitoring her blood  pressure  and weights.  The procedure of left and right heart catheterization with possible intervention was explained to the patient and his wife  in detail.  The indication, alternatives, risks and benefits were reviewed.  Complications include but not limited to bleeding, infection, vascular injury, stroke, myocardial infarction, arrhythmia (requiring medical or cardiopulmonary resuscitation), kidney injury (requiring short-term or long-term hemodialysis), radiation-related injury in the case of prolonged fluoroscopy use, emergent cardiac surgery, temporary or permanent pacemaker, and death. The patient and his wife understands the risks of serious complication is 1-2 in [id number] with diagnostic cardiac cath and 1-2% or less with angioplasty/stenting.  The patient and his wife voices understanding and provides verbal feedback their questions and concerns are addressed to their satisfaction and patient wishes to proceed with coronary angiography with possible PCI.  Persistent atrial fibrillation ([place]) Long term (current) use of anticoagulants Long term current use of antiarrhythmic drug Rate control: Metoprolol. Rhythm control: Amiodarone. Thromboembolic prophylaxis: Eliquis Will reduce amiodarone to 200 mg p.o. daily For reasons unknown he has not filled his Eliquis prescription status post discharge. Since his ventricular rate is well-controlled we will initiate anticoagulation for now and anticipate cardioversion in 4 weeks. 3 weeks of Eliquis and samples provided to the patient and wife. Prescription has been sent to the pharmacy and if prior authorization for patient assistance is requested they are asked to call us for soon as possible.   FINAL MEDICATION LIST END OF ENCOUNTER: Meds ordered this encounter  Medications   apixaban (ELIQUIS) 5 MG TABS tablet    Sig: Take 1 tablet (5 mg total) by mouth 2 (two) times daily.    Dispense:  180 tablet    Refill:  0   amiodarone (PACERONE) 200 MG tablet     Sig: Take 1 tablet (200 mg total) by mouth daily.    Dispense:  90 tablet    Refill:  0   furosemide (LASIX) 20 MG tablet    Sig: Take 1 tablet (20 mg total) by mouth daily as needed (If weight increases by 3 pounds over 1 day or 5 pounds over 1 week).    Dispense:  30 tablet    Refill:  11   sacubitril-valsartan (ENTRESTO) 24-26 MG    Sig: Take 1 tablet by mouth 2 (two) times daily. Hold is SBP (top number) less than 177mmHG.    Dispense:  60 tablet    Refill:  2    Medications Discontinued During This Encounter  Medication Reason   Multiple Vitamin (MULTIVITAMIN WITH MINERALS) TABS tablet    OVER THE COUNTER MEDICATION    amiodarone (PACERONE) 200 MG tablet Reorder   sacubitril-valsartan (ENTRESTO) 24-26 MG Reorder   furosemide (LASIX) 20 MG tablet Reorder     Current Outpatient Medications:    acetaminophen (TYLENOL) 325 MG tablet, Take 650 mg by mouth every 6 (six) hours as needed for moderate pain., Disp: , Rfl:    apixaban (ELIQUIS) 5 MG TABS tablet, Take 1 tablet (5 mg total) by mouth 2 (two) times daily., Disp: 180 tablet, Rfl: 0   empagliflozin (JARDIANCE) 10 MG TABS tablet, Take 1 tablet (10 mg total) by mouth daily., Disp: 30 tablet, Rfl: 2   feeding supplement (ENSURE ENLIVE / ENSURE PLUS) LIQD, Take 237 mLs by mouth 3 (three) times daily between meals., Disp: 21330 mL, Rfl: 2   metoprolol succinate (TOPROL-XL) 50 MG 24 hr tablet, Take 1 tablet (50 mg total) by mouth daily. Take with or immediately following a meal.,  Disp: 30 tablet, Rfl: 3   amiodarone (PACERONE) 200 MG tablet, Take 1 tablet (200 mg total) by mouth daily., Disp: 90 tablet, Rfl: 0   furosemide (LASIX) 20 MG tablet, Take 1 tablet (20 mg total) by mouth daily as needed (If weight increases by 3 pounds over 1 day or 5 pounds over 1 week)., Disp: 30 tablet, Rfl: 11   sacubitril-valsartan (ENTRESTO) 24-26 MG, Take 1 tablet by mouth 2 (two) times daily. Hold is SBP (top number) less than 150mmHG., Disp: 60  tablet, Rfl: 2  Orders Placed This Encounter  Procedures   Hemoglobin and hematocrit, blood   Pro b natriuretic peptide (BNP)   Basic metabolic panel   EKG [id number]    There are no Patient Instructions on file for this visit.   --Continue cardiac medications as reconciled in final medication list. --Return in about 3 weeks (around 07/14/2022) for Follow up, heart failure management., A. fib. or sooner if needed. --Continue follow-up with your primary care physician regarding the management of your other chronic comorbid conditions.  Patient's questions and concerns were addressed to his satisfaction. He voices understanding of the instructions provided during this encounter.   This note was created using a voice recognition software as a result there may be grammatical errors inadvertently enclosed that do not reflect the nature of this encounter. Every attempt is made to correct such errors.  Rex Kras, [place], [facility]  Pager:  [phone] Office: [phone]

## 2022-06-27 ENCOUNTER — Ambulatory Visit: Payer: BC Managed Care – PPO | Admitting: Cardiology

## 2022-06-27 LAB — BASIC METABOLIC PANEL
BUN/Creatinine Ratio: 9 — ABNORMAL LOW (ref 10–24)
BUN: 10 mg/dL (ref 8–27)
CO2: 25 mmol/L (ref 20–29)
Calcium: 9.8 mg/dL (ref 8.6–10.2)
Chloride: 101 mmol/L (ref 96–106)
Creatinine, Ser: 1.17 mg/dL (ref 0.76–1.27)
Glucose: 88 mg/dL (ref 70–99)
Potassium: 5 mmol/L (ref 3.5–5.2)
Sodium: 140 mmol/L (ref 134–144)
eGFR: 69 mL/min/{1.73_m2} (ref >59)

## 2022-06-27 LAB — HEMOGLOBIN AND HEMATOCRIT, BLOOD
Hematocrit: 41.6 % (ref 37.5–51.0)
Hemoglobin: 14 g/dL (ref 13.0–17.7)

## 2022-06-27 LAB — PRO B NATRIURETIC PEPTIDE: NT-Pro BNP: 2812 pg/mL — ABNORMAL HIGH (ref 0–376)

## 2022-07-03 NOTE — Progress Notes (Signed)
Gave patient this information. He agreed to continue current medication therapy. Felicia will call him with cath appt and follow up appt.

## 2022-07-14 ENCOUNTER — Ambulatory Visit (HOSPITAL_COMMUNITY)
Admission: RE | Admit: 2022-07-14 | Discharge: 2022-07-14 | Disposition: A | Discharge status: Home / Self Care | Payer: BC Managed Care – PPO | Attending: Cardiology | Admitting: Cardiology

## 2022-07-14 ENCOUNTER — Other Ambulatory Visit: Payer: Self-pay

## 2022-07-14 ENCOUNTER — Encounter (HOSPITAL_COMMUNITY)
Admission: RE | Disposition: A | Discharge status: Home / Self Care | Payer: Self-pay | Source: Home / Self Care | Attending: Cardiology

## 2022-07-14 DIAGNOSIS — Z539 Procedure and treatment not carried out, unspecified reason: Secondary | ICD-10-CM | POA: Diagnosis present

## 2022-07-14 SURGERY — RIGHT/LEFT HEART CATH AND CORONARY ANGIOGRAPHY
Anesthesia: LOCAL

## 2022-07-14 MED ORDER — SODIUM CHLORIDE 0.9 % IV SOLN: 250.0000 mL | INTRAVENOUS | Status: DC | PRN

## 2022-07-14 MED ORDER — SODIUM CHLORIDE 0.9% FLUSH: 3.0000 mL | INTRAVENOUS | Status: DC | PRN

## 2022-07-14 MED ORDER — ASPIRIN 81 MG PO CHEW
81.0000 mg | CHEWABLE_TABLET | ORAL | Status: AC
  Administered 2022-07-14: 81 mg via ORAL
  Filled 2022-07-14: qty 1

## 2022-07-14 MED ORDER — SODIUM CHLORIDE 0.9 % IV SOLN: INTRAVENOUS | Status: DC

## 2022-07-14 MED ORDER — SODIUM CHLORIDE 0.9% FLUSH: 3.0000 mL | Freq: Two times a day (BID) | INTRAVENOUS | Status: DC

## 2022-07-14 NOTE — H&P (Signed)
Please see OV notes 06/23/2022.  Patient scheduled for right and left heart catheterization however patient had taken Eliquis this morning, hence procedure postponed.

## 2022-07-28 ENCOUNTER — Ambulatory Visit: Payer: BC Managed Care – PPO | Admitting: Cardiology

## 2022-08-08 ENCOUNTER — Ambulatory Visit: Payer: BC Managed Care – PPO | Admitting: Cardiology

## 2022-08-09 NOTE — Progress Notes (Signed)
Wifes VM was full. Left a VM on patients VM.

## 2022-09-12 ENCOUNTER — Ambulatory Visit: Payer: BC Managed Care – PPO | Admitting: Cardiology

## 2022-09-19 ENCOUNTER — Inpatient Hospital Stay (HOSPITAL_COMMUNITY)
Admission: EM | Admit: 2022-09-19 | Discharge: 2022-09-23 | DRG: 286 | Disposition: A | Discharge status: Home / Self Care | Payer: BC Managed Care – PPO | Attending: Internal Medicine | Admitting: Internal Medicine

## 2022-09-19 ENCOUNTER — Encounter: Payer: Self-pay | Admitting: Cardiology

## 2022-09-19 ENCOUNTER — Emergency Department (HOSPITAL_COMMUNITY): Payer: BC Managed Care – PPO

## 2022-09-19 ENCOUNTER — Encounter (HOSPITAL_COMMUNITY): Payer: Self-pay

## 2022-09-19 ENCOUNTER — Other Ambulatory Visit: Payer: Self-pay

## 2022-09-19 ENCOUNTER — Ambulatory Visit: Payer: BC Managed Care – PPO | Admitting: Cardiology

## 2022-09-19 VITALS — BP 98/77 | HR 72 | Ht >= 80 in | Wt 202.0 lb

## 2022-09-19 DIAGNOSIS — Z91199 Patient's noncompliance with other medical treatment and regimen due to unspecified reason: Secondary | ICD-10-CM

## 2022-09-19 DIAGNOSIS — R911 Solitary pulmonary nodule: Secondary | ICD-10-CM | POA: Diagnosis present

## 2022-09-19 DIAGNOSIS — R0602 Shortness of breath: Secondary | ICD-10-CM

## 2022-09-19 DIAGNOSIS — I081 Rheumatic disorders of both mitral and tricuspid valves: Secondary | ICD-10-CM | POA: Diagnosis present

## 2022-09-19 DIAGNOSIS — I509 Heart failure, unspecified: Secondary | ICD-10-CM

## 2022-09-19 DIAGNOSIS — D696 Thrombocytopenia, unspecified: Secondary | ICD-10-CM | POA: Diagnosis present

## 2022-09-19 DIAGNOSIS — I4819 Other persistent atrial fibrillation: Secondary | ICD-10-CM | POA: Diagnosis present

## 2022-09-19 DIAGNOSIS — Z9289 Personal history of other medical treatment: Secondary | ICD-10-CM

## 2022-09-19 DIAGNOSIS — R946 Abnormal results of thyroid function studies: Secondary | ICD-10-CM | POA: Diagnosis present

## 2022-09-19 DIAGNOSIS — I5082 Biventricular heart failure: Secondary | ICD-10-CM | POA: Diagnosis present

## 2022-09-19 DIAGNOSIS — R609 Edema, unspecified: Secondary | ICD-10-CM

## 2022-09-19 DIAGNOSIS — K5900 Constipation, unspecified: Secondary | ICD-10-CM | POA: Diagnosis present

## 2022-09-19 DIAGNOSIS — Z79899 Other long term (current) drug therapy: Secondary | ICD-10-CM

## 2022-09-19 DIAGNOSIS — Z7901 Long term (current) use of anticoagulants: Secondary | ICD-10-CM

## 2022-09-19 DIAGNOSIS — E039 Hypothyroidism, unspecified: Secondary | ICD-10-CM | POA: Diagnosis present

## 2022-09-19 DIAGNOSIS — Z91148 Patient's other noncompliance with medication regimen for other reason: Secondary | ICD-10-CM

## 2022-09-19 DIAGNOSIS — I5023 Acute on chronic systolic (congestive) heart failure: Secondary | ICD-10-CM | POA: Diagnosis present

## 2022-09-19 DIAGNOSIS — Z823 Family history of stroke: Secondary | ICD-10-CM | POA: Diagnosis not present

## 2022-09-19 DIAGNOSIS — I5020 Unspecified systolic (congestive) heart failure: Secondary | ICD-10-CM

## 2022-09-19 DIAGNOSIS — I5021 Acute systolic (congestive) heart failure: Secondary | ICD-10-CM

## 2022-09-19 DIAGNOSIS — I428 Other cardiomyopathies: Secondary | ICD-10-CM | POA: Diagnosis present

## 2022-09-19 DIAGNOSIS — Z7984 Long term (current) use of oral hypoglycemic drugs: Secondary | ICD-10-CM | POA: Diagnosis not present

## 2022-09-19 DIAGNOSIS — R7989 Other specified abnormal findings of blood chemistry: Secondary | ICD-10-CM

## 2022-09-19 DIAGNOSIS — I2722 Pulmonary hypertension due to left heart disease: Secondary | ICD-10-CM | POA: Diagnosis present

## 2022-09-19 DIAGNOSIS — Z681 Body mass index (BMI) 19 or less, adult: Secondary | ICD-10-CM | POA: Diagnosis not present

## 2022-09-19 DIAGNOSIS — I7121 Aneurysm of the ascending aorta, without rupture: Secondary | ICD-10-CM | POA: Diagnosis present

## 2022-09-19 DIAGNOSIS — Z803 Family history of malignant neoplasm of breast: Secondary | ICD-10-CM

## 2022-09-19 DIAGNOSIS — E43 Unspecified severe protein-calorie malnutrition: Secondary | ICD-10-CM | POA: Diagnosis present

## 2022-09-19 DIAGNOSIS — Z515 Encounter for palliative care: Secondary | ICD-10-CM | POA: Diagnosis not present

## 2022-09-19 DIAGNOSIS — I110 Hypertensive heart disease with heart failure: Principal | ICD-10-CM | POA: Diagnosis present

## 2022-09-19 DIAGNOSIS — I959 Hypotension, unspecified: Secondary | ICD-10-CM | POA: Diagnosis present

## 2022-09-19 DIAGNOSIS — R0600 Dyspnea, unspecified: Secondary | ICD-10-CM | POA: Diagnosis present

## 2022-09-19 DIAGNOSIS — I429 Cardiomyopathy, unspecified: Secondary | ICD-10-CM

## 2022-09-19 DIAGNOSIS — I4891 Unspecified atrial fibrillation: Secondary | ICD-10-CM | POA: Diagnosis present

## 2022-09-19 DIAGNOSIS — Z5181 Encounter for therapeutic drug level monitoring: Secondary | ICD-10-CM

## 2022-09-19 DIAGNOSIS — Z7189 Other specified counseling: Secondary | ICD-10-CM | POA: Diagnosis not present

## 2022-09-19 LAB — BASIC METABOLIC PANEL
Anion gap: 15 (ref 5–15)
BUN: 10 mg/dL (ref 8–23)
CO2: 19 mmol/L — ABNORMAL LOW (ref 22–32)
Calcium: 8.8 mg/dL — ABNORMAL LOW (ref 8.9–10.3)
Chloride: 107 mmol/L (ref 98–111)
Creatinine, Ser: 1.23 mg/dL (ref 0.61–1.24)
GFR, Estimated: >60 mL/min (ref >60)
Glucose, Bld: 131 mg/dL — ABNORMAL HIGH (ref 70–99)
Potassium: 3.6 mmol/L (ref 3.5–5.1)
Sodium: 141 mmol/L (ref 135–145)

## 2022-09-19 LAB — HEPATIC FUNCTION PANEL
ALT: 34 U/L (ref 0–44)
AST: 25 U/L (ref 15–41)
Albumin: 3.3 g/dL — ABNORMAL LOW (ref 3.5–5.0)
Alkaline Phosphatase: 57 U/L (ref 38–126)
Bilirubin, Direct: 0.3 mg/dL — ABNORMAL HIGH (ref 0.0–0.2)
Indirect Bilirubin: 3.2 mg/dL — ABNORMAL HIGH (ref 0.3–0.9)
Total Bilirubin: 3.5 mg/dL — ABNORMAL HIGH (ref 0.3–1.2)
Total Protein: 5.9 g/dL — ABNORMAL LOW (ref 6.5–8.1)

## 2022-09-19 LAB — CBC WITH DIFFERENTIAL/PLATELET
Abs Immature Granulocytes: 0.02 10*3/uL (ref 0.00–0.07)
Basophils Absolute: 0 10*3/uL (ref 0.0–0.1)
Basophils Relative: 1 %
Eosinophils Absolute: 0 10*3/uL (ref 0.0–0.5)
Eosinophils Relative: 0 %
HCT: 39.9 % (ref 39.0–52.0)
Hemoglobin: 13.3 g/dL (ref 13.0–17.0)
Immature Granulocytes: 0 %
Lymphocytes Relative: 19 %
Lymphs Abs: 1.2 10*3/uL (ref 0.7–4.0)
MCH: 31.4 pg (ref 26.0–34.0)
MCHC: 33.3 g/dL (ref 30.0–36.0)
MCV: 94.1 fL (ref 80.0–100.0)
Monocytes Absolute: 0.5 10*3/uL (ref 0.1–1.0)
Monocytes Relative: 8 %
Neutro Abs: 4.3 10*3/uL (ref 1.7–7.7)
Neutrophils Relative %: 72 %
Platelets: 105 10*3/uL — ABNORMAL LOW (ref 150–400)
RBC: 4.24 MIL/uL (ref 4.22–5.81)
RDW: 15.7 % — ABNORMAL HIGH (ref 11.5–15.5)
WBC: 6.1 10*3/uL (ref 4.0–10.5)
nRBC: 0 % (ref 0.0–0.2)

## 2022-09-19 LAB — RAPID URINE DRUG SCREEN, HOSP PERFORMED
Amphetamines: NOT DETECTED
Barbiturates: NOT DETECTED
Benzodiazepines: NOT DETECTED
Cocaine: NOT DETECTED
Opiates: NOT DETECTED
Tetrahydrocannabinol: NOT DETECTED

## 2022-09-19 LAB — TROPONIN I (HIGH SENSITIVITY)
Troponin I (High Sensitivity): 15 ng/L (ref <18)
Troponin I (High Sensitivity): 12 ng/L (ref <18)

## 2022-09-19 LAB — LACTIC ACID, PLASMA
Lactic Acid, Venous: 1.5 mmol/L (ref 0.5–1.9)
Lactic Acid, Venous: 1.6 mmol/L (ref 0.5–1.9)

## 2022-09-19 LAB — BRAIN NATRIURETIC PEPTIDE: B Natriuretic Peptide: 1138.8 pg/mL — ABNORMAL HIGH (ref 0.0–100.0)

## 2022-09-19 LAB — D-DIMER, QUANTITATIVE: D-Dimer, Quant: 1.46 ug/mL-FEU — ABNORMAL HIGH (ref 0.00–0.50)

## 2022-09-19 MED ORDER — HEPARIN BOLUS VIA INFUSION
2000.0000 [IU] | Freq: Once | INTRAVENOUS | Status: AC
  Administered 2022-09-19: 2000 [IU] via INTRAVENOUS
  Filled 2022-09-19: qty 2000

## 2022-09-19 MED ORDER — FUROSEMIDE 10 MG/ML IJ SOLN
20.0000 mg | Freq: Once | INTRAMUSCULAR | Status: AC
  Administered 2022-09-19: 20 mg via INTRAVENOUS
  Filled 2022-09-19: qty 2

## 2022-09-19 MED ORDER — ONDANSETRON HCL 4 MG/2ML IJ SOLN: 4.0000 mg | Freq: Four times a day (QID) | INTRAMUSCULAR | Status: DC | PRN

## 2022-09-19 MED ORDER — IOHEXOL 350 MG/ML SOLN
75.0000 mL | Freq: Once | INTRAVENOUS | Status: AC | PRN
  Administered 2022-09-19: 75 mL via INTRAVENOUS

## 2022-09-19 MED ORDER — FUROSEMIDE 20 MG PO TABS: 20.0000 mg | ORAL_TABLET | Freq: Two times a day (BID) | ORAL | Status: DC

## 2022-09-19 MED ORDER — ACETAMINOPHEN 650 MG RE SUPP: 650.0000 mg | Freq: Four times a day (QID) | RECTAL | Status: DC | PRN

## 2022-09-19 MED ORDER — AMIODARONE HCL IN DEXTROSE 360-4.14 MG/200ML-% IV SOLN
60.0000 mg/h | INTRAVENOUS | Status: AC
  Administered 2022-09-19: 60 mg/h via INTRAVENOUS
  Filled 2022-09-19 (×2): qty 200

## 2022-09-19 MED ORDER — HEPARIN (PORCINE) 25000 UT/250ML-% IV SOLN
1250.0000 [IU]/h | INTRAVENOUS | Status: DC
  Administered 2022-09-19 – 2022-09-20 (×2): 1250 [IU]/h via INTRAVENOUS
  Filled 2022-09-19 (×2): qty 250

## 2022-09-19 MED ORDER — SENNOSIDES-DOCUSATE SODIUM 8.6-50 MG PO TABS: 1.0000 | ORAL_TABLET | Freq: Every evening | ORAL | Status: DC | PRN

## 2022-09-19 MED ORDER — ACETAMINOPHEN 325 MG PO TABS: 650.0000 mg | ORAL_TABLET | Freq: Four times a day (QID) | ORAL | Status: DC | PRN

## 2022-09-19 MED ORDER — AMIODARONE HCL IN DEXTROSE 360-4.14 MG/200ML-% IV SOLN
30.0000 mg/h | INTRAVENOUS | Status: AC
  Administered 2022-09-20 (×2): 30 mg/h via INTRAVENOUS
  Filled 2022-09-19: qty 200

## 2022-09-19 MED ORDER — ONDANSETRON HCL 4 MG PO TABS: 4.0000 mg | ORAL_TABLET | Freq: Four times a day (QID) | ORAL | Status: DC | PRN

## 2022-09-19 MED ORDER — DAPAGLIFLOZIN PROPANEDIOL 10 MG PO TABS
10.0000 mg | ORAL_TABLET | Freq: Every day | ORAL | Status: DC
  Administered 2022-09-20 – 2022-09-23 (×4): 10 mg via ORAL
  Filled 2022-09-19 (×4): qty 1

## 2022-09-19 NOTE — ED Provider Notes (Signed)
[place] EMERGENCY DEPARTMENT AT [facility] Provider Note   CSN: [id number] Arrival date & time: 09/19/22  1435     History  Chief Complaint  Patient presents with   Shortness of Breath   Leg Swelling    Shishir Lutzow is a 67 y.o. male.  Patient here with concern for heart failure exacerbation, A-fib issue/noncompliance issue with cardiologist.  Has a history of cardiomyopathy and A-fib.  He has been taking his medications.  He states that he has not allowed him to do cardioversion or cardiac catheterization in the past because he is a little bit worried about it.  He went to cardiologist office today and they sent him for admission.  He has been having leg swelling, shortness of breath.  No chest pain.  The history is provided by the patient.       Home Medications Prior to Admission medications   Medication Sig Start Date End Date Taking? Authorizing Provider  acetaminophen (TYLENOL) 325 MG tablet Take 650 mg by mouth every 6 (six) hours as needed for moderate pain.    [provider]  amiodarone (PACERONE) 200 MG tablet Take 1 tablet (200 mg total) by mouth daily. Patient taking differently: Take 200 mg by mouth 2 (two) times daily. 06/23/22 09/21/22  Tolia, Sunit, DO  apixaban (ELIQUIS) 5 MG TABS tablet Take 1 tablet (5 mg total) by mouth 2 (two) times daily. Patient not taking: Reported on 09/19/2022 06/23/22 09/21/22  Odis Hollingshead, Sunit, DO  empagliflozin (JARDIANCE) 10 MG TABS tablet Take 1 tablet (10 mg total) by mouth daily. Patient not taking: Reported on 09/19/2022 06/14/22   Kathlen Mody, MD  furosemide (LASIX) 20 MG tablet Take 20 mg by mouth daily as needed for fluid or edema (weight increase by 3 pounds over one day or 5 pounds over week.).    [provider]  metoprolol succinate (TOPROL-XL) 50 MG 24 hr tablet Take 1 tablet (50 mg total) by mouth daily. Take with or immediately following a meal. 06/14/22   Kathlen Mody, MD  sacubitril-valsartan  (ENTRESTO) 24-26 MG Take 1 tablet by mouth 2 (two) times daily. Hold is SBP (top number) less than . 06/23/22   Tolia, Sunit, DO      Allergies    Patient has no known allergies.    Review of Systems   Review of Systems  Physical Exam Updated Vital Signs BP (!) 125/109 (BP Location: Right Arm)   Pulse (!) 105   Temp 98.1 F (36.7 C) (Oral)   Resp 16   Ht 6\' 9"  (2.057 m)   Wt 91.6 kg   SpO2 98%   BMI 21.65 kg/m  Physical Exam Vitals and nursing note reviewed.  Constitutional:      General: He is not in acute distress.    Appearance: He is well-developed.  HENT:     Head: Normocephalic and atraumatic.     Mouth/Throat:     Mouth: Mucous membranes are moist.  Eyes:     Extraocular Movements: Extraocular movements intact.     Conjunctiva/sclera: Conjunctivae normal.     Pupils: Pupils are equal, round, and reactive to light.  Cardiovascular:     Rate and Rhythm: Tachycardia present. Rhythm irregular.     Pulses: Normal pulses.     Heart sounds: Normal heart sounds. No murmur heard. Pulmonary:     Effort: Pulmonary effort is normal. No respiratory distress.     Breath sounds: Rales present.  Abdominal:  Palpations: Abdomen is soft.     Tenderness: There is no abdominal tenderness.  Musculoskeletal:        General: No swelling.     Cervical back: Normal range of motion and neck supple.     Right lower leg: Edema present.     Left lower leg: Edema present.  Skin:    General: Skin is warm and dry.     Capillary Refill: Capillary refill takes less than 2 seconds.  Neurological:     Mental Status: He is alert.  Psychiatric:        Mood and Affect: Mood normal.     ED Results / Procedures / Treatments   Labs (all labs ordered are listed, but only abnormal results are displayed) Labs Reviewed  BASIC METABOLIC PANEL - Abnormal; Notable for the following components:      Result Value   CO2 19 (*)    Glucose, Bld 131 (*)    Calcium 8.8 (*)    All other  components within normal limits  HEPATIC FUNCTION PANEL - Abnormal; Notable for the following components:   Total Protein 5.9 (*)    Albumin 3.3 (*)    Total Bilirubin 3.5 (*)    Bilirubin, Direct 0.3 (*)    Indirect Bilirubin 3.2 (*)    All other components within normal limits  D-DIMER, QUANTITATIVE - Abnormal; Notable for the following components:   D-Dimer, Quant 1.46 (*)    All other components within normal limits  CBC WITH DIFFERENTIAL/PLATELET - Abnormal; Notable for the following components:   RDW 15.7 (*)    Platelets 105 (*)    All other components within normal limits  CULTURE, BLOOD (ROUTINE X 2)  CULTURE, BLOOD (ROUTINE X 2)  LACTIC ACID, PLASMA  LACTIC ACID, PLASMA  BRAIN NATRIURETIC PEPTIDE  TROPONIN I (HIGH SENSITIVITY)  TROPONIN I (HIGH SENSITIVITY)    EKG EKG Interpretation  Date/Time:  Tuesday September 19 2022 14:51:49 EDT Ventricular Rate:  100 PR Interval:    QRS Duration: 102 QT Interval:  382 QTC Calculation: 492 R Axis:   82 Text Interpretation: Atrial fibrillation Minimal voltage criteria for LVH, may be normal variant ( Cornell product ) Nonspecific T wave abnormality Prolonged QT Abnormal ECG When compared with ECG of 12-Jun-2022 04:52, PREVIOUS ECG IS PRESENT Confirmed by Lockie Mola, Jaylie Neaves ([id number]) on 09/19/2022 3:57:16 PM  Radiology VAS [place] LOWER EXTREMITY VENOUS (DVT)  Result Date: 09/19/2022  Lower Venous DVT Study Patient Name:  ORVELL DERENZO  Date of Exam:   09/19/2022 Medical Rec #: [id number]       Accession #:    [id number] Date of Birth: 1956/02/07       Patient Gender: M Patient Age:   44 years Exam Location:  [facility] Procedure:      VAS [place] LOWER EXTREMITY VENOUS (DVT) Referring Phys: Kirbie Stodghill --------------------------------------------------------------------------------  Indications: Edema, and SOB.  Comparison Study: No previous study. Performing Technologist: McKayla Maag RVT, VT  Examination Guidelines: A complete evaluation  includes B-mode imaging, spectral Doppler, color Doppler, and power Doppler as needed of all accessible portions of each vessel. Bilateral testing is considered an integral part of a complete examination. Limited examinations for reoccurring indications may be performed as noted. The reflux portion of the exam is performed with the patient in reverse Trendelenburg.  +---------+---------------+---------+-----------+----------+--------------+ RIGHT    CompressibilityPhasicitySpontaneityPropertiesThrombus Aging +---------+---------------+---------+-----------+----------+--------------+ CFV      Full           Yes  Yes                                 +---------+---------------+---------+-----------+----------+--------------+ SFJ      Full                                                        +---------+---------------+---------+-----------+----------+--------------+ FV Prox  Full                                                        +---------+---------------+---------+-----------+----------+--------------+ FV Mid   Full                                                        +---------+---------------+---------+-----------+----------+--------------+ FV DistalFull                                                        +---------+---------------+---------+-----------+----------+--------------+ PFV      Full                                                        +---------+---------------+---------+-----------+----------+--------------+ POP      Full           Yes      Yes                                 +---------+---------------+---------+-----------+----------+--------------+ PTV      Full                                                        +---------+---------------+---------+-----------+----------+--------------+ PERO     Full                                                         +---------+---------------+---------+-----------+----------+--------------+   +----+---------------+---------+-----------+----------+--------------+ LEFTCompressibilityPhasicitySpontaneityPropertiesThrombus Aging +----+---------------+---------+-----------+----------+--------------+ CFV Full           Yes      Yes                                 +----+---------------+---------+-----------+----------+--------------+ SFJ Full                                                        +----+---------------+---------+-----------+----------+--------------+  Summary: RIGHT: - There is no evidence of deep vein thrombosis in the lower extremity.  - No cystic structure found in the popliteal fossa.  LEFT: - No evidence of common femoral vein obstruction.  *See table(s) above for measurements and observations.    Preliminary    DG Chest 2 View  Result Date: 09/19/2022 CLINICAL DATA:  Shortness of breath. EXAM: CHEST - 2 VIEW COMPARISON:  June 11, 2022. FINDINGS: Stable cardiomegaly. Mild bibasilar subsegmental atelectasis or edema is noted with small bilateral pleural effusions. Stable ovoid shaped nodular density seen in right midlung. Bony thorax is unremarkable. IMPRESSION: Cardiomegaly. Mild bibasilar subsegmental atelectasis or edema is noted with small pleural effusions. Grossly stable ovoid nodular density seen in right midlung; follow-up CT scan or chest radiograph in 3 months is recommended to ensure stability. Electronically Signed   By: Lupita Raider M.D.   On: 09/19/2022 16:30    Procedures .Critical Care  Performed by: Virgina Norfolk, DO Authorized by: Virgina Norfolk, DO   Critical care provider statement:    Critical care time (minutes):  40   Critical care was necessary to treat or prevent imminent or life-threatening deterioration of the following conditions: atrial fibrilliation with rvr.   Critical care was time spent personally by me on the following activities:  Blood  draw for specimens, development of treatment plan with patient or surrogate, discussions with primary provider, discussions with consultants, evaluation of patient's response to treatment, examination of patient, obtaining history from patient or surrogate, ordering and performing treatments and interventions, ordering and review of laboratory studies, ordering and review of radiographic studies, pulse oximetry, re-evaluation of patient's condition and review of old charts   I assumed direction of critical care for this patient from another provider in my specialty: no     Care discussed with: admitting provider       Medications Ordered in ED Medications  amiodarone (NEXTERONE PREMIX) 360-4.14 MG/200ML-% (1.8 mg/mL) IV infusion (has no administration in time range)  amiodarone (NEXTERONE PREMIX) 360-4.14 MG/200ML-% (1.8 mg/mL) IV infusion (has no administration in time range)  furosemide (LASIX) injection 20 mg (has no administration in time range)    ED Course/ Medical Decision Making/ A&P                             Medical Decision Making Amount and/or Complexity of Data Reviewed Labs: ordered. Radiology: ordered.  Risk Prescription drug management. Decision regarding hospitalization.   Rome Wolpert is here with atrial fibrillation, heart failure symptoms.  Sent by cardiologist for admission.  Talked with Dr. Georga Bora who states that he has been noncompliant with his atrial fibrillation medicines and his heart failure medicines.  He arrives with a low blood pressure but on recheck it is 125/100.  He is got atrial fibrillation in the 100-1 20 range.  He has leg swelling.  He is got a little bit of asymmetry with the right leg being bigger than the left.  Dr. Jovita Gamma wants evaluation for DVT in the right leg but overall suspects this is heart failure related.  Will put him on amiodarone infusion without bolus, IV heparin, IV Lasix.  CBC, CMP, D-dimer, troponin, BNP, chest x-ray and DVT  study ordered.  DVT study is negative.  Troponins unremarkable.  Chest x-ray with signs of volume overload.  BNP elevated.  Otherwise lab works unremarkable.  Have no concern for sepsis or infectious process.  Overall atrial fibrillation with RVR  in the setting of heart failure exacerbation.  Will admit to medicine for further care.  CT scan has been ordered to further evaluate for PE given elevated dimer.  Heparin has already been initiated for A-fib.  This chart was dictated using voice recognition software.  Despite best efforts to proofread,  errors can occur which can change the documentation meaning.         Final Clinical Impression(s) / ED Diagnoses Final diagnoses:  Atrial fibrillation, unspecified type (HCC)  Acute on chronic congestive heart failure, unspecified heart failure type [facility])    Rx / DC Orders ED Discharge Orders     None         Virgina Norfolk, DO 09/19/22 1745

## 2022-09-19 NOTE — ED Notes (Signed)
Patient sitting up eating dinner.  

## 2022-09-19 NOTE — Progress Notes (Signed)
Right lower extremity venous study completed.   Preliminary results relayed to DO in MD.  Please see CV Procedures for preliminary results.  Leiliana Foody, RVT  4:55 PM 09/19/22

## 2022-09-19 NOTE — Progress Notes (Signed)
ID:  Garrett Carroll, DOB Jul 23, 1955, MRN [id number]  PCP:  Mirna Mires, MD  Cardiologist:  Tessa Lerner, DO, [facility] (established care 06/11/2022)  Date: 09/19/22 Last Office Visit: 06/23/2022  Chief Complaint  Patient presents with   Chronic HFrEF (heart failure with reduced ejection fraction   Follow-up    HPI  Garrett Carroll is a 67 y.o. African-American male whose past medical history and cardiovascular risk factors include: HFrEF, severely reduced cardiomyopathy (recently discovered), persistent atrial fibrillation, advanced age.  Patient presents today accompanied by his wife Doris at today's visit.  Patient was seen in consult in March 2024 for A-fib with RVR.  Workup also noted severely reduced LVEF was recommended to undergo left and right heart catheterization for newly discovered cardiomyopathy as well as TEE guided cardioversion.  Initially patient agreed but later refused to undergo procedures.  And therefore the shared decision was to continue medical therapy and follow-up as outpatient.  He followed up post discharge and the shared decision at that time was to proceed with left and right heart catheterization in the setting of severely reduced cardiomyopathy.  However, heart catheterization had to be canceled as he forgot to stop anticoagulation prior to the procedure.  And thereafter was lost to follow-up.  After multiple attempts to reach the patient and speaking to both him and his wife he now presents to the office for follow-up and worsening symptoms.  Patient states that he has been out of Eliquis for at least 2 weeks as well as Jardiance and Lasix.  He has gained approximately 20 pounds since last office visit.  Complains of bilateral lower extremity swelling extending up to the ankles, JVP on physical examination, orthopnea/PND for symptoms.  ALLERGIES: No Known Allergies  MEDICATION LIST PRIOR TO VISIT: Current Meds  Medication Sig   furosemide (LASIX) 20 MG  tablet Take 20 mg by mouth daily as needed for fluid or edema (weight increase by 3 pounds over one day or 5 pounds over week.).   metoprolol succinate (TOPROL-XL) 50 MG 24 hr tablet Take 1 tablet (50 mg total) by mouth daily. Take with or immediately following a meal.   sacubitril-valsartan (ENTRESTO) 24-26 MG Take 1 tablet by mouth 2 (two) times daily. Hold is SBP (top number) less than .   [DISCONTINUED] acetaminophen (TYLENOL) 325 MG tablet Take 650 mg by mouth every 6 (six) hours as needed for moderate pain.   [DISCONTINUED] amiodarone (PACERONE) 200 MG tablet Take 1 tablet (200 mg total) by mouth daily. (Patient taking differently: Take 200 mg by mouth 2 (two) times daily.)   [DISCONTINUED] furosemide (LASIX) 20 MG tablet Take 1 tablet (20 mg total) by mouth daily as needed (If weight increases by 3 pounds over 1 day or 5 pounds over 1 week).     PAST MEDICAL HISTORY: Past Medical History:  Diagnosis Date   Atrial fibrillation (HCC)    Cardiomyopathy (HCC)    CHF (congestive heart failure) (HCC)     PAST SURGICAL HISTORY: Past Surgical History:  Procedure Laterality Date   RIGHT/LEFT HEART CATH AND CORONARY ANGIOGRAPHY N/A 09/21/2022   Procedure: RIGHT/LEFT HEART CATH AND CORONARY ANGIOGRAPHY;  Surgeon: Elder Negus, MD;  Location: [facility] INVAS[facility];  Service: Cardiovascular;  Laterality: N/A;    FAMILY HISTORY: The patient family history includes Breast cancer in his mother; Stroke in his brother.  SOCIAL HISTORY:  The patient  reports that he has never smoked. He does not have any smokeless tobacco history on file.  He reports current alcohol use. He reports that he does not currently use drugs after having used the following drugs: Marijuana and Cocaine.  REVIEW OF SYSTEMS: Review of Systems  Constitutional: Positive for malaise/fatigue and weight gain.  HENT:         Sinus   Cardiovascular:  Positive for dyspnea on exertion, leg swelling, orthopnea and  paroxysmal nocturnal dyspnea. Negative for chest pain, claudication, irregular heartbeat, near-syncope, palpitations and syncope.  Respiratory:  Positive for shortness of breath.   Hematologic/Lymphatic: Negative for bleeding problem.  Musculoskeletal:  Negative for muscle cramps and myalgias.  Neurological:  Negative for dizziness and light-headedness.    PHYSICAL EXAM:     09/19/2022   11:48 AM 07/14/2022    8:57 AM 07/14/2022    8:52 AM  Vitals with BMI  Height 6\' 9"    6\' 9"   Weight 202 lbs   187 lbs  BMI 21.65   20.05  Systolic 98 105 123  Diastolic 77 90 108  Pulse 72   84     Physical Exam  Constitutional: No distress.  Age appropriate, hemodynamically stable.   Neck: JVD present.  Cardiovascular: Normal rate, S1 normal and S2 normal. An irregularly irregular rhythm present. Exam reveals no gallop, no S3 and no S4.  Murmur heard. Holosystolic murmur is present with a grade of 3/6. Pulmonary/Chest: Effort normal and breath sounds normal. No stridor. He has no wheezes. He has no rales.  Abdominal: Soft. Bowel sounds are normal. He exhibits no distension. There is no abdominal tenderness.  Musculoskeletal:        General: Edema (RLE>LLE) present.     Cervical back: Neck supple.  Neurological: He is alert and oriented to person, place, and time. He has intact cranial nerves (2-12).  Skin: Skin is warm and moist.   CARDIAC DATABASE: EKG: 05/14/2022 atrial fibrillation with controlled ventricular response at rate of 83 bpm, normal axis, LVH.  Nonspecific T abnormality.  Compared to 06/11/2022, A-fib with RVR now replaced by controlled ventricular rate.  Nonspecific T abnormality   September 19, 2022: Atrial fibrillation, 113 bpm, IRBBB, occasional PVCs.   Echocardiogram: 06/12/2022:   1. Left ventricular ejection fraction, by estimation, is 20 to 25%. The left ventricle has severely decreased function. The left ventricle demonstrates global hypokinesis. The left ventricular  internal cavity size was moderately dilated. Left  ventricular diastolic function could not be evaluated.  2. Right ventricular systolic function is severely reduced. The right ventricular size is severely enlarged.  3. Left atrial size was severely dilated.  4. Right atrial size was severely dilated.  5. The mitral valve is normal in structure. Severe mitral valve regurgitation. No evidence of mitral stenosis.  6. Tricuspid valve regurgitation is severe.  7. The aortic valve is normal in structure. Aortic valve regurgitation is not visualized. No aortic stenosis is present.  8. Aortic dilatation noted. There is borderline dilatation of the aortic root, measuring 39 mm. There is borderline dilatation of the ascending aorta, measuring 38 mm.  9. The inferior vena cava is dilated in size with <50% respiratory variability, suggesting right atrial pressure of 15 mmHg.    Stress Testing: No results found for this or any previous visit from the past 1095 days.   Heart Catheterization: None  LABORATORY DATA:    Latest Ref Rng & Units 09/22/2022    2:48 AM 09/21/2022    8:11 AM 09/21/2022    8:06 AM  CBC  WBC 4.0 - 10.5 K/uL 4.2  Hemoglobin 13.0 - 17.0 g/dL 16.1  09.6  04.5   Hematocrit 39.0 - 52.0 % 37.9  37.0  37.0   Platelets 150 - 400 K/uL 108          Latest Ref Rng & Units 09/22/2022    2:48 AM 09/21/2022    8:11 AM 09/21/2022    8:06 AM  CMP  Glucose 70 - 99 mg/dL 94     BUN 8 - 23 mg/dL 12     Creatinine 4.09 - 1.24 mg/dL 8.11     Sodium 914 - 782 mmol/L 136  141  141   Potassium 3.5 - 5.1 mmol/L 3.9  3.6  3.7   Chloride 98 - 111 mmol/L 103     CO2 22 - 32 mmol/L 24     Calcium 8.9 - 10.3 mg/dL 8.3       Lipid Panel     Component Value Date/Time   CHOL 121 06/12/2022 0556   TRIG 32 06/12/2022 0556   HDL 54 06/12/2022 0556   CHOLHDL 2.2 06/12/2022 0556   VLDL 6 06/12/2022 0556   LDLCALC 61 06/12/2022 0556   LDLDIRECT 62 06/12/2022 0556    No components found  for: "NTPROBNP" Recent Labs    06/26/22 1133  PROBNP 2,812*   Recent Labs    06/11/22 1524 09/20/22 0040  TSH 2.376 16.790*    BMP Recent Labs    09/20/22 0040 09/21/22 0222 09/21/22 0801 09/21/22 0806 09/21/22 0811 09/22/22 0248  NA 138 140   < > 141 141 136  K 3.6 4.0   < > 3.7 3.6 3.9  CL 108 106  --   --   --  103  CO2 19* 24  --   --   --  24  GLUCOSE 113* 94  --   --   --  94  BUN 11 12  --   --   --  12  CREATININE 1.17 1.37*  --   --   --  1.36*  CALCIUM 8.4* 8.6*  --   --   --  8.3*  GFRNONAA >60 57*  --   --   --  58*   < > = values in this interval not displayed.    HEMOGLOBIN A1C Lab Results  Component Value Date   HGBA1C 5.8 (H) 06/12/2022   MPG 120 06/12/2022    IMPRESSION:    ICD-10-CM   1. Acute HFrEF (heart failure with reduced ejection fraction) (HCC)  I50.21 EKG 12-Lead    2. Cardiomyopathy, unspecified type (HCC)  I42.9     3. Persistent atrial fibrillation (HCC)  I48.19     4. Long term (current) use of anticoagulants  Z79.01     5. Long term current use of antiarrhythmic drug  Z79.899        RECOMMENDATIONS: Azmir Macdermott is a 67 y.o. African-American male whose past medical history and cardiac risk factors include: Newly discovered cardiomyopathy, chronic HFrEF, atrial fibrillation, advanced age.  Acute HFrEF (heart failure with reduced ejection fraction) (HCC) Cardiomyopathy, unspecified type (HCC) Recently discovered cardiomyopathy and HFrEF, stage C, NYHA class III/IV Discharge weight 180 pounds Today's weight 202 pounds As per physical examination findings and symptoms concerning for acute decompensated heart failure with very minimal reserve at baseline. Spoke with both patient and wife at length recommend hospitalization for parenteral diuretics and originally planned cardiovascular workup including but not limited to heart catheterization and TEE guided cardioversion.  Persistent atrial fibrillation (  HCC) Long term  (current) use of anticoagulants Long term current use of antiarrhythmic drug Rate control: Metoprolol. Rhythm control: Amiodarone. Thromboembolic prophylaxis: Eliquis Has not been compliant with anticoagulation -due to choice.   Patient and wife are agreeable with going to [facility] for further evaluation and management.  Currently managed by primary care provider.  Viral culture  FINAL MEDICATION LIST END OF ENCOUNTER: No orders of the defined types were placed in this encounter.   Medications Discontinued During This Encounter  Medication Reason   furosemide (LASIX) 20 MG tablet     No current facility-administered medications for this visit.  Current Outpatient Medications:    amiodarone (PACERONE) 200 MG tablet, Take 1 tablet (200 mg total) by mouth 2 (two) times daily., Disp: 60 tablet, Rfl: 0   carvedilol (COREG) 3.125 MG tablet, Take 1 tablet (3.125 mg total) by mouth 2 (two) times daily., Disp: 60 tablet, Rfl: 0   [START ON 09/23/2022] dapagliflozin propanediol (FARXIGA) 10 MG TABS tablet, Take 1 tablet (10 mg total) by mouth daily., Disp: 30 tablet, Rfl: 0   ivabradine (CORLANOR) 7.5 MG TABS tablet, Take 1 tablet (7.5 mg total) by mouth 2 (two) times daily with a meal., Disp: 60 tablet, Rfl: 0   midodrine (PROAMATINE) 5 MG tablet, Take 1 tablet (5 mg total) by mouth 2 (two) times daily with a meal., Disp: 60 tablet, Rfl: 0  Facility-Administered Medications Ordered in Other Visits:    0.9 %  sodium chloride infusion, 250 mL, Intravenous, PRN, Yates Decamp, MD, New Bag at 09/22/22 1408   acetaminophen (TYLENOL) tablet 650 mg, 650 mg, Oral, Q6H PRN **OR** acetaminophen (TYLENOL) suppository 650 mg, 650 mg, Rectal, Q6H PRN, Yates Decamp, MD   amiodarone (PACERONE) tablet 200 mg, 200 mg, Oral, BID, Yates Decamp, MD, 200 mg at 09/22/22 0857   apixaban (ELIQUIS) tablet 5 mg, 5 mg, Oral, BID, Yates Decamp, MD, 5 mg at 09/22/22 0858   dapagliflozin propanediol (FARXIGA) tablet 10 mg,  10 mg, Oral, Daily, Yates Decamp, MD, 10 mg at 09/22/22 0857   ivabradine (CORLANOR) tablet 7.5 mg, 7.5 mg, Oral, BID WC, Zannie Cove, MD, 7.5 mg at 09/22/22 1711   midodrine (PROAMATINE) tablet 5 mg, 5 mg, Oral, TID WC, Yates Decamp, MD, 5 mg at 09/22/22 1711   ondansetron (ZOFRAN) tablet 4 mg, 4 mg, Oral, Q6H PRN **OR** ondansetron (ZOFRAN) injection 4 mg, 4 mg, Intravenous, Q6H PRN, Yates Decamp, MD   Oral care mouth rinse, 15 mL, Mouth Rinse, PRN, Yates Decamp, MD   senna-docusate (Senokot-S) tablet 1 tablet, 1 tablet, Oral, QHS PRN, Yates Decamp, MD   sodium chloride flush (NS) 0.9 % injection 3 mL, 3 mL, Intravenous, Q12H, Yates Decamp, MD, 3 mL at 09/22/22 0858   sodium chloride flush (NS) 0.9 % injection 3 mL, 3 mL, Intravenous, Q12H, Yates Decamp, MD, 3 mL at 09/21/22 2305   sodium chloride flush (NS) 0.9 % injection 3 mL, 3 mL, Intravenous, PRN, Yates Decamp, MD  Orders Placed This Encounter  Procedures   EKG 12-Lead    There are no Patient Instructions on file for this visit.   --Continue cardiac medications as reconciled in final medication list. --Return in about 4 weeks (around 10/17/2022) for Follow up, A. fib,cadiomyopathy. or sooner if needed. --Continue follow-up with your primary care physician regarding the management of your other chronic comorbid conditions.  Patient's questions and concerns were addressed to his satisfaction. He voices understanding of the instructions provided during this encounter.  This note was created using a voice recognition software as a result there may be grammatical errors inadvertently enclosed that do not reflect the nature of this encounter. Every attempt is made to correct such errors.  Tessa Lerner, [place], [facility]  Pager:  [phone] Office: [phone]

## 2022-09-19 NOTE — Consult Note (Signed)
CARDIOLOGY CONSULT NOTE  Patient ID: Garrett Carroll MRN: [id number] DOB/AGE: [birth year] 67 y.o.  Admit date: 09/19/2022 Attending physician: Dr. Lockie Mola Primary Physician:  Mirna Mires, MD Outpatient Cardiology Provider: Tessa Lerner, DO, [facility] Inpatient Cardiologist: Tessa Lerner, DO, [facility]  Reason of consultation: CHF and Afib RVR Referring physician: Dr. Lockie Mola.   Chief complaint: Shortness of breath  HPI:  Garrett Carroll is a 67 y.o. African-American male who presents with a chief complaint of " shortness of breath." His past medical history and cardiovascular risk factors include: Cardiomyopathy, HFrEF, atrial fibrillation, lung nodule, noncompliance.  Establish care during his hospitalization in March 2024 when he was diagnosed with atrial fibrillation and severely reduced LVEF.  During the hospitalization he was scheduled for heart catheterization as well as TEE & DDCV then refused to go forward and therefore the procedures were canceled.  When he establish care as outpatient the initial plan was to proceed with left and right heart catheterization given newly discovered cardiomyopathy to evaluate for obstructive disease and hemodynamics respectively.  However this procedure had to be canceled and he was asked to follow-up.  Patient was lost to follow-up until earlier this morning when he presented to the office with worsening shortness of breath, lower extremity swelling, and weight gain.  Patient has been out of his diuretics for many days if not weeks.  He is also not taking his Eliquis regularly.  He has gained 20 pounds since the last office visit.  Given his severely reduced LVEF, A-fib with RVR, and need for diuresis he was requested to come to the ED for further evaluation and management.  He is accompanied by family at bedside.  He denies any active chest pain.  Complains of orthopnea, PND, lower extremity swelling.  Lower extremity venous duplex is negative for DVT and CT PE  protocol is pending.  ALLERGIES: No Known Allergies  PAST MEDICAL HISTORY: Past Medical History:  Diagnosis Date   Atrial fibrillation (HCC)    Cardiomyopathy (HCC)    CHF (congestive heart failure) (HCC)     PAST SURGICAL HISTORY: History reviewed. No pertinent surgical history.  FAMILY HISTORY: The patient's family history includes Breast cancer in his mother; Stroke in his brother.   SOCIAL HISTORY:  The patient  reports that he has never smoked. He does not have any smokeless tobacco history on file. He reports current alcohol use. He reports that he does not currently use drugs after having used the following drugs: Marijuana and Cocaine.  MEDICATIONS: Current Outpatient Medications  Medication Instructions   amiodarone (PACERONE) 200 mg, Oral, Daily   apixaban (ELIQUIS) 5 mg, Oral, 2 times daily   empagliflozin (JARDIANCE) 10 mg, Oral, Daily   furosemide (LASIX) 20 mg, Oral, Daily PRN   metoprolol succinate (TOPROL-XL) 50 mg, Oral, Daily, Take with or immediately following a meal.   sacubitril-valsartan (ENTRESTO) 24-26 MG 1 tablet, Oral, 2 times daily, Hold is SBP (top number) less than .    REVIEW OF SYSTEMS: Review of Systems  Cardiovascular:  Positive for dyspnea on exertion, orthopnea and paroxysmal nocturnal dyspnea. Negative for chest pain, claudication, irregular heartbeat, leg swelling, near-syncope, palpitations and syncope.  Respiratory:  Positive for shortness of breath.   Hematologic/Lymphatic: Negative for bleeding problem.  Musculoskeletal:  Negative for muscle cramps and myalgias.  Neurological:  Positive for dizziness and light-headedness.   PHYSICAL EXAMINATION: Temp:  [98.1 F (36.7 C)] 98.1 F (36.7 C) (06/18 1504) Pulse Rate:  [72-119] 119 (06/18 1834) Resp:  [16-24] 16 (06/18  1834) BP: (79-125)/(62-109) 122/84 (06/18 1834) SpO2:  [95 %-98 %] 98 % (06/18 1834) Weight:  [91.6 kg] 91.6 kg (06/18 1524)  Intake/Output: No intake or  output data in the 24 hours ending 09/19/22 1926   Net IO Since Admission: No IO data has been entered for this period [09/19/22 1926]  Weights:     09/19/2022    3:24 PM 09/19/2022   11:48 AM 07/14/2022    8:52 AM  Last 3 Weights  Weight (lbs) 202 lb 202 lb 187 lb  Weight (kg) 91.627 kg 91.627 kg 84.823 kg     Physical Exam  Constitutional: No distress.  appears older than stated age, no acute distress, hemodynamically stable.   Neck: JVD present.  Cardiovascular: S1 normal, S2 normal, intact distal pulses and normal pulses. An irregularly irregular rhythm present. Tachycardia present. Exam reveals no gallop, no S3 and no S4.  Murmur heard. Holosystolic murmur is present with a grade of 3/6 at the apex. Pulmonary/Chest: Effort normal and breath sounds normal. No stridor. He has no wheezes. He has no rales.   decreased breath sounds at the bases.  Abdominal: Soft. Bowel sounds are normal. He exhibits no distension. There is no abdominal tenderness.  Musculoskeletal:        General: Edema (Right greater than left lower extremity) present.     Cervical back: Neck supple.  Neurological: He is alert and oriented to person, place, and time. He has intact cranial nerves (2-12).  Skin: Skin is warm and moist.   Neck LAB RESULTS: Chemistry Recent Labs  Lab 09/19/22 1537 09/19/22 1548  NA 141  --   K 3.6  --   CL 107  --   CO2 19*  --   GLUCOSE 131*  --   BUN 10  --   CREATININE 1.23  --   CALCIUM 8.8*  --   PROT  --  5.9*  ALBUMIN  --  3.3*  AST  --  25  ALT  --  34  ALKPHOS  --  57  BILITOT  --  3.5*  GFRNONAA >60  --   ANIONGAP 15  --     Hematology Recent Labs  Lab 09/19/22 1548  WBC 6.1  RBC 4.24  HGB 13.3  HCT 39.9  MCV 94.1  MCH 31.4  MCHC 33.3  RDW 15.7*  PLT 105*   High Sensitivity Troponin:   Recent Labs  Lab 09/19/22 1548 09/19/22 1624  TROPONINIHS 15 12     Cardiac EnzymesNo results for input(s): "TROPONINI" in the last 168 hours. No results  for input(s): "TROPIPOC" in the last 168 hours.  BNP Recent Labs  Lab 09/19/22 1545  BNP 1,138.8*    DDimer  Recent Labs  Lab 09/19/22 1548  DDIMER 1.46*    Hemoglobin A1c:  Lab Results  Component Value Date   HGBA1C 5.8 (H) 06/12/2022   MPG 120 06/12/2022   TSH  Recent Labs    06/11/22 1524  TSH 2.376   Lipid Panel  Lab Results  Component Value Date   CHOL 121 06/12/2022   HDL 54 06/12/2022   LDLCALC 61 06/12/2022   LDLDIRECT 62 06/12/2022   TRIG 32 06/12/2022   CHOLHDL 2.2 06/12/2022   Drugs of Abuse     Component Value Date/Time   LABOPIA NONE DETECTED 06/12/2022 0457   COCAINSCRNUR NONE DETECTED 06/12/2022 0457   LABBENZ NONE DETECTED 06/12/2022 0457   AMPHETMU NONE DETECTED 06/12/2022 0457   THCU NONE  DETECTED 06/12/2022 0457   LABBARB NONE DETECTED 06/12/2022 0457      CARDIAC DATABASE: EKG: 06/19/2022: Atrial fibrillation, 100 bpm, LVH with voltage, PVCs, prolonged QT.   Echocardiogram: 06/12/2022:   1. Left ventricular ejection fraction, by estimation, is 20 to 25%. The left ventricle has severely decreased function. The left ventricle demonstrates global hypokinesis. The left ventricular internal cavity size was moderately dilated. Left  ventricular diastolic function could not be evaluated.  2. Right ventricular systolic function is severely reduced. The right ventricular size is severely enlarged.  3. Left atrial size was severely dilated.  4. Right atrial size was severely dilated.  5. The mitral valve is normal in structure. Severe mitral valve regurgitation. No evidence of mitral stenosis.  6. Tricuspid valve regurgitation is severe.  7. The aortic valve is normal in structure. Aortic valve regurgitation is not visualized. No aortic stenosis is present.  8. Aortic dilatation noted. There is borderline dilatation of the aortic root, measuring 39 mm. There is borderline dilatation of the ascending aorta, measuring 38 mm.  9. The inferior vena  cava is dilated in size with <50% respiratory variability, suggesting right atrial pressure of 15 mmHg.   Scheduled Meds:  dapagliflozin propanediol  10 mg Oral Daily    Continuous Infusions:  amiodarone 60 mg/hr (09/19/22 1825)   amiodarone     heparin 1,250 Units/hr (09/19/22 1827)    PRN Meds: acetaminophen **OR** acetaminophen, ondansetron **OR** ondansetron (ZOFRAN) IV, senna-docusate  IMPRESSION & RECOMMENDATIONS: Adric Revier is a 67 y.o. African-American male whose past medical history and cardiovascular risk factors include: Cardiomyopathy, HFrEF, atrial fibrillation, lung nodule, noncompliance..  Impression:  Acute biventricular heart failure, stage C, NYHA class III Symptomatic A-fib with RVR Long-term antiarrhythmic medications. Long-term anticoagulation Cardiomyopathy  Elevated D-dimer.  Ascending thoracic aortic aneurysm 4.1 cm (June 2024) Medication noncompliance  Plan:  Acute biventricular heart failure, stage C, NYHA class III 20 pounds of weight gain, JVP, lower extremity swelling, orthopnea/PND. BNP trending up. Lactic acid within normal limits, AST ALT within normal limits Likely secondary to medication noncompliance Start IV Lasix 20 mg twice daily. Start Farxiga 10 mg p.o. daily. Hold Entresto for now given the soft blood pressures. Focus on diuresis for now. Bilateral compression stockings to help mobilize fluid Uptitrate GDMT prior to discharge. Limited echo to rule out LV thrombus Patient is agreeable to continue with left and right heart catheterization prior to discharge  Symptomatic A-fib with RVR: Will start IV amiodarone drip without bolus Start IV heparin for anticoagulation Click Here to Calculate/Change CHADS2VASc Score The patient's CHADS2-VASc score is 3, indicating a 3.2% annual risk of stroke.  Therefore, anticoagulation is recommended.   CHF History: Yes HTN History: No Diabetes History: No Stroke History: No Vascular Disease  History: Yes  Once adequately diuresed will discuss TEE guided direct-current cardioversion.  Ascending thoracic aortic aneurysm 4.1 cm (June 2024): Will need yearly followup   Elevated D-Dimer: DVT study negative and CT PE study negative for PE.   Total encounter time 83 minutes. *Total Encounter Time as defined by the Centers for Medicare and Medicaid Services includes, in addition to the face-to-face time of a patient visit (documented in the note above) non-face-to-face time: obtaining and reviewing outside history, ordering and reviewing medications, tests or procedures, care coordination (communications with other health care professionals (ED physician, Attending physician, wife) and documentation in the medical record.  Patient's questions and concerns were addressed to his satisfaction. He voices understanding of the instructions provided during  this encounter.   This note was created using a voice recognition software as a result there may be grammatical errors inadvertently enclosed that do not reflect the nature of this encounter. Every attempt is made to correct such errors.  Delilah Shan [facility]  Pager:  [phone] Office: [phone] 09/19/2022, 7:26 PM

## 2022-09-19 NOTE — Hospital Course (Addendum)
67 year old male with history of cardiomyopathy newly discovered, chronic HFrEF, A-fib presented with shortness of breath and bilateral lower extremity swelling for a week. He has not been taking any of his meds for 2 wk as he ran out. Also complained of dizziness today.   He endorses PND orthopnea chest congestion wheezing, weakness. Patient otherwise denies any nausea, vomiting,fever, chills, headache, focal weakness, numbness tingling, speech difficulties. He is having constipation for 2 days.  Patient was seen in the cardiology office and sent to the ED. Recent hospitalization in March with A-fib and RVR-at that time refused cardioversion and heart cath. Sees Dr Odis Hollingshead. In ED initially BP 81/69> 79/61> 125/109, HR a fib in 105. Labs: bmp fairly stable, troponin 15, lactate 1.5 albumin 3.3. D DIMER 1.4, CBC with thrombocytopenia 105 CTA CHEST :pending Seen by cardiology and admission requestion for chf exacerbation Started on heparin amiodarone infusion w/ bolus, lasix x1.

## 2022-09-19 NOTE — H&P (Signed)
History and Physical    Garrett Carroll ZOX:[id number] DOB: 11/22/1955 DOA: 09/19/2022  PCP: Mirna Mires, MD   Patient coming from: home to Dr Odis Hollingshead office to ED Chief Complaint  Patient presents with   Shortness of Breath   Leg Swelling      HPI:67 year old male with history of cardiomyopathy newly discovered, chronic HFrEF, A-fib presented with shortness of breath and bilateral lower extremity swelling for a week. He has not been taking any of his meds for 2 wk as he ran out. Also complained of dizziness today.   He endorses PND orthopnea chest congestion wheezing, weakness. Patient otherwise denies any nausea, vomiting,fever, chills, headache, focal weakness, numbness tingling, speech difficulties. He is having constipation for 2 days.  Patient was seen in the cardiology office and sent to the ED. Recent hospitalization in March with A-fib and RVR-at that time refused cardioversion and heart cath. Sees Dr Odis Hollingshead. In ED initially BP 81/69> 79/61> 125/109, HR a fib in 105. Labs: bmp fairly stable, troponin 15, lactate 1.5 albumin 3.3. D DIMER 1.4, CBC with thrombocytopenia 105 CTA CHEST :pending Seen by cardiology and admission requestion for chf exacerbation Started on heparin amiodarone infusion w/ bolus, lasix x1.   Assessment/Plan Principal Problem:   Acute on chronic systolic CHF (congestive heart failure) (HCC) Active Problems:   Shortness of breath   Paroxysmal nocturnal dyspnea   Atrial fibrillation with rapid ventricular response (HCC)   Protein-calorie malnutrition, severe  A-fib with RVR Hypertension: Rate borderline controlled starting amiodarone without bolus, initially BP low but improved, currently no fever no leukocytosis no lactic acid elevation less likely infectious etiology.  Continue heparin infusion.  Monitoring telemetry  Acute on  HFrEF: Recently diagnosed cardiomyopathy of unknown etiology Chf/cardiomyopathy of unclear etiology,?  Secondary to A-fib with  RVR refused cardiac cath on last admission.  EF was 20-25% in March w/ global HNK. Cardiology has been consulted getting Lasix x 1 further diuretics/GDMT as per cardiology  Positive D-dimer CTA pending, duplex of the leg negative for DVT  Thrombocytopenia: Acute on chronic on last admission as low as 131, currently 105, monitor  Incidental finding of 2.3 cm nodular density in the right middle lung on last admission was advised noncontrast CT chest and follow-up  Protein-calorie malnutrition, severe- RD consulted Body mass index is 21.65 kg/m.   Severity of Illness: The appropriate patient status for this patient is INPATIENT. Inpatient status is judged to be reasonable and necessary in order to provide the required intensity of service to ensure the patient's safety. The patient's presenting symptoms, physical exam findings, and initial radiographic and laboratory data in the context of their chronic comorbidities is felt to place them at high risk for further clinical deterioration. Furthermore, it is not anticipated that the patient will be medically stable for discharge from the hospital within 2 midnights of admission.   * I certify that at the point of admission it is my clinical judgment that the patient will require inpatient hospital care spanning beyond 2 midnights from the point of admission due to high intensity of service, high risk for further deterioration and high frequency of surveillance required.*   DVT prophylaxis: heparin bolus via infusion 2,000 Units Start: 09/19/22 1800  Code Status:   Code Status: Full Code  Family Communication: Admission, patients condition and plan of care including tests being ordered have been discussed with the patient and wife and granddaughter who indicate understanding and agree with the plan and Code Status.  Consults called:  cardiology  Review of Systems: All systems were reviewed and were negative except as mentioned in HPI  above. Negative for fever Negative for chest pain Negative for focal weakness  Past Medical History:  Diagnosis Date   Atrial fibrillation (HCC)    Cardiomyopathy (HCC)    CHF (congestive heart failure) (HCC)     History reviewed. No pertinent surgical history.   reports that he has never smoked. He does not have any smokeless tobacco history on file. He reports current alcohol use. He reports that he does not currently use drugs after having used the following drugs: Marijuana and Cocaine.  No Known Allergies  Family History  Problem Relation Age of Onset   Breast cancer Mother    Stroke Brother      Prior to Admission medications   Medication Sig Start Date End Date Taking? Authorizing Provider  acetaminophen (TYLENOL) 325 MG tablet Take 650 mg by mouth every 6 (six) hours as needed for moderate pain.    [provider]  amiodarone (PACERONE) 200 MG tablet Take 1 tablet (200 mg total) by mouth daily. Patient taking differently: Take 200 mg by mouth 2 (two) times daily. 06/23/22 09/21/22  Tolia, Sunit, DO  apixaban (ELIQUIS) 5 MG TABS tablet Take 1 tablet (5 mg total) by mouth 2 (two) times daily. Patient not taking: Reported on 09/19/2022 06/23/22 09/21/22  Odis Hollingshead, Sunit, DO  empagliflozin (JARDIANCE) 10 MG TABS tablet Take 1 tablet (10 mg total) by mouth daily. Patient not taking: Reported on 09/19/2022 06/14/22   Kathlen Mody, MD  furosemide (LASIX) 20 MG tablet Take 20 mg by mouth daily as needed for fluid or edema (weight increase by 3 pounds over one day or 5 pounds over week.).    [provider]  metoprolol succinate (TOPROL-XL) 50 MG 24 hr tablet Take 1 tablet (50 mg total) by mouth daily. Take with or immediately following a meal. 06/14/22   Kathlen Mody, MD  sacubitril-valsartan (ENTRESTO) 24-26 MG Take 1 tablet by mouth 2 (two) times daily. Hold is SBP (top number) less than . 06/23/22   Tessa Lerner, DO    Physical Exam: Vitals:   09/19/22 1504  09/19/22 1524 09/19/22 1630  BP: (!) 81/69 (!) 79/62 (!) 125/109  Pulse: (!) 108  (!) 105  Resp: (!) 24  16  Temp: 98.1 F (36.7 C)    TempSrc: Oral    SpO2: 96%  98%  Weight:  91.6 kg   Height:  6\' 9"  (2.057 m)    General exam: AAOx3 , NAD, weak appearing. HEENT:Oral mucosa moist, Ear/Nose WNL grossly, dentition normal. Respiratory system: bilaterally crackles posteriorly,no wheezing or crackles,no use of accessory muscle Cardiovascular system: S1 & S2 +, No JVD, irregular. Gastrointestinal system: Abdomen soft, NT,ND, BS+ Nervous System:Alert, awake, moving extremities and grossly nonfocal Extremities: b/l ankle edema 2+, distal peripheral pulses palpable.  Skin: No rashes,no icterus. MSK: Normal muscle bulk,tone, power   Labs on Admission: I have personally reviewed following labs and imaging studies  CBC: Recent Labs  Lab 09/19/22 1548  WBC 6.1  NEUTROABS 4.3  HGB 13.3  HCT 39.9  MCV 94.1  PLT 105*   Basic Metabolic Panel: Recent Labs  Lab 09/19/22 1537  NA 141  K 3.6  CL 107  CO2 19*  GLUCOSE 131*  BUN 10  CREATININE 1.23  CALCIUM 8.8*   Recent Labs  Lab 09/19/22 1548  AST 25  ALT 34  ALKPHOS 57  BILITOT 3.5*  PROT 5.9*  ALBUMIN 3.3*   Recent Labs    06/26/22 1133  PROBNP 2,812*   Radiological Exams on Admission: VAS [place] LOWER EXTREMITY VENOUS (DVT)  Result Date: 09/19/2022  Lower Venous DVT Study Patient Name:  Garrett Carroll  Date of Exam:   09/19/2022 Medical Rec #: [id number]       Accession #:    [id number] Date of Birth: 07/21/1955       Patient Gender: M Patient Age:   89 years Exam Location:  [facility] Procedure:      VAS [place] LOWER EXTREMITY VENOUS (DVT) Referring Phys: ADAM CURATOLO --------------------------------------------------------------------------------  Indications: Edema, and SOB.  Comparison Study: No previous study. Performing Technologist: McKayla Maag RVT, [place]  Examination Guidelines: A complete evaluation includes  B-mode imaging, spectral Doppler, color Doppler, and power Doppler as needed of all accessible portions of each vessel. Bilateral testing is considered an integral part of a complete examination. Limited examinations for reoccurring indications may be performed as noted. The reflux portion of the exam is performed with the patient in reverse Trendelenburg.  +---------+---------------+---------+-----------+----------+--------------+ RIGHT    CompressibilityPhasicitySpontaneityPropertiesThrombus Aging +---------+---------------+---------+-----------+----------+--------------+ CFV      Full           Yes      Yes                                 +---------+---------------+---------+-----------+----------+--------------+ SFJ      Full                                                        +---------+---------------+---------+-----------+----------+--------------+ FV Prox  Full                                                        +---------+---------------+---------+-----------+----------+--------------+ FV Mid   Full                                                        +---------+---------------+---------+-----------+----------+--------------+ FV DistalFull                                                        +---------+---------------+---------+-----------+----------+--------------+ PFV      Full                                                        +---------+---------------+---------+-----------+----------+--------------+ POP      Full           Yes      Yes                                 +---------+---------------+---------+-----------+----------+--------------+  PTV      Full                                                        +---------+---------------+---------+-----------+----------+--------------+ PERO     Full                                                         +---------+---------------+---------+-----------+----------+--------------+   +----+---------------+---------+-----------+----------+--------------+ LEFTCompressibilityPhasicitySpontaneityPropertiesThrombus Aging +----+---------------+---------+-----------+----------+--------------+ CFV Full           Yes      Yes                                 +----+---------------+---------+-----------+----------+--------------+ SFJ Full                                                        +----+---------------+---------+-----------+----------+--------------+     Summary: RIGHT: - There is no evidence of deep vein thrombosis in the lower extremity.  - No cystic structure found in the popliteal fossa.  LEFT: - No evidence of common femoral vein obstruction.  *See table(s) above for measurements and observations.    Preliminary    DG Chest 2 View  Result Date: 09/19/2022 CLINICAL DATA:  Shortness of breath. EXAM: CHEST - 2 VIEW COMPARISON:  June 11, 2022. FINDINGS: Stable cardiomegaly. Mild bibasilar subsegmental atelectasis or edema is noted with small bilateral pleural effusions. Stable ovoid shaped nodular density seen in right midlung. Bony thorax is unremarkable. IMPRESSION: Cardiomegaly. Mild bibasilar subsegmental atelectasis or edema is noted with small pleural effusions. Grossly stable ovoid nodular density seen in right midlung; follow-up CT scan or chest radiograph in 3 months is recommended to ensure stability. Electronically Signed   By: Lupita Raider M.D.   On: 09/19/2022 16:30    Lanae Boast MD [facility]  If 7PM-7AM, please contact night-coverage [email or web address]  09/19/2022, 6:01 PM

## 2022-09-19 NOTE — ED Triage Notes (Addendum)
Pt c/o SOB and BLE swelling x1 week and dizziness starting today.  Denies pain.  Pt was seem by Cardiology and directed to ED.  Sts "they said my heart needs shocked."  Hx of CHF, cardiomyopathy, and A Fib.

## 2022-09-19 NOTE — Progress Notes (Addendum)
ANTICOAGULATION CONSULT NOTE - Initial Consult  Pharmacy Consult for heparin Indication: atrial fibrillation  No Known Allergies  Patient Measurements: Height: 6\' 9"  (205.7 cm) Weight: 91.6 kg (202 lb) IBW/kg (Calculated) : 98.3 Heparin Dosing Weight: 91.6 kg  Vital Signs: Temp: 98.1 F (36.7 C) (06/18 1504) Temp Source: Oral (06/18 1504) BP: 125/109 (06/18 1630) Pulse Rate: 105 (06/18 1630)  Labs: Recent Labs    09/19/22 1537 09/19/22 1548  HGB  --  13.3  HCT  --  39.9  PLT  --  105*  CREATININE 1.23  --   TROPONINIHS  --  15    Estimated Creatinine Clearance: 77.6 mL/min (by C-G formula based on SCr of 1.23 mg/dL).   Medical History: Past Medical History:  Diagnosis Date   Atrial fibrillation (HCC)    Cardiomyopathy (HCC)    CHF (congestive heart failure) (HCC)     Medications:  (Not in a hospital admission)  Scheduled:   furosemide  20 mg Intravenous Once   heparin  2,000 Units Intravenous Once   Assessment: 33 yoM who presented with SOB and bilateral leg swelling for 1 week. Patient past medical history includes CHF and Afib (eliquis- patient states out for 2 weeks, but not entirely sure). Pharmacy has been consulted to dose heparin for afib.  Given patient is unsure if had eliquis within 2 weeks and is stable with Hgb 13.3 an plts 105 will do a mini bolus of 2000 units (half of 40 units/kg *91.6 kg).  Goal of Therapy:  Heparin level 0.3-0.7 units/ml aPTT 66-102 seconds Monitor platelets by anticoagulation protocol: Yes   Plan:  Give 2000 units bolus of heparin Start heparin 1250 units/hr Follow up aPTT and heparin level in 6 hours Monitor aPTT and heparin level to make sure levels correlate and can do heparin level after Monitor for signs/symptoms of bleeding  Thanks,  Arabella Merles, PharmD. [facility] Acute [facility] PGY-1 09/19/2022 5:33 PM

## 2022-09-19 NOTE — ED Notes (Signed)
ED TO INPATIENT HANDOFF REPORT  ED Nurse Name and Phone #: Alycia Rossetti [phone]  S Name/Age/Gender Garrett Carroll 67 y.o. male Room/Bed: 032C/032C  Code Status   Code Status: Full Code  Home/SNF/Other Home Patient oriented to: self, place, time, and situation Is this baseline? Yes   Triage Complete: Triage complete  Chief Complaint Acute on chronic systolic CHF (congestive heart failure) (HCC) [I50.23]  Triage Note Pt c/o SOB and BLE swelling x1 week and dizziness starting today.  Denies pain.  Pt was seem by Cardiology and directed to ED.  Sts "they said my heart needs shocked."  Hx of CHF, cardiomyopathy, and A Fib.     Allergies No Known Allergies  Level of Care/Admitting Diagnosis ED Disposition     ED Disposition  Admit   Condition  --   Comment  Hospital Area: [facility] [[id number]]  Level of Care: Progressive [102]  Admit to Progressive based on following criteria: MULTISYSTEM THREATS such as stable sepsis, metabolic/electrolyte imbalance with or without encephalopathy that is responding to early treatment.  May admit patient to [facility] or [facility] if equivalent level of care is available:: No  Covid Evaluation: Asymptomatic - no recent exposure (last 10 days) testing not required  Diagnosis: Acute on chronic systolic CHF (congestive heart failure) [facility]) [[id number]]  Admitting Physician: Lanae Boast [[id number]]  Attending Physician: Lanae Boast [[id number]]  Certification:: I certify this patient will need inpatient services for at least 2 midnights  Estimated Length of Stay: 2          B Medical/Surgery History Past Medical History:  Diagnosis Date   Atrial fibrillation (HCC)    Cardiomyopathy (HCC)    CHF (congestive heart failure) (HCC)    History reviewed. No pertinent surgical history.   A IV Location/Drains/Wounds Patient Lines/Drains/Airways Status     Active Line/Drains/Airways     Name Placement date Placement time Site Days    Peripheral IV 09/19/22 18 G Anterior;Proximal;Right Forearm 09/19/22  1537  Forearm  less than 1   Peripheral IV 09/19/22 20 G Left Antecubital 09/19/22  1725  Antecubital  less than 1            Intake/Output Last 24 hours No intake or output data in the 24 hours ending 09/19/22 1914  Labs/Imaging Results for orders placed or performed during the hospital encounter of 09/19/22 (from the past 48 hour(s))  Basic metabolic panel     Status: Abnormal   Collection Time: 09/19/22  3:37 PM  Result Value Ref Range   Sodium 141 135 - 145 mmol/L   Potassium 3.6 3.5 - 5.1 mmol/L   Chloride 107 98 - 111 mmol/L   CO2 19 (L) 22 - 32 mmol/L   Glucose, Bld 131 (H) 70 - 99 mg/dL    Comment: Glucose reference range applies only to samples taken after fasting for at least 8 hours.   BUN 10 8 - 23 mg/dL   Creatinine, Ser 0.86 0.61 - 1.24 mg/dL   Calcium 8.8 (L) 8.9 - 10.3 mg/dL   GFR, Estimated >57 >84 mL/min    Comment: (NOTE) Calculated using the CKD-EPI Creatinine Equation (2021)    Anion gap 15 5 - 15    Comment: Performed at [facility] Lab, [address] [place], [place] [address]  Brain natriuretic peptide     Status: Abnormal   Collection Time: 09/19/22  3:45 PM  Result Value Ref Range   B Natriuretic Peptide 1,138.8 (H) 0.0 -  100.0 pg/mL    Comment: Performed at [facility] Lab, [address], [place], [place] [address]  Hepatic function panel     Status: Abnormal   Collection Time: 09/19/22  3:48 PM  Result Value Ref Range   Total Protein 5.9 (L) 6.5 - 8.1 g/dL   Albumin 3.3 (L) 3.5 - 5.0 g/dL   AST 25 15 - 41 U/L   ALT 34 0 - 44 U/L   Alkaline Phosphatase 57 38 - 126 U/L   Total Bilirubin 3.5 (H) 0.3 - 1.2 mg/dL   Bilirubin, Direct 0.3 (H) 0.0 - 0.2 mg/dL   Indirect Bilirubin 3.2 (H) 0.3 - 0.9 mg/dL    Comment: Performed at [facility], [address] [place], [place] [address]  Troponin I (High Sensitivity)     Status: None   Collection Time: 09/19/22  3:48  PM  Result Value Ref Range   Troponin I (High Sensitivity) 15 <18 ng/L    Comment: (NOTE) Elevated high sensitivity troponin I (hsTnI) values and significant  changes across serial measurements may suggest ACS but many other  chronic and acute conditions are known to elevate hsTnI results.  Refer to the "Links" section for chest pain algorithms and additional  guidance. Performed at [facility], [address], [place], [place] [address]   D-dimer, quantitative     Status: Abnormal   Collection Time: 09/19/22  3:48 PM  Result Value Ref Range   D-Dimer, Quant 1.46 (H) 0.00 - 0.50 ug/mL-FEU    Comment: (NOTE) At the manufacturer cut-off value of 0.5 g/mL FEU, this assay has a negative predictive value of 95-100%.This assay is intended for use in conjunction with a clinical pretest probability (PTP) assessment model to exclude pulmonary embolism (PE) and deep venous thrombosis (DVT) in outpatients suspected of PE or DVT. Results should be correlated with clinical presentation. Performed at [facility] Lab, [address], [place], [place] [address]   CBC with Differential     Status: Abnormal   Collection Time: 09/19/22  3:48 PM  Result Value Ref Range   WBC 6.1 4.0 - 10.5 K/uL   RBC 4.24 4.22 - 5.81 MIL/uL   Hemoglobin 13.3 13.0 - 17.0 g/dL   HCT 47.4 25.9 - 56.3 %   MCV 94.1 80.0 - 100.0 fL   MCH 31.4 26.0 - 34.0 pg   MCHC 33.3 30.0 - 36.0 g/dL   RDW 87.5 (H) 64.3 - 32.9 %   Platelets 105 (L) 150 - 400 K/uL    Comment: REPEATED TO VERIFY   nRBC 0.0 0.0 - 0.2 %   Neutrophils Relative % 72 %   Neutro Abs 4.3 1.7 - 7.7 K/uL   Lymphocytes Relative 19 %   Lymphs Abs 1.2 0.7 - 4.0 K/uL   Monocytes Relative 8 %   Monocytes Absolute 0.5 0.1 - 1.0 K/uL   Eosinophils Relative 0 %   Eosinophils Absolute 0.0 0.0 - 0.5 K/uL   Basophils Relative 1 %   Basophils Absolute 0.0 0.0 - 0.1 K/uL   Immature Granulocytes 0 %   Abs Immature Granulocytes 0.02 0.00 - 0.07 K/uL     Comment: Performed at [facility], [address], [place], [place] [address]  Troponin I (High Sensitivity)     Status: None   Collection Time: 09/19/22  4:24 PM  Result Value Ref Range   Troponin I (High Sensitivity) 12 <18 ng/L    Comment: (NOTE) Elevated  high sensitivity troponin I (hsTnI) values and significant  changes across serial measurements may suggest ACS but many other  chronic and acute conditions are known to elevate hsTnI results.  Refer to the "Links" section for chest pain algorithms and additional  guidance. Performed at [facility], [address], [place], [place] [address]   Lactic acid, plasma     Status: None   Collection Time: 09/19/22  4:25 PM  Result Value Ref Range   Lactic Acid, Venous 1.5 0.5 - 1.9 mmol/L    Comment: Performed at [facility], [address], [place], [place] [address]   CT Angio Chest PE W and/or Wo Contrast  Result Date: 09/19/2022 CLINICAL DATA:  Shortness of breath and lower extremity swelling for 1 week. Dizziness starting today. EXAM: CT ANGIOGRAPHY CHEST WITH CONTRAST TECHNIQUE: Multidetector CT imaging of the chest was performed using the standard protocol during bolus administration of intravenous contrast. Multiplanar CT image reconstructions and MIPs were obtained to evaluate the vascular anatomy. RADIATION DOSE REDUCTION: This exam was performed according to the departmental dose-optimization program which includes automated exposure control, adjustment of the mA and/or kV according to patient size and/or use of iterative reconstruction technique. CONTRAST:  75mL OMNIPAQUE IOHEXOL 350 MG/ML SOLN COMPARISON:  09/19/2022 chest radiograph FINDINGS: Cardiovascular: No filling defect is identified in the pulmonary arterial tree to suggest pulmonary embolus. Ascending thoracic aortic aneurysm 4.1 cm in diameter. Prominent main pulmonary artery favoring pulmonary arterial hypertension. Moderate cardiomegaly. There is no  significant systemic arterial contrast opacification on today's exam which was timed for pulmonary arterial opacification. Mediastinum/Nodes: Hazy density in the mediastinum and subcutaneous tissues favoring mild third spacing of fluid. Lungs/Pleura: Small left and small to moderate right pleural effusions. The oval-shaped density seen on recent chest radiography is immediately along the minor fissure and is of low-density, favoring a small amount of fluid in the minor fissure. There is bandlike atelectasis in the right middle lobe and passive atelectasis associated with both pleural effusions. Mild subsegmental atelectasis in the lingula. There is some hazy dependent density in both lungs probably from low-grade edema. Upper Abdomen: Suspected mild perihepatic ascites. Suspected mild fluid infiltration along upper abdominal adipose tissues. Musculoskeletal: Thoracic spondylosis. Review of the MIP images confirms the above findings. IMPRESSION: 1. No filling defect is identified in the pulmonary arterial tree to suggest pulmonary embolus. 2. Small left and small to moderate right pleural effusions with passive atelectasis associated with both pleural effusions. The oval-shaped density seen on recent chest radiography is immediately along the minor fissure and is of low-density, favoring a small amount of fluid in the minor fissure. 3. Moderate cardiomegaly. 4. Prominent main pulmonary artery favoring pulmonary arterial hypertension. 5. Suspected third spacing of fluid with hazy density in mediastinal, subcutaneous, and upper abdominal adipose tissues and suspected mild perihepatic ascites. 6. Ascending thoracic aortic aneurysm 4.1 cm in diameter. Recommend annual imaging followup by CTA or MRA. This recommendation follows 2010 ACCF/AHA/AATS/ACR/ASA/SCA/SCAI/SIR/STS/SVM Guidelines for the Diagnosis and Management of Patients with Thoracic Aortic Disease. Circulation. 2010; 121: U981-X914. Aortic aneurysm NOS  (ICD10-I71.9) 7. Thoracic spondylosis. Electronically Signed   By: Gaylyn Rong M.D.   On: 09/19/2022 18:43   VAS [place] LOWER EXTREMITY VENOUS (DVT)  Result Date: 09/19/2022  Lower Venous DVT Study Patient Name:  Garrett Carroll  Date of Exam:   09/19/2022 Medical Rec #: [id number]       Accession #:    [id number] Date of Birth: 10-13-1955  Patient Gender: M Patient Age:   77 years Exam Location:  [facility] Procedure:      VAS [place] LOWER EXTREMITY VENOUS (DVT) Referring Phys: ADAM CURATOLO --------------------------------------------------------------------------------  Indications: Edema, and SOB.  Comparison Study: No previous study. Performing Technologist: McKayla Maag RVT, [place]  Examination Guidelines: A complete evaluation includes B-mode imaging, spectral Doppler, color Doppler, and power Doppler as needed of all accessible portions of each vessel. Bilateral testing is considered an integral part of a complete examination. Limited examinations for reoccurring indications may be performed as noted. The reflux portion of the exam is performed with the patient in reverse Trendelenburg.  +---------+---------------+---------+-----------+----------+--------------+ RIGHT    CompressibilityPhasicitySpontaneityPropertiesThrombus Aging +---------+---------------+---------+-----------+----------+--------------+ CFV      Full           Yes      Yes                                 +---------+---------------+---------+-----------+----------+--------------+ SFJ      Full                                                        +---------+---------------+---------+-----------+----------+--------------+ FV Prox  Full                                                        +---------+---------------+---------+-----------+----------+--------------+ FV Mid   Full                                                         +---------+---------------+---------+-----------+----------+--------------+ FV DistalFull                                                        +---------+---------------+---------+-----------+----------+--------------+ PFV      Full                                                        +---------+---------------+---------+-----------+----------+--------------+ POP      Full           Yes      Yes                                 +---------+---------------+---------+-----------+----------+--------------+ PTV      Full                                                        +---------+---------------+---------+-----------+----------+--------------+  PERO     Full                                                        +---------+---------------+---------+-----------+----------+--------------+   +----+---------------+---------+-----------+----------+--------------+ LEFTCompressibilityPhasicitySpontaneityPropertiesThrombus Aging +----+---------------+---------+-----------+----------+--------------+ CFV Full           Yes      Yes                                 +----+---------------+---------+-----------+----------+--------------+ SFJ Full                                                        +----+---------------+---------+-----------+----------+--------------+     Summary: RIGHT: - There is no evidence of deep vein thrombosis in the lower extremity.  - No cystic structure found in the popliteal fossa.  LEFT: - No evidence of common femoral vein obstruction.  *See table(s) above for measurements and observations. Electronically signed by Waverly Ferrari MD on 09/19/2022 at 6:19:35 PM.    Final    DG Chest 2 View  Result Date: 09/19/2022 CLINICAL DATA:  Shortness of breath. EXAM: CHEST - 2 VIEW COMPARISON:  June 11, 2022. FINDINGS: Stable cardiomegaly. Mild bibasilar subsegmental atelectasis or edema is noted with small bilateral pleural effusions. Stable  ovoid shaped nodular density seen in right midlung. Bony thorax is unremarkable. IMPRESSION: Cardiomegaly. Mild bibasilar subsegmental atelectasis or edema is noted with small pleural effusions. Grossly stable ovoid nodular density seen in right midlung; follow-up CT scan or chest radiograph in 3 months is recommended to ensure stability. Electronically Signed   By: Lupita Raider M.D.   On: 09/19/2022 16:30    Pending Labs Unresulted Labs (From admission, onward)     Start     Ordered   09/20/22 0500  TSH  Tomorrow morning,   R        09/19/22 1759   09/20/22 0500  Comprehensive metabolic panel  Tomorrow morning,   R        09/19/22 1759   09/20/22 0500  CBC  Tomorrow morning,   R        09/19/22 1759   09/20/22 0100  APTT  Once-Timed,   TIMED        09/19/22 1806   09/20/22 0100  Heparin level (unfractionated)  Once-Timed,   TIMED        09/19/22 1806   09/19/22 1907  Rapid urine drug screen (hospital performed)  ONCE - STAT,   STAT        09/19/22 1906   09/19/22 1545  Lactic acid, plasma  Now then every 2 hours,   R (with STAT occurrences)      09/19/22 1547   09/19/22 1545  Blood culture (routine x 2)  BLOOD CULTURE X 2,   R (with STAT occurrences)      09/19/22 1547            Vitals/Pain Today's Vitals   09/19/22 1504 09/19/22 1524 09/19/22 1630 09/19/22 1834  BP: (!) 81/69 (!) 79/62 (!) 125/109 122/84  Pulse: (!) 108  (!) 105 (!) 119  Resp: (!) 24  16 16   Temp: 98.1 F (36.7 C)     TempSrc: Oral     SpO2: 96%  98% 98%  Weight:  91.6 kg    Height:  6\' 9"  (2.057 m)    PainSc:  0-No pain      Isolation Precautions No active isolations  Medications Medications  amiodarone (NEXTERONE PREMIX) 360-4.14 MG/200ML-% (1.8 mg/mL) IV infusion (60 mg/hr Intravenous New Bag/Given 09/19/22 1825)  amiodarone (NEXTERONE PREMIX) 360-4.14 MG/200ML-% (1.8 mg/mL) IV infusion (has no administration in time range)  heparin ADULT infusion 100 units/mL (25000 units/241mL) (1,250  Units/hr Intravenous New Bag/Given 09/19/22 1827)  acetaminophen (TYLENOL) tablet 650 mg (has no administration in time range)    Or  acetaminophen (TYLENOL) suppository 650 mg (has no administration in time range)  ondansetron (ZOFRAN) tablet 4 mg (has no administration in time range)    Or  ondansetron (ZOFRAN) injection 4 mg (has no administration in time range)  senna-docusate (Senokot-S) tablet 1 tablet (has no administration in time range)  dapagliflozin propanediol (FARXIGA) tablet 10 mg (has no administration in time range)  furosemide (LASIX) injection 20 mg (20 mg Intravenous Given 09/19/22 1815)  heparin bolus via infusion 2,000 Units (2,000 Units Intravenous Bolus from Bag 09/19/22 1827)  iohexol (OMNIPAQUE) 350 MG/ML injection 75 mL (75 mLs Intravenous Contrast Given 09/19/22 1758)    Mobility walks     Focused Assessments Cardiac Assessment Handoff:  Cardiac Rhythm: Atrial fibrillation No results found for: "CKTOTAL", "CKMB", "CKMBINDEX", "TROPONINI" Lab Results  Component Value Date   DDIMER 1.46 (H) 09/19/2022   Does the Patient currently have chest pain? No    R Recommendations: See Admitting Provider Note  Report given to:   Additional Notes: Pleasant patient, A&Ox4

## 2022-09-20 ENCOUNTER — Inpatient Hospital Stay (HOSPITAL_COMMUNITY): Payer: BC Managed Care – PPO

## 2022-09-20 DIAGNOSIS — I5023 Acute on chronic systolic (congestive) heart failure: Secondary | ICD-10-CM | POA: Diagnosis not present

## 2022-09-20 LAB — ECHOCARDIOGRAM LIMITED
Est EF: <20
Height: 81 in
MV M vel: 3.44 m/s
MV Peak grad: 47.3 mmHg
Radius: 1 cm
S' Lateral: 6.4 cm
Weight: 3168 oz

## 2022-09-20 LAB — CBC
HCT: 40 % (ref 39.0–52.0)
Hemoglobin: 13.3 g/dL (ref 13.0–17.0)
MCH: 30.7 pg (ref 26.0–34.0)
MCHC: 33.3 g/dL (ref 30.0–36.0)
MCV: 92.4 fL (ref 80.0–100.0)
Platelets: 103 10*3/uL — ABNORMAL LOW (ref 150–400)
RBC: 4.33 MIL/uL (ref 4.22–5.81)
RDW: 15.7 % — ABNORMAL HIGH (ref 11.5–15.5)
WBC: 5.9 10*3/uL (ref 4.0–10.5)
nRBC: 0 % (ref 0.0–0.2)

## 2022-09-20 LAB — COMPREHENSIVE METABOLIC PANEL
ALT: 30 U/L (ref 0–44)
AST: 21 U/L (ref 15–41)
Albumin: 3.1 g/dL — ABNORMAL LOW (ref 3.5–5.0)
Alkaline Phosphatase: 52 U/L (ref 38–126)
Anion gap: 11 (ref 5–15)
BUN: 11 mg/dL (ref 8–23)
CO2: 19 mmol/L — ABNORMAL LOW (ref 22–32)
Calcium: 8.4 mg/dL — ABNORMAL LOW (ref 8.9–10.3)
Chloride: 108 mmol/L (ref 98–111)
Creatinine, Ser: 1.17 mg/dL (ref 0.61–1.24)
GFR, Estimated: >60 mL/min (ref >60)
Glucose, Bld: 113 mg/dL — ABNORMAL HIGH (ref 70–99)
Potassium: 3.6 mmol/L (ref 3.5–5.1)
Sodium: 138 mmol/L (ref 135–145)
Total Bilirubin: 2.7 mg/dL — ABNORMAL HIGH (ref 0.3–1.2)
Total Protein: 5.6 g/dL — ABNORMAL LOW (ref 6.5–8.1)

## 2022-09-20 LAB — TSH: TSH: 16.79 u[IU]/mL — ABNORMAL HIGH (ref 0.350–4.500)

## 2022-09-20 LAB — HEPARIN LEVEL (UNFRACTIONATED): Heparin Unfractionated: 0.38 IU/mL (ref 0.30–0.70)

## 2022-09-20 LAB — CULTURE, BLOOD (ROUTINE X 2)

## 2022-09-20 LAB — APTT: aPTT: 85 seconds — ABNORMAL HIGH (ref 24–36)

## 2022-09-20 MED ORDER — ORAL CARE MOUTH RINSE: 15.0000 mL | OROMUCOSAL | Status: DC | PRN

## 2022-09-20 MED ORDER — SODIUM CHLORIDE 0.9 % IV SOLN: 250.0000 mL | INTRAVENOUS | Status: DC | PRN

## 2022-09-20 MED ORDER — SODIUM CHLORIDE 0.9% FLUSH
3.0000 mL | Freq: Two times a day (BID) | INTRAVENOUS | Status: DC
  Administered 2022-09-21 – 2022-09-22 (×2): 3 mL via INTRAVENOUS

## 2022-09-20 MED ORDER — SODIUM CHLORIDE 0.9% FLUSH: 3.0000 mL | INTRAVENOUS | Status: DC | PRN

## 2022-09-20 MED ORDER — POTASSIUM CHLORIDE CRYS ER 20 MEQ PO TBCR
40.0000 meq | EXTENDED_RELEASE_TABLET | Freq: Once | ORAL | Status: AC
  Administered 2022-09-20: 40 meq via ORAL
  Filled 2022-09-20: qty 2

## 2022-09-20 MED ORDER — AMIODARONE HCL 200 MG PO TABS
200.0000 mg | ORAL_TABLET | Freq: Two times a day (BID) | ORAL | Status: DC
  Administered 2022-09-20 – 2022-09-23 (×6): 200 mg via ORAL
  Filled 2022-09-20 (×6): qty 1

## 2022-09-20 MED ORDER — PERFLUTREN LIPID MICROSPHERE
1.0000 mL | INTRAVENOUS | Status: AC | PRN
  Administered 2022-09-20: 2 mL via INTRAVENOUS

## 2022-09-20 MED ORDER — FUROSEMIDE 10 MG/ML IJ SOLN
20.0000 mg | Freq: Two times a day (BID) | INTRAMUSCULAR | Status: DC
  Administered 2022-09-20: 20 mg via INTRAVENOUS
  Filled 2022-09-20: qty 2

## 2022-09-20 MED ORDER — FUROSEMIDE 10 MG/ML IJ SOLN
40.0000 mg | Freq: Two times a day (BID) | INTRAMUSCULAR | Status: DC
  Administered 2022-09-20: 40 mg via INTRAVENOUS
  Filled 2022-09-20 (×2): qty 4

## 2022-09-20 MED ORDER — SODIUM CHLORIDE 0.9 % IV SOLN: INTRAVENOUS | Status: DC

## 2022-09-20 NOTE — H&P (View-Only) (Signed)
Progress Note  Patient Name: Garrett Carroll MRN: [id number] DOB: 27-Feb-1956 Date of Encounter: 09/20/2022  Attending physician: Zannie Cove, MD Primary care provider: Mirna Mires, MD Primary Cardiologist: Tessa Lerner, DO, [facility]  Subjective: Garrett Carroll is a 67 y.o. African-American male who was seen and examined at bedside  Denies chest pain. Shortness of breath is improving. I's and O's likely not accurate, documented urine output a 50 cc and documented weight loss of 4 pounds.  Objective: Vital Signs in the last 24 hours: Temp:  [97.5 F (36.4 C)-98.1 F (36.7 C)] 97.5 F (36.4 C) (06/19 0742) Pulse Rate:  [72-119] 92 (06/19 0742) Resp:  [16-24] 17 (06/19 0742) BP: (79-125)/(62-109) 89/69 (06/19 0742) SpO2:  [94 %-98 %] 94 % (06/18 2024) Weight:  [89.8 kg-91.6 kg] 89.8 kg (06/19 0410)  Intake/Output:  Intake/Output Summary (Last 24 hours) at 09/20/2022 1046 Last data filed at 09/20/2022 0600 Gross per 24 hour  Intake 438.05 ml  Output 850 ml  Net -411.95 ml    Net IO Since Admission: -411.95 mL [09/20/22 1046]  Weights:     09/20/2022    4:10 AM 09/19/2022    3:24 PM 09/19/2022   11:48 AM  Last 3 Weights  Weight (lbs) 198 lb 202 lb 202 lb  Weight (kg) 89.812 kg 91.627 kg 91.627 kg      Telemetry:  Overnight telemetry shows controlled A-fib with rare PVCs, which I personally reviewed.   Physical examination: PHYSICAL EXAM: Vitals:   09/19/22 1834 09/19/22 2024 09/20/22 0410 09/20/22 0742  BP: 122/84 99/75 101/74 (!) 89/69  Pulse: (!) 119 98 (!) 105 92  Resp: 16 19 17 17   Temp:  98 F (36.7 C) 97.7 F (36.5 C) (!) 97.5 F (36.4 C)  TempSrc:  Oral Oral Oral  SpO2: 98% 94%    Weight:   89.8 kg   Height:        Physical Exam  Constitutional: No distress.  Appears older than stated age, no acute distress, hemodynamically stable.  Neck: JVD present.  Cardiovascular: Normal rate, S1 normal, S2 normal, intact distal pulses and normal pulses. An  irregularly irregular rhythm present. Exam reveals no gallop, no S3 and no S4.  Murmur heard. Holosystolic murmur is present with a grade of 3/6 at the apex. Pulmonary/Chest: No stridor. He has no wheezes. He has no rales.  Decreased breath sounds bilaterally.  Abdominal: Soft. Bowel sounds are normal. He exhibits no distension. There is no abdominal tenderness.  Musculoskeletal:        General: Edema (Bilateral +1) present.     Cervical back: Neck supple.     Comments: Warm to touch bilaterally.  Neurological: He is alert and oriented to person, place, and time. He has intact cranial nerves (2-12).  Skin: Skin is warm and moist.   Lab Results: Chemistry Recent Labs  Lab 09/19/22 1537 09/19/22 1548 09/20/22 0040  NA 141  --  138  K 3.6  --  3.6  CL 107  --  108  CO2 19*  --  19*  GLUCOSE 131*  --  113*  BUN 10  --  11  CREATININE 1.23  --  1.17  CALCIUM 8.8*  --  8.4*  PROT  --  5.9* 5.6*  ALBUMIN  --  3.3* 3.1*  AST  --  25 21  ALT  --  34 30  ALKPHOS  --  57 52  BILITOT  --  3.5* 2.7*  GFRNONAA >60  --  >  60  ANIONGAP 15  --  11    Hematology Recent Labs  Lab 09/19/22 1548 09/20/22 0040  WBC 6.1 5.9  RBC 4.24 4.33  HGB 13.3 13.3  HCT 39.9 40.0  MCV 94.1 92.4  MCH 31.4 30.7  MCHC 33.3 33.3  RDW 15.7* 15.7*  PLT 105* 103*   High Sensitivity Troponin:   Recent Labs  Lab 09/19/22 1548 09/19/22 1624  TROPONINIHS 15 12     Cardiac EnzymesNo results for input(s): "TROPONINI" in the last 168 hours. No results for input(s): "TROPIPOC" in the last 168 hours.  BNP Recent Labs  Lab 09/19/22 1545  BNP 1,138.8*    DDimer  Recent Labs  Lab 09/19/22 1548  DDIMER 1.46*    Hemoglobin A1c:  Lab Results  Component Value Date   HGBA1C 5.8 (H) 06/12/2022   MPG 120 06/12/2022   TSH  Recent Labs    06/11/22 1524 09/20/22 0040  TSH 2.376 16.790*   Lipid Panel  Lab Results  Component Value Date   CHOL 121 06/12/2022   HDL 54 06/12/2022   LDLCALC 61  06/12/2022   LDLDIRECT 62 06/12/2022   TRIG 32 06/12/2022   CHOLHDL 2.2 06/12/2022   Drugs of Abuse     Component Value Date/Time   LABOPIA NONE DETECTED 09/19/2022 2019   COCAINSCRNUR NONE DETECTED 09/19/2022 2019   LABBENZ NONE DETECTED 09/19/2022 2019   AMPHETMU NONE DETECTED 09/19/2022 2019   THCU NONE DETECTED 09/19/2022 2019   LABBARB NONE DETECTED 09/19/2022 2019      Imaging: CT Angio Chest PE W and/or Wo Contrast  Result Date: 09/19/2022 CLINICAL DATA:  Shortness of breath and lower extremity swelling for 1 week. Dizziness starting today. EXAM: CT ANGIOGRAPHY CHEST WITH CONTRAST TECHNIQUE: Multidetector CT imaging of the chest was performed using the standard protocol during bolus administration of intravenous contrast. Multiplanar CT image reconstructions and MIPs were obtained to evaluate the vascular anatomy. RADIATION DOSE REDUCTION: This exam was performed according to the departmental dose-optimization program which includes automated exposure control, adjustment of the mA and/or kV according to patient size and/or use of iterative reconstruction technique. CONTRAST:  75mL OMNIPAQUE IOHEXOL 350 MG/ML SOLN COMPARISON:  09/19/2022 chest radiograph FINDINGS: Cardiovascular: No filling defect is identified in the pulmonary arterial tree to suggest pulmonary embolus. Ascending thoracic aortic aneurysm 4.1 cm in diameter. Prominent main pulmonary artery favoring pulmonary arterial hypertension. Moderate cardiomegaly. There is no significant systemic arterial contrast opacification on today's exam which was timed for pulmonary arterial opacification. Mediastinum/Nodes: Hazy density in the mediastinum and subcutaneous tissues favoring mild third spacing of fluid. Lungs/Pleura: Small left and small to moderate right pleural effusions. The oval-shaped density seen on recent chest radiography is immediately along the minor fissure and is of low-density, favoring a small amount of fluid in the  minor fissure. There is bandlike atelectasis in the right middle lobe and passive atelectasis associated with both pleural effusions. Mild subsegmental atelectasis in the lingula. There is some hazy dependent density in both lungs probably from low-grade edema. Upper Abdomen: Suspected mild perihepatic ascites. Suspected mild fluid infiltration along upper abdominal adipose tissues. Musculoskeletal: Thoracic spondylosis. Review of the MIP images confirms the above findings. IMPRESSION: 1. No filling defect is identified in the pulmonary arterial tree to suggest pulmonary embolus. 2. Small left and small to moderate right pleural effusions with passive atelectasis associated with both pleural effusions. The oval-shaped density seen on recent chest radiography is immediately along the minor fissure and is of  low-density, favoring a small amount of fluid in the minor fissure. 3. Moderate cardiomegaly. 4. Prominent main pulmonary artery favoring pulmonary arterial hypertension. 5. Suspected third spacing of fluid with hazy density in mediastinal, subcutaneous, and upper abdominal adipose tissues and suspected mild perihepatic ascites. 6. Ascending thoracic aortic aneurysm 4.1 cm in diameter. Recommend annual imaging followup by CTA or MRA. This recommendation follows 2010 ACCF/AHA/AATS/ACR/ASA/SCA/SCAI/SIR/STS/SVM Guidelines for the Diagnosis and Management of Patients with Thoracic Aortic Disease. Circulation. 2010; 121: Z610-R604. Aortic aneurysm NOS (ICD10-I71.9) 7. Thoracic spondylosis. Electronically Signed   By: Gaylyn Rong M.D.   On: 09/19/2022 18:43   VAS [place] LOWER EXTREMITY VENOUS (DVT)  Result Date: 09/19/2022  Lower Venous DVT Study Patient Name:  WALFRED FARSON  Date of Exam:   09/19/2022 Medical Rec #: [id number]       Accession #:    [id number] Date of Birth: 1956-02-10       Patient Gender: M Patient Age:   50 years Exam Location:  [facility] Procedure:      VAS [place] LOWER EXTREMITY VENOUS  (DVT) Referring Phys: ADAM CURATOLO --------------------------------------------------------------------------------  Indications: Edema, and SOB.  Comparison Study: No previous study. Performing Technologist: McKayla Maag RVT, VT  Examination Guidelines: A complete evaluation includes B-mode imaging, spectral Doppler, color Doppler, and power Doppler as needed of all accessible portions of each vessel. Bilateral testing is considered an integral part of a complete examination. Limited examinations for reoccurring indications may be performed as noted. The reflux portion of the exam is performed with the patient in reverse Trendelenburg.  +---------+---------------+---------+-----------+----------+--------------+ RIGHT    CompressibilityPhasicitySpontaneityPropertiesThrombus Aging +---------+---------------+---------+-----------+----------+--------------+ CFV      Full           Yes      Yes                                 +---------+---------------+---------+-----------+----------+--------------+ SFJ      Full                                                        +---------+---------------+---------+-----------+----------+--------------+ FV Prox  Full                                                        +---------+---------------+---------+-----------+----------+--------------+ FV Mid   Full                                                        +---------+---------------+---------+-----------+----------+--------------+ FV DistalFull                                                        +---------+---------------+---------+-----------+----------+--------------+ PFV      Full                                                        +---------+---------------+---------+-----------+----------+--------------+  POP      Full           Yes      Yes                                 +---------+---------------+---------+-----------+----------+--------------+ PTV       Full                                                        +---------+---------------+---------+-----------+----------+--------------+ PERO     Full                                                        +---------+---------------+---------+-----------+----------+--------------+   +----+---------------+---------+-----------+----------+--------------+ LEFTCompressibilityPhasicitySpontaneityPropertiesThrombus Aging +----+---------------+---------+-----------+----------+--------------+ CFV Full           Yes      Yes                                 +----+---------------+---------+-----------+----------+--------------+ SFJ Full                                                        +----+---------------+---------+-----------+----------+--------------+     Summary: RIGHT: - There is no evidence of deep vein thrombosis in the lower extremity.  - No cystic structure found in the popliteal fossa.  LEFT: - No evidence of common femoral vein obstruction.  *See table(s) above for measurements and observations. Electronically signed by Waverly Ferrari MD on 09/19/2022 at 6:19:35 PM.    Final    DG Chest 2 View  Result Date: 09/19/2022 CLINICAL DATA:  Shortness of breath. EXAM: CHEST - 2 VIEW COMPARISON:  June 11, 2022. FINDINGS: Stable cardiomegaly. Mild bibasilar subsegmental atelectasis or edema is noted with small bilateral pleural effusions. Stable ovoid shaped nodular density seen in right midlung. Bony thorax is unremarkable. IMPRESSION: Cardiomegaly. Mild bibasilar subsegmental atelectasis or edema is noted with small pleural effusions. Grossly stable ovoid nodular density seen in right midlung; follow-up CT scan or chest radiograph in 3 months is recommended to ensure stability. Electronically Signed   By: Lupita Raider M.D.   On: 09/19/2022 16:30    CARDIAC DATABASE: EKG: 06/19/2022: Atrial fibrillation, 100 bpm, LVH with voltage, PVCs, prolonged QT.     Echocardiogram: 06/12/2022:   1. Left ventricular ejection fraction, by estimation, is 20 to 25%. The left ventricle has severely decreased function. The left ventricle demonstrates global hypokinesis. The left ventricular internal cavity size was moderately dilated. Left  ventricular diastolic function could not be evaluated.  2. Right ventricular systolic function is severely reduced. The right ventricular size is severely enlarged.  3. Left atrial size was severely dilated.  4. Right atrial size was severely dilated.  5. The mitral valve is normal in structure. Severe mitral valve regurgitation. No evidence of mitral stenosis.  6. Tricuspid valve regurgitation is severe.  7. The aortic valve is normal  in structure. Aortic valve regurgitation is not visualized. No aortic stenosis is present.  8. Aortic dilatation noted. There is borderline dilatation of the aortic root, measuring 39 mm. There is borderline dilatation of the ascending aorta, measuring 38 mm.  9. The inferior vena cava is dilated in size with <50% respiratory variability, suggesting right atrial pressure of 15 mmHg.  Scheduled Meds:  amiodarone  200 mg Oral BID   dapagliflozin propanediol  10 mg Oral Daily   furosemide  40 mg Intravenous BID    Continuous Infusions:  amiodarone 30 mg/hr (09/20/22 0600)   heparin 1,250 Units/hr (09/20/22 0600)    PRN Meds: acetaminophen **OR** acetaminophen, ondansetron **OR** ondansetron (ZOFRAN) IV, mouth rinse, senna-docusate   IMPRESSION & RECOMMENDATIONS: Garrett Carroll is a 67 y.o. African-American male whose past medical history and cardiac risk factors include: Cardiomyopathy, HFrEF, atrial fibrillation, lung nodule, noncompliance.  Impression: Acute biventricular heart failure, stage C, NYHA class III. Persistent atrial fibrillation with controlled ventricular rate Cardiomyopathy, suspect nonischemic Long-term oral anticoagulation. Long-term antiarrhythmic  medications. Ascending thoracic aortic aneurysm of 4.1 cm (June 2024) Medication noncompliance. Hypothyroidism  Plan: Acute biventricular heart failure, stage C, NYHA class III Hospitalized due to 20 pounds of weight gain compared to last office visit and symptomatic CHF symptoms. Focusing on diuresis. Suspect I's and O's are not charted accurately when compared to his daily weights. Strict I's and O's reemphasized to nursing staff and patient. Increase Lasix to 40 mg p.o. twice daily. Continue Marcelline Deist Given his soft blood pressures we will hold off on GDMT and focus on diuresis. Limited echocardiogram scheduled for later today to rule out LV thrombus. Recommended left and right heart catheterization to evaluate for obstructive CAD and hemodynamics respectively.  Risks, benefits, alternatives, and limitations discussed with both patient and his wife over the phone.  They both are in agreement with proceeding with left and right heart catheterization questions and concerns addressed to their satisfaction.  The procedure of left and right heart catheterization with possible intervention was explained to the patient and wife (over the phone) in detail.  The indication, alternatives, risks and benefits were reviewed.  Complications include but not limited to bleeding, infection, vascular injury, stroke, myocardial infarction, arrhythmia (requiring medical or cardiopulmonary resuscitation), kidney injury (requiring short-term or long-term hemodialysis), radiation-related injury in the case of prolonged fluoroscopy use, emergent cardiac surgery, temporary or permanent pacemaker, and death. The patient and wife understands the risks of serious complication is 1-2 in 1000 with diagnostic cardiac cath and 1-2% or less with angioplasty/stenting. The patient and wife voices understanding and provides verbal feedback their questions and concerns are addressed to their satisfaction and patient wishes to  proceed with coronary angiography with possible PCI.  Persistent atrial fibrillation with controlled ventricular rate: Ventricular rate improving. Continue IV amiodarone for total of 24 hours and will transition to p.o. Currently on IV heparin drip will transition to DOAC after the heart catheterization tomorrow. Patient has been noncompliant with anticoagulation in the past. He would benefit from rhythm control given his biventricular heart failure. Reemphasized the importance of medication compliance if we were to proceed with transesophageal guided cardioversion.  Risks, benefits, alternatives of this discussed.  Patient and wife would like to discuss this further for now I will request a procedure spot for Friday, September 21, 2022.  Ascending thoracic aortic aneurysm: 41 mm as of June 2024. Yearly follow-up recommended by radiology.  Elevated D-dimers: DVT study negative for deep venous thrombosis CT PE study negative for  pulmonary embolism  Hypothyroidism: Will defer management to primary team.  Noncompliance: Despite his comorbidities he has followed up outpatient once since his last discharge in March 2024.  He is also been noncompliant with medical therapy.  Patient's questions and concerns were addressed to his satisfaction. He voices understanding of the instructions provided during this encounter.   This note was created using a voice recognition software as a result there may be grammatical errors inadvertently enclosed that do not reflect the nature of this encounter. Every attempt is made to correct such errors.  Delilah Shan [facility]  Pager:  [phone] Office: [phone] 09/20/2022, 10:46 AM

## 2022-09-20 NOTE — Plan of Care (Signed)
  Problem: Education: Goal: Ability to demonstrate management of disease process will improve Outcome: Progressing Goal: Ability to verbalize understanding of medication therapies will improve Outcome: Progressing Goal: Individualized Educational Video(s) Outcome: Progressing   Problem: Activity: Goal: Capacity to carry out activities will improve Outcome: Progressing   Problem: Cardiac: Goal: Ability to achieve and maintain adequate cardiopulmonary perfusion will improve Outcome: Progressing   Problem: Education: Goal: Knowledge of disease or condition will improve Outcome: Progressing Goal: Understanding of medication regimen will improve Outcome: Progressing Goal: Individualized Educational Video(s) Outcome: Progressing   Problem: Activity: Goal: Ability to tolerate increased activity will improve Outcome: Progressing   Problem: Cardiac: Goal: Ability to achieve and maintain adequate cardiopulmonary perfusion will improve Outcome: Progressing   Problem: Health Behavior/Discharge Planning: Goal: Ability to safely manage health-related needs after discharge will improve Outcome: Progressing   Problem: Education: Goal: Knowledge of General Education information will improve Description: Including pain rating scale, medication(s)/side effects and non-pharmacologic comfort measures Outcome: Progressing   Problem: Health Behavior/Discharge Planning: Goal: Ability to manage health-related needs will improve Outcome: Progressing   Problem: Clinical Measurements: Goal: Ability to maintain clinical measurements within normal limits will improve Outcome: Progressing Goal: Will remain free from infection Outcome: Progressing Goal: Diagnostic test results will improve Outcome: Progressing Goal: Respiratory complications will improve Outcome: Progressing Goal: Cardiovascular complication will be avoided Outcome: Progressing   Problem: Activity: Goal: Risk for activity  intolerance will decrease Outcome: Progressing   Problem: Nutrition: Goal: Adequate nutrition will be maintained Outcome: Progressing   Problem: Coping: Goal: Level of anxiety will decrease Outcome: Progressing   Problem: Elimination: Goal: Will not experience complications related to bowel motility Outcome: Progressing Goal: Will not experience complications related to urinary retention Outcome: Progressing   Problem: Pain Managment: Goal: General experience of comfort will improve Outcome: Progressing   Problem: Safety: Goal: Ability to remain free from injury will improve Outcome: Progressing   Problem: Skin Integrity: Goal: Risk for impaired skin integrity will decrease Outcome: Progressing   

## 2022-09-20 NOTE — Progress Notes (Signed)
ANTICOAGULATION CONSULT NOTE  Pharmacy Consult for heparin Indication: atrial fibrillation Brief A/P: Heparin level within goal range  Continue Heparin at current rate   No Known Allergies  Patient Measurements: Height: 6\' 9"  (205.7 cm) Weight: 91.6 kg (202 lb) IBW/kg (Calculated) : 98.3 Heparin Dosing Weight: 91.6 kg  Vital Signs: Temp: 98 F (36.7 C) (06/18 2024) Temp Source: Oral (06/18 2024) BP: 99/75 (06/18 2024) Pulse Rate: 98 (06/18 2024)  Labs: Recent Labs    09/19/22 1537 09/19/22 1548 09/19/22 1624 09/20/22 0040  HGB  --  13.3  --  13.3  HCT  --  39.9  --  40.0  PLT  --  105*  --  103*  APTT  --   --   --  85*  HEPARINUNFRC  --   --   --  0.38  CREATININE 1.23  --   --  1.17  TROPONINIHS  --  15 12  --      Estimated Creatinine Clearance: 81.6 mL/min (by C-G formula based on SCr of 1.17 mg/dL).   Assessment: 67 y.o. male with h/o Afib, Eliquis on hold, for heparin  Goal of Therapy:  Heparin level 0.3-0.7 units/mL Monitor platelets by anticoagulation protocol: Yes   Plan:  Continue Heparin at current rate   Geannie Risen, PharmD, BCPS  09/20/2022 2:09 AM

## 2022-09-20 NOTE — Progress Notes (Signed)
PROGRESS NOTE    Garrett Carroll  ZOX:[id number] DOB: Jan 13, 1956 DOA: 09/19/2022 PCP: Mirna Mires, MD  67/M with recently diagnosed cardiomyopathy, systolic CHF, A-fib presented to the ED with shortness of breath and lower extremity edema X 1 week. -Recent admission in March with A-fib RVR, declined cardioversion and LHC then -In the ED hypotensive, A-fib heart rate in the 1 teens, lactate 1.5, troponin 15, albumin 3.3   Subjective: -Still short of breath and weak  Assessment and Plan:  A-fib with RVR -Continue IV amiodarone, IV heparin -Anticipate need for cardioversion when volume status is improved   Acute on chronic systolic CHF, BiV failure Severe mitral regurgitation, severe TR Recent diagnosis -Echo 5/24 with EF 20-25%, severely reduced RV severe mitral regurgitation -Continue IV Lasix, Farxiga -GDMT limited by hypotension -Cards following, plan for rightand left heart cath tomorrow   Positive D-dimer  -CTA negative for PE   Thrombocytopenia: Acute on chronic on last admission as low as 131, currently 105, monitor -May have chronic liver disease from right heart failure   Ascending aortic aneurysm -Needs follow-up   Protein-calorie malnutrition, severe- RD consulted Body mass index is 21.65 kg/m.   Abnormal TSH -Was normal 3 months ago, 16.7 today -Recommend repeat labs in 2 months ED    DVT prophylaxis: Code Status:  Family Communication: Disposition Plan:   Consultants:    Procedures:   Antimicrobials:    Objective: Vitals:   09/19/22 1834 09/19/22 2024 09/20/22 0410 09/20/22 0742  BP: 122/84 99/75 101/74 (!) 89/69  Pulse: (!) 119 98 (!) 105 92  Resp: 16 19 17 17   Temp:  98 F (36.7 C) 97.7 F (36.5 C) (!) 97.5 F (36.4 C)  TempSrc:  Oral Oral Oral  SpO2: 98% 94%    Weight:   89.8 kg   Height:        Intake/Output Summary (Last 24 hours) at 09/20/2022 1420 Last data filed at 09/20/2022 1400 Gross per 24 hour  Intake 438.05 ml   Output 1600 ml  Net -1161.95 ml   Filed Weights   09/19/22 1524 09/20/22 0410  Weight: 91.6 kg 89.8 kg    Examination:  General exam: Chronically ill male laying in bed, AAOx3 HEENT: Positive JVD CVS: S1-S2, regular irregular rhythm, systolic murmur Lungs: Decreased breath sounds at the bases Abdomen: Soft, nontender, bowel sounds present Extremities: 1+ edema  Skin: No rashes Psychiatry:  Mood & affect appropriate.     Data Reviewed:   CBC: Recent Labs  Lab 09/19/22 1548 09/20/22 0040  WBC 6.1 5.9  NEUTROABS 4.3  --   HGB 13.3 13.3  HCT 39.9 40.0  MCV 94.1 92.4  PLT 105* 103*   Basic Metabolic Panel: Recent Labs  Lab 09/19/22 1537 09/20/22 0040  NA 141 138  K 3.6 3.6  CL 107 108  CO2 19* 19*  GLUCOSE 131* 113*  BUN 10 11  CREATININE 1.23 1.17  CALCIUM 8.8* 8.4*   GFR: Estimated Creatinine Clearance: 80 mL/min (by C-G formula based on SCr of 1.17 mg/dL). Liver Function Tests: Recent Labs  Lab 09/19/22 1548 09/20/22 0040  AST 25 21  ALT 34 30  ALKPHOS 57 52  BILITOT 3.5* 2.7*  PROT 5.9* 5.6*  ALBUMIN 3.3* 3.1*   No results for input(s): "LIPASE", "AMYLASE" in the last 168 hours. No results for input(s): "AMMONIA" in the last 168 hours. Coagulation Profile: No results for input(s): "INR", "PROTIME" in the last 168 hours. Cardiac Enzymes: No results for input(s): "CKTOTAL", "CKMB", "  CKMBINDEX", "TROPONINI" in the last 168 hours. BNP (last 3 results) Recent Labs    06/26/22 1133  PROBNP 2,812*   HbA1C: No results for input(s): "HGBA1C" in the last 72 hours. CBG: No results for input(s): "GLUCAP" in the last 168 hours. Lipid Profile: No results for input(s): "CHOL", "HDL", "LDLCALC", "TRIG", "CHOLHDL", "LDLDIRECT" in the last 72 hours. Thyroid Function Tests: Recent Labs    09/20/22 0040  TSH 16.790*   Anemia Panel: No results for input(s): "VITAMINB12", "FOLATE", "FERRITIN", "TIBC", "IRON", "RETICCTPCT" in the last 72 hours. Urine  analysis: No results found for: "COLORURINE", "APPEARANCEUR", "LABSPEC", "PHURINE", "GLUCOSEU", "HGBUR", "BILIRUBINUR", "KETONESUR", "PROTEINUR", "UROBILINOGEN", "NITRITE", "LEUKOCYTESUR" Sepsis Labs: @LABRCNTIP (procalcitonin:4,lacticidven:4)  ) Recent Results (from the past 240 hour(s))  Blood culture (routine x 2)     Status: None (Preliminary result)   Collection Time: 09/19/22  4:10 PM   Specimen: BLOOD  Result Value Ref Range Status   Specimen Description BLOOD SITE NOT SPECIFIED  Final   Special Requests   Final    BOTTLES DRAWN AEROBIC AND ANAEROBIC Blood Culture results may not be optimal due to an inadequate volume of blood received in culture bottles   Culture   Final    NO GROWTH < 24 HOURS Performed at [facility], [address], [place], [place] [address]    Report Status PENDING  Incomplete  Blood culture (routine x 2)     Status: None (Preliminary result)   Collection Time: 09/19/22  4:24 PM   Specimen: BLOOD  Result Value Ref Range Status   Specimen Description BLOOD SITE NOT SPECIFIED  Final   Special Requests   Final    BOTTLES DRAWN AEROBIC AND ANAEROBIC Blood Culture results may not be optimal due to an inadequate volume of blood received in culture bottles   Culture   Final    NO GROWTH < 24 HOURS Performed at [facility], [address], [place], [place] [address]    Report Status PENDING  Incomplete     Radiology Studies: CT Angio Chest PE W and/or Wo Contrast  Result Date: 09/19/2022 CLINICAL DATA:  Shortness of breath and lower extremity swelling for 1 week. Dizziness starting today. EXAM: CT ANGIOGRAPHY CHEST WITH CONTRAST TECHNIQUE: Multidetector CT imaging of the chest was performed using the standard protocol during bolus administration of intravenous contrast. Multiplanar CT image reconstructions and MIPs were obtained to evaluate the vascular anatomy. RADIATION DOSE REDUCTION: This exam was performed according to the departmental  dose-optimization program which includes automated exposure control, adjustment of the mA and/or kV according to patient size and/or use of iterative reconstruction technique. CONTRAST:  75mL OMNIPAQUE IOHEXOL 350 MG/ML SOLN COMPARISON:  09/19/2022 chest radiograph FINDINGS: Cardiovascular: No filling defect is identified in the pulmonary arterial tree to suggest pulmonary embolus. Ascending thoracic aortic aneurysm 4.1 cm in diameter. Prominent main pulmonary artery favoring pulmonary arterial hypertension. Moderate cardiomegaly. There is no significant systemic arterial contrast opacification on today's exam which was timed for pulmonary arterial opacification. Mediastinum/Nodes: Hazy density in the mediastinum and subcutaneous tissues favoring mild third spacing of fluid. Lungs/Pleura: Small left and small to moderate right pleural effusions. The oval-shaped density seen on recent chest radiography is immediately along the minor fissure and is of low-density, favoring a small amount of fluid in the minor fissure. There is bandlike atelectasis in the right middle lobe and passive atelectasis associated with both pleural effusions. Mild subsegmental atelectasis in the lingula. There is some hazy dependent density in  both lungs probably from low-grade edema. Upper Abdomen: Suspected mild perihepatic ascites. Suspected mild fluid infiltration along upper abdominal adipose tissues. Musculoskeletal: Thoracic spondylosis. Review of the MIP images confirms the above findings. IMPRESSION: 1. No filling defect is identified in the pulmonary arterial tree to suggest pulmonary embolus. 2. Small left and small to moderate right pleural effusions with passive atelectasis associated with both pleural effusions. The oval-shaped density seen on recent chest radiography is immediately along the minor fissure and is of low-density, favoring a small amount of fluid in the minor fissure. 3. Moderate cardiomegaly. 4. Prominent main  pulmonary artery favoring pulmonary arterial hypertension. 5. Suspected third spacing of fluid with hazy density in mediastinal, subcutaneous, and upper abdominal adipose tissues and suspected mild perihepatic ascites. 6. Ascending thoracic aortic aneurysm 4.1 cm in diameter. Recommend annual imaging followup by CTA or MRA. This recommendation follows 2010 ACCF/AHA/AATS/ACR/ASA/SCA/SCAI/SIR/STS/SVM Guidelines for the Diagnosis and Management of Patients with Thoracic Aortic Disease. Circulation. 2010; 121: Z610-R604. Aortic aneurysm NOS (ICD10-I71.9) 7. Thoracic spondylosis. Electronically Signed   By: Gaylyn Rong M.D.   On: 09/19/2022 18:43   VAS [place] LOWER EXTREMITY VENOUS (DVT)  Result Date: 09/19/2022  Lower Venous DVT Study Patient Name:  REGNALD SZCZEPANSKI  Date of Exam:   09/19/2022 Medical Rec #: [id number]       Accession #:    [id number] Date of Birth: 07-17-1955       Patient Gender: M Patient Age:   21 years Exam Location:  [facility] Procedure:      VAS [place] LOWER EXTREMITY VENOUS (DVT) Referring Phys: ADAM CURATOLO --------------------------------------------------------------------------------  Indications: Edema, and SOB.  Comparison Study: No previous study. Performing Technologist: McKayla Maag RVT, [place]  Examination Guidelines: A complete evaluation includes B-mode imaging, spectral Doppler, color Doppler, and power Doppler as needed of all accessible portions of each vessel. Bilateral testing is considered an integral part of a complete examination. Limited examinations for reoccurring indications may be performed as noted. The reflux portion of the exam is performed with the patient in reverse Trendelenburg.  +---------+---------------+---------+-----------+----------+--------------+ RIGHT    CompressibilityPhasicitySpontaneityPropertiesThrombus Aging +---------+---------------+---------+-----------+----------+--------------+ CFV      Full           Yes      Yes                                  +---------+---------------+---------+-----------+----------+--------------+ SFJ      Full                                                        +---------+---------------+---------+-----------+----------+--------------+ FV Prox  Full                                                        +---------+---------------+---------+-----------+----------+--------------+ FV Mid   Full                                                        +---------+---------------+---------+-----------+----------+--------------+  FV DistalFull                                                        +---------+---------------+---------+-----------+----------+--------------+ PFV      Full                                                        +---------+---------------+---------+-----------+----------+--------------+ POP      Full           Yes      Yes                                 +---------+---------------+---------+-----------+----------+--------------+ PTV      Full                                                        +---------+---------------+---------+-----------+----------+--------------+ PERO     Full                                                        +---------+---------------+---------+-----------+----------+--------------+   +----+---------------+---------+-----------+----------+--------------+ LEFTCompressibilityPhasicitySpontaneityPropertiesThrombus Aging +----+---------------+---------+-----------+----------+--------------+ CFV Full           Yes      Yes                                 +----+---------------+---------+-----------+----------+--------------+ SFJ Full                                                        +----+---------------+---------+-----------+----------+--------------+     Summary: RIGHT: - There is no evidence of deep vein thrombosis in the lower extremity.  - No cystic structure found in the popliteal  fossa.  LEFT: - No evidence of common femoral vein obstruction.  *See table(s) above for measurements and observations. Electronically signed by Waverly Ferrari MD on 09/19/2022 at 6:19:35 PM.    Final    DG Chest 2 View  Result Date: 09/19/2022 CLINICAL DATA:  Shortness of breath. EXAM: CHEST - 2 VIEW COMPARISON:  June 11, 2022. FINDINGS: Stable cardiomegaly. Mild bibasilar subsegmental atelectasis or edema is noted with small bilateral pleural effusions. Stable ovoid shaped nodular density seen in right midlung. Bony thorax is unremarkable. IMPRESSION: Cardiomegaly. Mild bibasilar subsegmental atelectasis or edema is noted with small pleural effusions. Grossly stable ovoid nodular density seen in right midlung; follow-up CT scan or chest radiograph in 3 months is recommended to ensure stability. Electronically Signed   By: Lupita Raider M.D.   On: 09/19/2022 16:30     Scheduled Meds:  amiodarone  200  mg Oral BID   dapagliflozin propanediol  10 mg Oral Daily   furosemide  40 mg Intravenous BID   Continuous Infusions:  amiodarone 30 mg/hr (09/20/22 0600)   heparin 1,250 Units/hr (09/20/22 0600)     LOS: 1 day    Time spent:    Zannie Cove, MD [facility]   09/20/2022, 2:20 PM

## 2022-09-20 NOTE — Progress Notes (Signed)
Heart Failure Navigator Progress Note  Assessed for Heart & Vascular TOC clinic readiness.  Patient does not meet criteria due to [facility] cardiology patient.   Navigator will sign off at this time.    Breea Loncar, BSN, RN Heart Failure Nurse Navigator Secure Chat Only   

## 2022-09-20 NOTE — Progress Notes (Signed)
Progress Note  Patient Name: Garrett Carroll MRN: [id number] DOB: 05/16/1955 Date of Encounter: 09/20/2022  Attending physician: Joseph, Preetha, MD Primary care provider: Hill, Gerald, MD Primary Cardiologist: Akeela Busk, DO, FACC  Subjective: Garrett Carroll is a 67 y.o. African-American male who was seen and examined at bedside  Denies chest pain. Shortness of breath is improving. I's and O's likely not accurate, documented urine output a 50 cc and documented weight loss of 4 pounds.  Objective: Vital Signs in the last 24 hours: Temp:  [97.5 F (36.4 C)-98.1 F (36.7 C)] 97.5 F (36.4 C) (06/19 0742) Pulse Rate:  [72-119] 92 (06/19 0742) Resp:  [16-24] 17 (06/19 0742) BP: (79-125)/(62-109) 89/69 (06/19 0742) SpO2:  [94 %-98 %] 94 % (06/18 2024) Weight:  [89.8 kg-91.6 kg] 89.8 kg (06/19 0410)  Intake/Output:  Intake/Output Summary (Last 24 hours) at 09/20/2022 1046 Last data filed at 09/20/2022 0600 Gross per 24 hour  Intake 438.05 ml  Output 850 ml  Net -411.95 ml    Net IO Since Admission: -411.95 mL [09/20/22 1046]  Weights:     09/20/2022    4:10 AM 09/19/2022    3:24 PM 09/19/2022   11:48 AM  Last 3 Weights  Weight (lbs) 198 lb 202 lb 202 lb  Weight (kg) 89.812 kg 91.627 kg 91.627 kg      Telemetry:  Overnight telemetry shows controlled A-fib with rare PVCs, which I personally reviewed.   Physical examination: PHYSICAL EXAM: Vitals:   09/19/22 1834 09/19/22 2024 09/20/22 0410 09/20/22 0742  BP: 122/84 99/75 101/74 (!) 89/69  Pulse: (!) 119 98 (!) 105 92  Resp: 16 19 17 17  Temp:  98 F (36.7 C) 97.7 F (36.5 C) (!) 97.5 F (36.4 C)  TempSrc:  Oral Oral Oral  SpO2: 98% 94%    Weight:   89.8 kg   Height:        Physical Exam  Constitutional: No distress.  Appears older than stated age, no acute distress, hemodynamically stable.  Neck: JVD present.  Cardiovascular: Normal rate, S1 normal, S2 normal, intact distal pulses and normal pulses. An  irregularly irregular rhythm present. Exam reveals no gallop, no S3 and no S4.  Murmur heard. Holosystolic murmur is present with a grade of 3/6 at the apex. Pulmonary/Chest: No stridor. He has no wheezes. He has no rales.  Decreased breath sounds bilaterally.  Abdominal: Soft. Bowel sounds are normal. He exhibits no distension. There is no abdominal tenderness.  Musculoskeletal:        General: Edema (Bilateral +1) present.     Cervical back: Neck supple.     Comments: Warm to touch bilaterally.  Neurological: He is alert and oriented to person, place, and time. He has intact cranial nerves (2-12).  Skin: Skin is warm and moist.   Lab Results: Chemistry Recent Labs  Lab 09/19/22 1537 09/19/22 1548 09/20/22 0040  NA 141  --  138  K 3.6  --  3.6  CL 107  --  108  CO2 19*  --  19*  GLUCOSE 131*  --  113*  BUN 10  --  11  CREATININE 1.23  --  1.17  CALCIUM 8.8*  --  8.4*  PROT  --  5.9* 5.6*  ALBUMIN  --  3.3* 3.1*  AST  --  25 21  ALT  --  34 30  ALKPHOS  --  57 52  BILITOT  --  3.5* 2.7*  GFRNONAA >60  --  >  60  ANIONGAP 15  --  11    Hematology Recent Labs  Lab 09/19/22 1548 09/20/22 0040  WBC 6.1 5.9  RBC 4.24 4.33  HGB 13.3 13.3  HCT 39.9 40.0  MCV 94.1 92.4  MCH 31.4 30.7  MCHC 33.3 33.3  RDW 15.7* 15.7*  PLT 105* 103*   High Sensitivity Troponin:   Recent Labs  Lab 09/19/22 1548 09/19/22 1624  TROPONINIHS 15 12     Cardiac EnzymesNo results for input(s): "TROPONINI" in the last 168 hours. No results for input(s): "TROPIPOC" in the last 168 hours.  BNP Recent Labs  Lab 09/19/22 1545  BNP 1,138.8*    DDimer  Recent Labs  Lab 09/19/22 1548  DDIMER 1.46*    Hemoglobin A1c:  Lab Results  Component Value Date   HGBA1C 5.8 (H) 06/12/2022   MPG 120 06/12/2022   TSH  Recent Labs    06/11/22 1524 09/20/22 0040  TSH 2.376 16.790*   Lipid Panel  Lab Results  Component Value Date   CHOL 121 06/12/2022   HDL 54 06/12/2022   LDLCALC 61  06/12/2022   LDLDIRECT 62 06/12/2022   TRIG 32 06/12/2022   CHOLHDL 2.2 06/12/2022   Drugs of Abuse     Component Value Date/Time   LABOPIA NONE DETECTED 09/19/2022 2019   COCAINSCRNUR NONE DETECTED 09/19/2022 2019   LABBENZ NONE DETECTED 09/19/2022 2019   AMPHETMU NONE DETECTED 09/19/2022 2019   THCU NONE DETECTED 09/19/2022 2019   LABBARB NONE DETECTED 09/19/2022 2019      Imaging: CT Angio Chest PE W and/or Wo Contrast  Result Date: 09/19/2022 CLINICAL DATA:  Shortness of breath and lower extremity swelling for 1 week. Dizziness starting today. EXAM: CT ANGIOGRAPHY CHEST WITH CONTRAST TECHNIQUE: Multidetector CT imaging of the chest was performed using the standard protocol during bolus administration of intravenous contrast. Multiplanar CT image reconstructions and MIPs were obtained to evaluate the vascular anatomy. RADIATION DOSE REDUCTION: This exam was performed according to the departmental dose-optimization program which includes automated exposure control, adjustment of the mA and/or kV according to patient size and/or use of iterative reconstruction technique. CONTRAST:  75mL OMNIPAQUE IOHEXOL 350 MG/ML SOLN COMPARISON:  09/19/2022 chest radiograph FINDINGS: Cardiovascular: No filling defect is identified in the pulmonary arterial tree to suggest pulmonary embolus. Ascending thoracic aortic aneurysm 4.1 cm in diameter. Prominent main pulmonary artery favoring pulmonary arterial hypertension. Moderate cardiomegaly. There is no significant systemic arterial contrast opacification on today's exam which was timed for pulmonary arterial opacification. Mediastinum/Nodes: Hazy density in the mediastinum and subcutaneous tissues favoring mild third spacing of fluid. Lungs/Pleura: Small left and small to moderate right pleural effusions. The oval-shaped density seen on recent chest radiography is immediately along the minor fissure and is of low-density, favoring a small amount of fluid in the  minor fissure. There is bandlike atelectasis in the right middle lobe and passive atelectasis associated with both pleural effusions. Mild subsegmental atelectasis in the lingula. There is some hazy dependent density in both lungs probably from low-grade edema. Upper Abdomen: Suspected mild perihepatic ascites. Suspected mild fluid infiltration along upper abdominal adipose tissues. Musculoskeletal: Thoracic spondylosis. Review of the MIP images confirms the above findings. IMPRESSION: 1. No filling defect is identified in the pulmonary arterial tree to suggest pulmonary embolus. 2. Small left and small to moderate right pleural effusions with passive atelectasis associated with both pleural effusions. The oval-shaped density seen on recent chest radiography is immediately along the minor fissure and is of   low-density, favoring a small amount of fluid in the minor fissure. 3. Moderate cardiomegaly. 4. Prominent main pulmonary artery favoring pulmonary arterial hypertension. 5. Suspected third spacing of fluid with hazy density in mediastinal, subcutaneous, and upper abdominal adipose tissues and suspected mild perihepatic ascites. 6. Ascending thoracic aortic aneurysm 4.1 cm in diameter. Recommend annual imaging followup by CTA or MRA. This recommendation follows 2010 ACCF/AHA/AATS/ACR/ASA/SCA/SCAI/SIR/STS/SVM Guidelines for the Diagnosis and Management of Patients with Thoracic Aortic Disease. Circulation. 2010; 121: E266-e369. Aortic aneurysm NOS (ICD10-I71.9) 7. Thoracic spondylosis. Electronically Signed   By: Walter  Liebkemann M.D.   On: 09/19/2022 18:43   VAS US LOWER EXTREMITY VENOUS (DVT)  Result Date: 09/19/2022  Lower Venous DVT Study Patient Name:  Jedediah Duran  Date of Exam:   09/19/2022 Medical Rec #: [id number]       Accession #:    [id number] Date of Birth: 01/23/1956       Patient Gender: M Patient Age:   66 years Exam Location:  [facility] Procedure:      VAS US LOWER EXTREMITY VENOUS  (DVT) Referring Phys: ADAM CURATOLO --------------------------------------------------------------------------------  Indications: Edema, and SOB.  Comparison Study: No previous study. Performing Technologist: McKayla Maag RVT, VT  Examination Guidelines: A complete evaluation includes B-mode imaging, spectral Doppler, color Doppler, and power Doppler as needed of all accessible portions of each vessel. Bilateral testing is considered an integral part of a complete examination. Limited examinations for reoccurring indications may be performed as noted. The reflux portion of the exam is performed with the patient in reverse Trendelenburg.  +---------+---------------+---------+-----------+----------+--------------+ RIGHT    CompressibilityPhasicitySpontaneityPropertiesThrombus Aging +---------+---------------+---------+-----------+----------+--------------+ CFV      Full           Yes      Yes                                 +---------+---------------+---------+-----------+----------+--------------+ SFJ      Full                                                        +---------+---------------+---------+-----------+----------+--------------+ FV Prox  Full                                                        +---------+---------------+---------+-----------+----------+--------------+ FV Mid   Full                                                        +---------+---------------+---------+-----------+----------+--------------+ FV DistalFull                                                        +---------+---------------+---------+-----------+----------+--------------+ PFV      Full                                                        +---------+---------------+---------+-----------+----------+--------------+   POP      Full           Yes      Yes                                 +---------+---------------+---------+-----------+----------+--------------+ PTV       Full                                                        +---------+---------------+---------+-----------+----------+--------------+ PERO     Full                                                        +---------+---------------+---------+-----------+----------+--------------+   +----+---------------+---------+-----------+----------+--------------+ LEFTCompressibilityPhasicitySpontaneityPropertiesThrombus Aging +----+---------------+---------+-----------+----------+--------------+ CFV Full           Yes      Yes                                 +----+---------------+---------+-----------+----------+--------------+ SFJ Full                                                        +----+---------------+---------+-----------+----------+--------------+     Summary: RIGHT: - There is no evidence of deep vein thrombosis in the lower extremity.  - No cystic structure found in the popliteal fossa.  LEFT: - No evidence of common femoral vein obstruction.  *See table(s) above for measurements and observations. Electronically signed by Christopher Dickson MD on 09/19/2022 at 6:19:35 PM.    Final    DG Chest 2 View  Result Date: 09/19/2022 CLINICAL DATA:  Shortness of breath. EXAM: CHEST - 2 VIEW COMPARISON:  June 11, 2022. FINDINGS: Stable cardiomegaly. Mild bibasilar subsegmental atelectasis or edema is noted with small bilateral pleural effusions. Stable ovoid shaped nodular density seen in right midlung. Bony thorax is unremarkable. IMPRESSION: Cardiomegaly. Mild bibasilar subsegmental atelectasis or edema is noted with small pleural effusions. Grossly stable ovoid nodular density seen in right midlung; follow-up CT scan or chest radiograph in 3 months is recommended to ensure stability. Electronically Signed   By: James  Green Jr M.D.   On: 09/19/2022 16:30    CARDIAC DATABASE: EKG: 06/19/2022: Atrial fibrillation, 100 bpm, LVH with voltage, PVCs, prolonged QT.     Echocardiogram: 06/12/2022:   1. Left ventricular ejection fraction, by estimation, is 20 to 25%. The left ventricle has severely decreased function. The left ventricle demonstrates global hypokinesis. The left ventricular internal cavity size was moderately dilated. Left  ventricular diastolic function could not be evaluated.  2. Right ventricular systolic function is severely reduced. The right ventricular size is severely enlarged.  3. Left atrial size was severely dilated.  4. Right atrial size was severely dilated.  5. The mitral valve is normal in structure. Severe mitral valve regurgitation. No evidence of mitral stenosis.  6. Tricuspid valve regurgitation is severe.  7. The aortic valve is normal   in structure. Aortic valve regurgitation is not visualized. No aortic stenosis is present.  8. Aortic dilatation noted. There is borderline dilatation of the aortic root, measuring 39 mm. There is borderline dilatation of the ascending aorta, measuring 38 mm.  9. The inferior vena cava is dilated in size with <50% respiratory variability, suggesting right atrial pressure of 15 mmHg.  Scheduled Meds:  amiodarone  200 mg Oral BID   dapagliflozin propanediol  10 mg Oral Daily   furosemide  40 mg Intravenous BID    Continuous Infusions:  amiodarone 30 mg/hr (09/20/22 0600)   heparin 1,250 Units/hr (09/20/22 0600)    PRN Meds: acetaminophen **OR** acetaminophen, ondansetron **OR** ondansetron (ZOFRAN) IV, mouth rinse, senna-docusate   IMPRESSION & RECOMMENDATIONS: Tremel Nemmers is a 66 y.o. African-American male whose past medical history and cardiac risk factors include: Cardiomyopathy, HFrEF, atrial fibrillation, lung nodule, noncompliance.  Impression: Acute biventricular heart failure, stage C, NYHA class III. Persistent atrial fibrillation with controlled ventricular rate Cardiomyopathy, suspect nonischemic Long-term oral anticoagulation. Long-term antiarrhythmic  medications. Ascending thoracic aortic aneurysm of 4.1 cm (June 2024) Medication noncompliance. Hypothyroidism  Plan: Acute biventricular heart failure, stage C, NYHA class III Hospitalized due to 20 pounds of weight gain compared to last office visit and symptomatic CHF symptoms. Focusing on diuresis. Suspect I's and O's are not charted accurately when compared to his daily weights. Strict I's and O's reemphasized to nursing staff and patient. Increase Lasix to 40 mg p.o. twice daily. Continue Farxiga Given his soft blood pressures we will hold off on GDMT and focus on diuresis. Limited echocardiogram scheduled for later today to rule out LV thrombus. Recommended left and right heart catheterization to evaluate for obstructive CAD and hemodynamics respectively.  Risks, benefits, alternatives, and limitations discussed with both patient and his wife over the phone.  They both are in agreement with proceeding with left and right heart catheterization questions and concerns addressed to their satisfaction.  The procedure of left and right heart catheterization with possible intervention was explained to the patient and wife (over the phone) in detail.  The indication, alternatives, risks and benefits were reviewed.  Complications include but not limited to bleeding, infection, vascular injury, stroke, myocardial infarction, arrhythmia (requiring medical or cardiopulmonary resuscitation), kidney injury (requiring short-term or long-term hemodialysis), radiation-related injury in the case of prolonged fluoroscopy use, emergent cardiac surgery, temporary or permanent pacemaker, and death. The patient and wife understands the risks of serious complication is 1-2 in 1000 with diagnostic cardiac cath and 1-2% or less with angioplasty/stenting. The patient and wife voices understanding and provides verbal feedback their questions and concerns are addressed to their satisfaction and patient wishes to  proceed with coronary angiography with possible PCI.  Persistent atrial fibrillation with controlled ventricular rate: Ventricular rate improving. Continue IV amiodarone for total of 24 hours and will transition to p.o. Currently on IV heparin drip will transition to DOAC after the heart catheterization tomorrow. Patient has been noncompliant with anticoagulation in the past. He would benefit from rhythm control given his biventricular heart failure. Reemphasized the importance of medication compliance if we were to proceed with transesophageal guided cardioversion.  Risks, benefits, alternatives of this discussed.  Patient and wife would like to discuss this further for now I will request a procedure spot for Friday, September 21, 2022.  Ascending thoracic aortic aneurysm: 41 mm as of June 2024. Yearly follow-up recommended by radiology.  Elevated D-dimers: DVT study negative for deep venous thrombosis CT PE study negative for   pulmonary embolism  Hypothyroidism: Will defer management to primary team.  Noncompliance: Despite his comorbidities he has followed up outpatient once since his last discharge in March 2024.  He is also been noncompliant with medical therapy.  Patient's questions and concerns were addressed to his satisfaction. He voices understanding of the instructions provided during this encounter.   This note was created using a voice recognition software as a result there may be grammatical errors inadvertently enclosed that do not reflect the nature of this encounter. Every attempt is made to correct such errors.  Laurice Kimmons, DO, FACC  Pager:  [phone] Office: [phone] 09/20/2022, 10:46 AM      

## 2022-09-21 ENCOUNTER — Encounter (HOSPITAL_COMMUNITY)
Admission: EM | Disposition: A | Discharge status: Home / Self Care | Payer: Self-pay | Source: Home / Self Care | Attending: Internal Medicine

## 2022-09-21 ENCOUNTER — Other Ambulatory Visit (HOSPITAL_COMMUNITY): Payer: Self-pay

## 2022-09-21 DIAGNOSIS — I5020 Unspecified systolic (congestive) heart failure: Secondary | ICD-10-CM

## 2022-09-21 DIAGNOSIS — I5023 Acute on chronic systolic (congestive) heart failure: Secondary | ICD-10-CM | POA: Diagnosis not present

## 2022-09-21 HISTORY — PX: RIGHT/LEFT HEART CATH AND CORONARY ANGIOGRAPHY: CATH118266

## 2022-09-21 LAB — POCT I-STAT EG7
Acid-Base Excess: 0 mmol/L (ref 0.0–2.0)
Acid-Base Excess: 0 mmol/L (ref 0.0–2.0)
Acid-Base Excess: 2 mmol/L (ref 0.0–2.0)
Bicarbonate: 24.1 mmol/L (ref 20.0–28.0)
Bicarbonate: 24.3 mmol/L (ref 20.0–28.0)
Bicarbonate: 25.7 mmol/L (ref 20.0–28.0)
Calcium, Ion: 1.14 mmol/L — ABNORMAL LOW (ref 1.15–1.40)
Calcium, Ion: 1.15 mmol/L (ref 1.15–1.40)
Calcium, Ion: 1.18 mmol/L (ref 1.15–1.40)
HCT: 37 % — ABNORMAL LOW (ref 39.0–52.0)
HCT: 37 % — ABNORMAL LOW (ref 39.0–52.0)
HCT: 37 % — ABNORMAL LOW (ref 39.0–52.0)
Hemoglobin: 12.6 g/dL — ABNORMAL LOW (ref 13.0–17.0)
Hemoglobin: 12.6 g/dL — ABNORMAL LOW (ref 13.0–17.0)
Hemoglobin: 12.6 g/dL — ABNORMAL LOW (ref 13.0–17.0)
O2 Saturation: 68 %
O2 Saturation: 68 %
O2 Saturation: 75 %
Potassium: 3.6 mmol/L (ref 3.5–5.1)
Potassium: 3.6 mmol/L (ref 3.5–5.1)
Potassium: 3.7 mmol/L (ref 3.5–5.1)
Sodium: 141 mmol/L (ref 135–145)
Sodium: 141 mmol/L (ref 135–145)
Sodium: 141 mmol/L (ref 135–145)
TCO2: 25 mmol/L (ref 22–32)
TCO2: 25 mmol/L (ref 22–32)
TCO2: 27 mmol/L (ref 22–32)
pCO2, Ven: 36.2 mmHg — ABNORMAL LOW (ref 44–60)
pCO2, Ven: 36.7 mmHg — ABNORMAL LOW (ref 44–60)
pCO2, Ven: 36.9 mmHg — ABNORMAL LOW (ref 44–60)
pH, Ven: 7.426 (ref 7.25–7.43)
pH, Ven: 7.435 — ABNORMAL HIGH (ref 7.25–7.43)
pH, Ven: 7.451 — ABNORMAL HIGH (ref 7.25–7.43)
pO2, Ven: 33 mmHg (ref 32–45)
pO2, Ven: 34 mmHg (ref 32–45)
pO2, Ven: 38 mmHg (ref 32–45)

## 2022-09-21 LAB — CBC
HCT: 38.3 % — ABNORMAL LOW (ref 39.0–52.0)
Hemoglobin: 12.6 g/dL — ABNORMAL LOW (ref 13.0–17.0)
MCH: 30.1 pg (ref 26.0–34.0)
MCHC: 32.9 g/dL (ref 30.0–36.0)
MCV: 91.6 fL (ref 80.0–100.0)
Platelets: 100 10*3/uL — ABNORMAL LOW (ref 150–400)
RBC: 4.18 MIL/uL — ABNORMAL LOW (ref 4.22–5.81)
RDW: 15.6 % — ABNORMAL HIGH (ref 11.5–15.5)
WBC: 5.2 10*3/uL (ref 4.0–10.5)
nRBC: 0 % (ref 0.0–0.2)

## 2022-09-21 LAB — POCT I-STAT 7, (LYTES, BLD GAS, ICA,H+H)
Acid-base deficit: 1 mmol/L (ref 0.0–2.0)
Bicarbonate: 21.5 mmol/L (ref 20.0–28.0)
Calcium, Ion: 1.12 mmol/L — ABNORMAL LOW (ref 1.15–1.40)
HCT: 36 % — ABNORMAL LOW (ref 39.0–52.0)
Hemoglobin: 12.2 g/dL — ABNORMAL LOW (ref 13.0–17.0)
O2 Saturation: 95 %
Potassium: 3.5 mmol/L (ref 3.5–5.1)
Sodium: 141 mmol/L (ref 135–145)
TCO2: 22 mmol/L (ref 22–32)
pCO2 arterial: 28.4 mmHg — ABNORMAL LOW (ref 32–48)
pH, Arterial: 7.487 — ABNORMAL HIGH (ref 7.35–7.45)
pO2, Arterial: 70 mmHg — ABNORMAL LOW (ref 83–108)

## 2022-09-21 LAB — BASIC METABOLIC PANEL
Anion gap: 10 (ref 5–15)
BUN: 12 mg/dL (ref 8–23)
CO2: 24 mmol/L (ref 22–32)
Calcium: 8.6 mg/dL — ABNORMAL LOW (ref 8.9–10.3)
Chloride: 106 mmol/L (ref 98–111)
Creatinine, Ser: 1.37 mg/dL — ABNORMAL HIGH (ref 0.61–1.24)
GFR, Estimated: 57 mL/min — ABNORMAL LOW (ref >60)
Glucose, Bld: 94 mg/dL (ref 70–99)
Potassium: 4 mmol/L (ref 3.5–5.1)
Sodium: 140 mmol/L (ref 135–145)

## 2022-09-21 LAB — HEPARIN LEVEL (UNFRACTIONATED): Heparin Unfractionated: 0.32 IU/mL (ref 0.30–0.70)

## 2022-09-21 SURGERY — RIGHT/LEFT HEART CATH AND CORONARY ANGIOGRAPHY
Anesthesia: LOCAL

## 2022-09-21 MED ORDER — MIDAZOLAM HCL 2 MG/2ML IJ SOLN
INTRAMUSCULAR | Status: DC | PRN
  Administered 2022-09-21: 1 mg via INTRAVENOUS

## 2022-09-21 MED ORDER — HEPARIN SODIUM (PORCINE) 1000 UNIT/ML IJ SOLN
INTRAMUSCULAR | Status: AC
  Filled 2022-09-21: qty 10

## 2022-09-21 MED ORDER — SODIUM CHLORIDE 0.9 % IV SOLN
INTRAVENOUS | Status: AC | PRN
  Administered 2022-09-21: 500 mL via INTRAVENOUS

## 2022-09-21 MED ORDER — HEPARIN SODIUM (PORCINE) 1000 UNIT/ML IJ SOLN
INTRAMUSCULAR | Status: DC | PRN
  Administered 2022-09-21: 4000 [IU] via INTRAVENOUS

## 2022-09-21 MED ORDER — ASPIRIN 81 MG PO CHEW
CHEWABLE_TABLET | ORAL | Status: AC
  Filled 2022-09-21: qty 1

## 2022-09-21 MED ORDER — VERAPAMIL HCL 2.5 MG/ML IV SOLN
INTRAVENOUS | Status: DC | PRN
  Administered 2022-09-21: 5 mL via INTRA_ARTERIAL

## 2022-09-21 MED ORDER — ASPIRIN 81 MG PO CHEW
81.0000 mg | CHEWABLE_TABLET | Freq: Once | ORAL | Status: AC
  Administered 2022-09-21: 81 mg via ORAL

## 2022-09-21 MED ORDER — MIDODRINE HCL 5 MG PO TABS: 5.0000 mg | ORAL_TABLET | Freq: Three times a day (TID) | ORAL | Status: DC

## 2022-09-21 MED ORDER — SODIUM CHLORIDE 0.9% FLUSH
3.0000 mL | Freq: Two times a day (BID) | INTRAVENOUS | Status: DC
  Administered 2022-09-21 – 2022-09-23 (×2): 3 mL via INTRAVENOUS

## 2022-09-21 MED ORDER — SODIUM CHLORIDE 0.9 % IV SOLN: 250.0000 mL | INTRAVENOUS | Status: DC | PRN

## 2022-09-21 MED ORDER — VERAPAMIL HCL 2.5 MG/ML IV SOLN
INTRAVENOUS | Status: AC
  Filled 2022-09-21: qty 2

## 2022-09-21 MED ORDER — APIXABAN 5 MG PO TABS
5.0000 mg | ORAL_TABLET | Freq: Two times a day (BID) | ORAL | Status: DC
  Administered 2022-09-21 – 2022-09-23 (×4): 5 mg via ORAL
  Filled 2022-09-21 (×4): qty 1

## 2022-09-21 MED ORDER — FUROSEMIDE 10 MG/ML IJ SOLN
20.0000 mg | Freq: Once | INTRAMUSCULAR | Status: AC
  Administered 2022-09-21: 20 mg via INTRAVENOUS

## 2022-09-21 MED ORDER — SODIUM CHLORIDE 0.9% FLUSH: 3.0000 mL | INTRAVENOUS | Status: DC | PRN

## 2022-09-21 MED ORDER — HEPARIN (PORCINE) IN NACL 1000-0.9 UT/500ML-% IV SOLN
INTRAVENOUS | Status: DC | PRN
  Administered 2022-09-21 (×2): 500 mL

## 2022-09-21 MED ORDER — HYDRALAZINE HCL 20 MG/ML IJ SOLN: 10.0000 mg | INTRAMUSCULAR | Status: DC | PRN

## 2022-09-21 MED ORDER — LABETALOL HCL 5 MG/ML IV SOLN: 10.0000 mg | INTRAVENOUS | Status: DC | PRN

## 2022-09-21 MED ORDER — ONDANSETRON HCL 4 MG/2ML IJ SOLN: 4.0000 mg | Freq: Four times a day (QID) | INTRAMUSCULAR | Status: DC | PRN

## 2022-09-21 MED ORDER — MIDODRINE HCL 5 MG PO TABS
5.0000 mg | ORAL_TABLET | Freq: Three times a day (TID) | ORAL | Status: DC
  Administered 2022-09-21 – 2022-09-23 (×5): 5 mg via ORAL
  Filled 2022-09-21 (×5): qty 1

## 2022-09-21 MED ORDER — SODIUM CHLORIDE 0.9 % IV BOLUS
500.0000 mL | Freq: Once | INTRAVENOUS | Status: AC
  Administered 2022-09-21: 500 mL via INTRAVENOUS

## 2022-09-21 MED ORDER — FENTANYL CITRATE (PF) 100 MCG/2ML IJ SOLN
INTRAMUSCULAR | Status: AC
  Filled 2022-09-21: qty 2

## 2022-09-21 MED ORDER — IOHEXOL 350 MG/ML SOLN
INTRAVENOUS | Status: DC | PRN
  Administered 2022-09-21: 30 mL

## 2022-09-21 MED ORDER — LIDOCAINE HCL (PF) 1 % IJ SOLN
INTRAMUSCULAR | Status: DC | PRN
  Administered 2022-09-21: 10 mL

## 2022-09-21 MED ORDER — ACETAMINOPHEN 325 MG PO TABS: 650.0000 mg | ORAL_TABLET | ORAL | Status: DC | PRN

## 2022-09-21 MED ORDER — FENTANYL CITRATE (PF) 100 MCG/2ML IJ SOLN
INTRAMUSCULAR | Status: DC | PRN
  Administered 2022-09-21: 25 ug via INTRAVENOUS

## 2022-09-21 MED ORDER — MIDAZOLAM HCL 2 MG/2ML IJ SOLN
INTRAMUSCULAR | Status: AC
  Filled 2022-09-21: qty 2

## 2022-09-21 SURGICAL SUPPLY — 14 items
CATH BALLN WEDGE 5F 110CM (CATHETERS) IMPLANT
CATH OPTITORQUE TIG 4.0 5F (CATHETERS) IMPLANT
DEVICE RAD COMP TR BAND LRG (VASCULAR PRODUCTS) IMPLANT
GLIDESHEATH SLEND A-KIT 6F 22G (SHEATH) IMPLANT
GUIDEWIRE ANGLED .035X150CM (WIRE) IMPLANT
GUIDEWIRE INQWIRE 1.5J.035X260 (WIRE) IMPLANT
INQWIRE 1.5J .035X260CM (WIRE) ×1
KIT HEART LEFT (KITS) ×1 IMPLANT
PACK CARDIAC CATHETERIZATION (CUSTOM PROCEDURE TRAY) ×1 IMPLANT
SHEATH GLIDE SLENDER 4/5FR (SHEATH) IMPLANT
SYR MEDRAD MARK 7 150ML (SYRINGE) ×1 IMPLANT
TRANSDUCER W/STOPCOCK (MISCELLANEOUS) ×1 IMPLANT
TUBING CIL FLEX 10 FLL-RA (TUBING) ×1 IMPLANT
WIRE EMERALD 3MM-J .025X260CM (WIRE) IMPLANT

## 2022-09-21 NOTE — TOC Benefit Eligibility Note (Signed)
Pharmacy Patient Advocate Encounter  Insurance verification completed.    The patient is insured through [facility]    Ran test claim for Eliquis and the current 30 day co-pay is $60.00.   This test claim was processed through [facility]- copay amounts may vary at other pharmacies due to pharmacy/plan contracts, or as the patient moves through the different stages of their insurance plan.

## 2022-09-21 NOTE — Progress Notes (Signed)
Palliative:  Attempted to meet with patient x2 today. Patient initially asked me to return later and upon return he requested we meet tomorrow. Will plan to follow up in AM. Role of palliative introduced.  Gerlean Ren, DNP, AGNP-C Palliative Medicine Team Team Phone # [phone]  Pager # [phone]  NO CHARGE

## 2022-09-21 NOTE — Progress Notes (Signed)
   09/21/22 0431  Assess: MEWS Score  Temp 98.8 F (37.1 C)  BP 100/73  MAP (mmHg) 82  Pulse Rate 97  ECG Heart Rate (!) 113  Resp 18  SpO2 97 %  O2 Device Room Air  Assess: MEWS Score  MEWS Temp 0  MEWS Systolic 1  MEWS Pulse 2  MEWS RR 0  MEWS LOC 0  MEWS Score 3  MEWS Score Color Yellow  Assess: if the MEWS score is Yellow or Red  Were vital signs taken at a resting state? Yes  Focused Assessment No change from prior assessment  Does the patient meet 2 or more of the SIRS criteria? No  MEWS guidelines implemented  Yes, yellow  Treat  MEWS Interventions Considered administering scheduled or prn medications/treatments as ordered  Take Vital Signs  Increase Vital Sign Frequency  Yellow: Q2hr x1, continue Q4hrs until patient remains green for 12hrs  Escalate  MEWS: Escalate Yellow: Discuss with charge nurse and consider notifying provider and/or RRT  Provider Notification  Provider Name/Title Pierre Bali MD  Date Provider Notified 09/21/22  Time Provider Notified 0430  Method of Notification  (secured chat)  Notification Reason Other (Comment) (HR 120's)  Provider response No new orders  Assess: SIRS CRITERIA  SIRS Temperature  0  SIRS Pulse 1  SIRS Respirations  0  SIRS WBC 0  SIRS Score Sum  1

## 2022-09-21 NOTE — Progress Notes (Addendum)
Patients blood pressure low this evening 81/57 (66) and 78/62 (Manual). Upon assessment patient denies dizziness or pain. Paged attending and Cardiology. Got orders for bolus. Will continue to monitor patient.    Update: cardiology added Midodrine 5mg 

## 2022-09-21 NOTE — Progress Notes (Addendum)
ANTICOAGULATION CONSULT NOTE  Pharmacy Consult for heparin Indication: atrial fibrillation  No Known Allergies  Patient Measurements: Height: 6\' 9"  (205.7 cm) Weight: 83.2 kg (183 lb 8 oz) IBW/kg (Calculated) : 98.3 Heparin Dosing Weight: 91.6 kg  Vital Signs: Temp: 98.8 F (37.1 C) (06/20 0431) Temp Source: Oral (06/20 0431) BP: 100/73 (06/20 0432) Pulse Rate: 97 (06/20 0431)  Labs: Recent Labs    09/19/22 1537 09/19/22 1548 09/19/22 1548 09/19/22 1624 09/20/22 0040 09/21/22 0222  HGB  --  13.3   < >  --  13.3 12.6*  HCT  --  39.9  --   --  40.0 38.3*  PLT  --  105*  --   --  103* 100*  APTT  --   --   --   --  85*  --   HEPARINUNFRC  --   --   --   --  0.38 0.32  CREATININE 1.23  --   --   --  1.17 1.37*  TROPONINIHS  --  15  --  12  --   --    < > = values in this interval not displayed.     Estimated Creatinine Clearance: 63.3 mL/min (A) (by C-G formula based on SCr of 1.37 mg/dL (H)).   Assessment: 67 y.o. male with h/o Afib, Eliquis on hold for cath this admit, started on IV heparin. Heparin level therapeutic at 0.32, CBC stable, no bleeding issues noted. Cath planned for today/  Goal of Therapy:  Heparin level 0.3-0.7 units/mL Monitor platelets by anticoagulation protocol: Yes   Plan:  Continue heparin 1250 units/h Daily heparin level and CBC  Fredonia Highland, PharmD, [place], [facility] Clinical Pharmacist [phone] Please check AMION for all [facility] Pharmacy numbers 09/21/2022

## 2022-09-21 NOTE — Interval H&P Note (Signed)
History and Physical Interval Note:  09/21/2022 7:36 AM  Garrett Carroll  has presented today for surgery, with the diagnosis of nstemi.  The various methods of treatment have been discussed with the patient and family. After consideration of risks, benefits and other options for treatment, the patient has consented to  Procedure(s): RIGHT/LEFT HEART CATH AND CORONARY ANGIOGRAPHY (N/A) as a surgical intervention.  The patient's history has been reviewed, patient examined, no change in status, stable for surgery.  I have reviewed the patient's chart and labs.  Questions were answered to the patient's satisfaction.    2012 Appropriate Use Criteria for Diagnostic Catheterization Cardiomyopathies (Right and Left Heart Catheterization OR Right Heart Catheterization Alone With/Without Left Ventriculography and Coronary Angiography) Indication:  Known or suspected cardiomyopathy with or without heart failure A (7) Indication: 93; Score 7 291  Ysabel Stankovich J Esther Bradstreet

## 2022-09-21 NOTE — Consult Note (Signed)
Consultation Note Date: 09/21/2022   Patient Name: Garrett Carroll  DOB: December 05, 1955  MRN: [id number]  Age / Sex: 67 y.o., male  PCP: Mirna Mires, MD Referring Physician: Zannie Cove, MD  Reason for Consultation: Establishing goals of care  HPI/Patient Profile: 67 y.o. male  with past medical history of recently diagnosed cardiomyopathy, BiV failure, EF of 20-25% with severely reduced RV, mitral regurg, A-fib, and a lung nodule admitted on 09/19/2022 with shortness of breath and lower extremity edema. Patient was recently admitted in March with a fib RVR and declined cardioversion and heart cath. 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 Patient had cardiac cath 6/20 with NICM. Concern about liver disease from RHF. PMT consulted to discuss GOC.   Clinical Assessment and Goals of Care: I have reviewed medical records including EPIC notes, labs and imaging, assessed the patient and then met with patient  to discuss diagnosis prognosis, GOC, EOL wishes, disposition and options.  I introduced Palliative Medicine as specialized medical care for people living with serious illness. It focuses on providing relief from the symptoms and stress of a serious illness. The goal is to improve quality of life for both the patient and the family.  We discussed a brief life review of the patient. Patient shares about his wife Tyler Aas and grandchildren. 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 He tells me about his current job working for a [employer] and how much he enjoys staying active and social. He has been with this company for 19 years. He also shares that his wife has cancer and is currently receiving chemotherapy.   He tells me he has maintained great functional status up until admission - still working and independent. He tells me  he has had a good appetite as well though likely doesn't eat as healthy as he should.    We discussed patient's current illness and what it means in the larger context of patient's on-going co-morbidities.  Natural disease trajectory and expectations at EOL were discussed. We discussed his severe heart disease. Dr Jomarie Longs also joined conversation and shared concern of poor outcomes. He expresses understanding.   I attempted to elicit values and goals of care important to the patient.  He shares he wants to be at home and continue to be a productive member of society.   Patient shares when he was previously admitted he did not consent to any of the recommended procedures because he was afraid. He tells me he had never been hospitalized prior to that admission in March and the medical world is foreign to him. He tells me he is now much more motivated to move forward with recommended procedures and is also committed to taking medications prescribed to him. He plans to eat healthier and wants to take better care of his body. He tells me he understands it may be "too late" but he wants to try.   Patient does share some grief about his current condition as he feels he has generally taken good care of his body and it is hard to reconcile how sick his heart it.   At this time, patient is open to any medical intervention offered that may improve his health. He would like to maintain full code status.   Patient would like Doris to be his primary HCPOA followed by his sister Governor Specking to serve as secondary. Discussed chaplain  consult for HCPOA completion and he is agreeable.   Discussed with patient the importance of continued conversation with family and the medical providers regarding overall plan of care and treatment options, ensuring decisions are within the context of the patient's values and GOCs.    Questions and concerns were addressed. The family was encouraged to call with questions or concerns.     Primary Decision Maker PATIENT  Wants to name Doris as primary HCPOA and sister Governor Specking as secondary  SUMMARY OF RECOMMENDATIONS   -patient requests full code/full scope care at this time - tells me he is committed to compliance with all medical recommendations - chaplain consult for HCPOA completion  Code Status/Advance Care Planning: Full code      Primary Diagnoses: Present on Admission:  Acute on chronic systolic CHF (congestive heart failure) (HCC)  Shortness of breath  Paroxysmal nocturnal dyspnea  Atrial fibrillation with rapid ventricular response (HCC)  Protein-calorie malnutrition, severe   I have reviewed the medical record, interviewed the patient and family, and examined the patient. The following aspects are pertinent.  Past Medical History:  Diagnosis Date   Atrial fibrillation (HCC)    Cardiomyopathy (HCC)    CHF (congestive heart failure) (HCC)    Social History   Socioeconomic History   Marital status: Married    Spouse name: Doris   Number of children: 1   Years of education: Not on file   Highest education level: Not on file  Occupational History   Not on file  Tobacco Use   Smoking status: Never   Smokeless tobacco: Not on file  Vaping Use   Vaping Use: Never used  Substance and Sexual Activity   Alcohol use: Yes    Comment: occasionally   Drug use: Not Currently    Types: Marijuana, Cocaine    Comment: drug use many years ago   Sexual activity: Yes  Other Topics Concern   Not on file  Social History Narrative   Not on file   Social Determinants of Health   Financial Resource Strain: Not on file  Food Insecurity: Not on file  Transportation Needs: Not on file  Physical Activity: Not on file  Stress: Not on file  Social Connections: Not on file   Family History  Problem Relation Age of Onset   Breast cancer Mother    Stroke Brother    Scheduled Meds:  amiodarone  200 mg Oral BID   apixaban  5 mg Oral BID    dapagliflozin propanediol  10 mg Oral Daily   sodium chloride flush  3 mL Intravenous Q12H   sodium chloride flush  3 mL Intravenous Q12H   Continuous Infusions:  sodium chloride     PRN Meds:.sodium chloride, acetaminophen **OR** acetaminophen, ondansetron **OR** ondansetron (ZOFRAN) IV, mouth rinse, senna-docusate, sodium chloride flush No Known Allergies Review of Systems  Gastrointestinal:        Feels slightly constipated, does not want to try meds yet    Physical Exam Constitutional:      General: He is not in acute distress. Pulmonary:     Effort: Pulmonary effort is normal.  Skin:    General: Skin is warm and dry.  Neurological:     Mental Status: He is alert and oriented to person, place, and time.  Psychiatric:        Mood and Affect: Mood normal.        Behavior: Behavior normal.     Vital Signs: BP (!) 115/95 (  BP Location: Left Arm)   Pulse (!) 104   Temp 98.7 F (37.1 C) (Oral)   Resp 15   Ht 6\' 9"  (2.057 m)   Wt 83.2 kg   SpO2 95%   BMI 19.66 kg/m  Pain Scale: 0-10 POSS *See Group Information*: S-Acceptable,Sleep, easy to arouse Pain Score: 0-No pain   SpO2: SpO2: 95 % O2 Device:SpO2: 95 % O2 Flow Rate: .   IO: Intake/output summary:  Intake/Output Summary (Last 24 hours) at 09/21/2022 1424 Last data filed at 09/21/2022 1109 Gross per 24 hour  Intake 1294.85 ml  Output 4240 ml  Net -2945.15 ml    LBM:   Baseline Weight: Weight: 91.6 kg Most recent weight: Weight: 83.2 kg     Palliative Assessment/Data: PPS 80%     *Please note that this is a verbal dictation therefore any spelling or grammatical errors are due to the "[employer] One" system interpretation.   Gerlean Ren, DNP, AGNP-C Palliative Medicine Team [phone] Pager: [phone]

## 2022-09-21 NOTE — Progress Notes (Signed)
Patient underwent left and right heart catheterization.  Per report no obstructive CAD and hemodynamics illustrate compensated nonischemic cardiomyopathy.  Currently diuresing well on parenteral Lasix and Farxiga.  Net IO Since Admission: -5,257.1 mL [09/21/22 1906]  Due to soft blood pressures patient was started on midodrine 3 times daily.  Educated on the importance of medication compliance.  Uptitration of medical therapy is limited due to soft blood pressures.  Agreed with attending physician with regards to palliative care consult.  Also encouraged its importance to his wife.  I  spoke to the patient yesterday regarding TEE guided cardioversion.  Discussed the risks, benefits, alternatives.  Same discussion was carried out over the phone with his wife Ms. Malena Peer.  Both are in agreement to proceed forward and acknowledged the risks/benefits/alternatives.  After careful review of history and examination, the risks, benefits of transesophageal echocardiogram, and alternatives have been explained to the patient and wife. Complications include but not limited to esophageal perforation (rare), gastrointestinal bleeding (rare), cardiac arrhythmia which can include cardiac arrest and death (rare), pharyngeal irritation / discomfort with swallowing / hematoma, methemoglobinemia, bronchospasm, transient hypoxia, nonsustained ventricular tachycardia, transient atrial fibrillation, minimal hemoptysis, vomiting, hypotension, respiratory compromise, reaction to medications, unavoidable damage to teeth and gums, aspiration pneumonia  were reviewed with the patient.  Patient and wife voices understands, provides verbal feedback, questions answered, patient and wife wishes to proceed with the procedure.  Risks, benefits, and alternatives of direct current cardioversion reviewed with the and patient and wife. Risk includes but not limited to: potential for post-cardioversion rhythms, life-threatening  arrhythmias (ventricular tachycardia and fibrillation, profound bradycardia, cardiac arrest), myocardial damage, acute pulmonary edema, skin burns, transient hypotension. Benefits include restoration of sinus rhythm. Alternatives to treatment were discussed, questions were answered, patient voices understanding and provides verbal feedback.  Patient is willing to proceed.   Tessa Lerner, [place], [facility]  Pager:  [phone] Office: [phone]

## 2022-09-21 NOTE — Progress Notes (Addendum)
PROGRESS NOTE    Garrett Carroll  ZOX:[id number] DOB: 1955/12/30 DOA: 09/19/2022 PCP: Mirna Mires, MD  66/M with recently diagnosed cardiomyopathy, BiV failure, EF of 20-25% with severely reduced RV, mitral regurg, A-fib presented to the ED with shortness of breath and lower extremity edema X 1 week. -Recent admission in March with A-fib RVR, declined cardioversion and LHC then -In the ED hypotensive, A-fib heart rate in the 1 teens, lactate 1.5, troponin 15, albumin 3.3   Subjective: -Breathing better today, feels weak  Assessment and Plan:  A-fib with RVR -Continue IV amiodarone, now on apixaban -Anticipate need for cardioversion when volume status is improved -PT eval after cardioversion   Acute on chronic systolic CHF, BiV failure Severe mitral regurgitation, severe TR Recent diagnosis -Echo 5/24 with EF 20-25%, severely reduced RV severe mitral regurgitation -Diuresed with IV Lasix, volume status improving, 3 L negative, -GDMT limited by hypotension, continue Farxiga -Hold further IV Lasix today -Cards following, cath with NICM, compensated CHF  -Overall prognosis is felt to be poor, will request palliative eval as well   Thrombocytopenia: Acute on chronic on last admission as low as 131, currently 105, monitor -May have chronic liver disease from right heart failure   Ascending aortic aneurysm -Needs follow-up   Protein-calorie malnutrition, severe- RD consulted Body mass index is 21.65 kg/m.   Abnormal TSH -Was normal 3 months ago, 16.7 now, check free T4 -Recommend repeat labs in 2 months ED    DVT prophylaxis: Apixaban Code Status: Full code Family Communication: None present Disposition Plan: Home likely 2 to 3 days  Consultants:    Procedures:   Antimicrobials:    Objective: Vitals:   09/21/22 0805 09/21/22 0810 09/21/22 0815 09/21/22 0838  BP: 92/80  93/68 (!) 115/95  Pulse: (!) 103 (!) 110 (!) 104   Resp: (!) 24 19 (!) 23 15  Temp:    98.7  F (37.1 C)  TempSrc:    Oral  SpO2: (!) 87% (!) 87% 91% 95%  Weight:      Height:        Intake/Output Summary (Last 24 hours) at 09/21/2022 1059 Last data filed at 09/21/2022 0432 Gross per 24 hour  Intake 1294.85 ml  Output 3890 ml  Net -2595.15 ml   Filed Weights   09/19/22 1524 09/20/22 0410 09/21/22 0431  Weight: 91.6 kg 89.8 kg 83.2 kg    Examination:  General exam: Chronically ill male laying in bed, AAOx3 HEENT: Positive JVD CVS: S1-S2, irregular rhythm, systolic murmur Lungs: Decreased breath sounds to bases Abdomen: Soft, nontender, bowel sounds present Extremities: 1+ edema Skin: No rashes Psychiatry:  Mood & affect appropriate.     Data Reviewed:   CBC: Recent Labs  Lab 09/19/22 1548 09/20/22 0040 09/21/22 0222  WBC 6.1 5.9 5.2  NEUTROABS 4.3  --   --   HGB 13.3 13.3 12.6*  HCT 39.9 40.0 38.3*  MCV 94.1 92.4 91.6  PLT 105* 103* 100*   Basic Metabolic Panel: Recent Labs  Lab 09/19/22 1537 09/20/22 0040 09/21/22 0222  NA 141 138 140  K 3.6 3.6 4.0  CL 107 108 106  CO2 19* 19* 24  GLUCOSE 131* 113* 94  BUN 10 11 12   CREATININE 1.23 1.17 1.37*  CALCIUM 8.8* 8.4* 8.6*   GFR: Estimated Creatinine Clearance: 63.3 mL/min (A) (by C-G formula based on SCr of 1.37 mg/dL (H)). Liver Function Tests: Recent Labs  Lab 09/19/22 1548 09/20/22 0040  AST 25 21  ALT 34  30  ALKPHOS 57 52  BILITOT 3.5* 2.7*  PROT 5.9* 5.6*  ALBUMIN 3.3* 3.1*   No results for input(s): "LIPASE", "AMYLASE" in the last 168 hours. No results for input(s): "AMMONIA" in the last 168 hours. Coagulation Profile: No results for input(s): "INR", "PROTIME" in the last 168 hours. Cardiac Enzymes: No results for input(s): "CKTOTAL", "CKMB", "CKMBINDEX", "TROPONINI" in the last 168 hours. BNP (last 3 results) Recent Labs    06/26/22 1133  PROBNP 2,812*   HbA1C: No results for input(s): "HGBA1C" in the last 72 hours. CBG: No results for input(s): "GLUCAP" in the last  168 hours. Lipid Profile: No results for input(s): "CHOL", "HDL", "LDLCALC", "TRIG", "CHOLHDL", "LDLDIRECT" in the last 72 hours. Thyroid Function Tests: Recent Labs    09/20/22 0040  TSH 16.790*   Anemia Panel: No results for input(s): "VITAMINB12", "FOLATE", "FERRITIN", "TIBC", "IRON", "RETICCTPCT" in the last 72 hours. Urine analysis: No results found for: "COLORURINE", "APPEARANCEUR", "LABSPEC", "PHURINE", "GLUCOSEU", "HGBUR", "BILIRUBINUR", "KETONESUR", "PROTEINUR", "UROBILINOGEN", "NITRITE", "LEUKOCYTESUR" Sepsis Labs: @LABRCNTIP (procalcitonin:4,lacticidven:4)  ) Recent Results (from the past 240 hour(s))  Blood culture (routine x 2)     Status: None (Preliminary result)   Collection Time: 09/19/22  4:10 PM   Specimen: BLOOD  Result Value Ref Range Status   Specimen Description BLOOD SITE NOT SPECIFIED  Final   Special Requests   Final    BOTTLES DRAWN AEROBIC AND ANAEROBIC Blood Culture results may not be optimal due to an inadequate volume of blood received in culture bottles   Culture   Final    NO GROWTH 2 DAYS Performed at [facility], [address] [place], [place] [address]    Report Status PENDING  Incomplete  Blood culture (routine x 2)     Status: None (Preliminary result)   Collection Time: 09/19/22  4:24 PM   Specimen: BLOOD  Result Value Ref Range Status   Specimen Description BLOOD SITE NOT SPECIFIED  Final   Special Requests   Final    BOTTLES DRAWN AEROBIC AND ANAEROBIC Blood Culture results may not be optimal due to an inadequate volume of blood received in culture bottles   Culture   Final    NO GROWTH 2 DAYS Performed at [facility], [address] [place], [place] [address]    Report Status PENDING  Incomplete     Radiology Studies: CARDIAC CATHETERIZATION  Result Date: 09/21/2022 LM: Normal LAD: Normal RI: Normal Lcx: Normal RCA: Normal RA: 5 mmHg RV: 32/5 mmHg PA: 27/4 mmHg, mPAP 22 mmHg PCW: Could not be obtained as catheter  could not reach the wedge position (Patient is 6 ft 9 in). LV 82/4 mmHg LVEDP 8 mmHg (Patient was given 500 cc saline bolus) No coronary artery disease Compensated nonischemic cardiomyopathy Borderline pulmonary hypertension, WHO grp II Continue GDMT for heart failure and sinus rhythm restoration Okay to resume OAC this afternoon Elder Negus, MD Pager: [phone] Office: [phone]  ECHOCARDIOGRAM LIMITED  Result Date: 09/20/2022    ECHOCARDIOGRAM LIMITED REPORT   Patient Name:   DAYSHUN GROPPER Date of Exam: 09/20/2022 Medical Rec #:  [id number]      Height:       81.0 in Accession #:    [id number]     Weight:       198.0 lb Date of Birth:  05-10-1955      BSA:          2.310 m Patient Age:    67 years  BP:           89/69 mmHg Patient Gender: M              HR:           102 bpm. Exam Location:  Inpatient Procedure: Limited Echo, Cardiac Doppler and Color Doppler Indications:     CHF  History:         Patient has prior history of Echocardiogram examinations, most                  recent 06/12/2022. CHF and Cardiomyopathy, Mitral Valve Disease;                  Arrythmias:Atrial Fibrillation.  Sonographer:     Darlys Gales Referring Phys:  [id number] SUNIT TOLIA Diagnosing Phys: Tessa Lerner DO IMPRESSIONS  1. LIMITED ECHO - for more details refer to full echo from June 12, 2022.  2. Left ventricular ejection fraction, by estimation, is <20%. The left ventricle has severely decreased function. The left ventricle demonstrates global hypokinesis. The left ventricular internal cavity size was dilated. There is mild left ventricular hypertrophy. Left ventricular diastolic function could not be evaluated.  3. RVSP is underestimated due to dilated RV and RA chambers. . Right ventricular systolic function is severely reduced. The right ventricular size is severely enlarged. There is mildly elevated pulmonary artery systolic pressure. The estimated right ventricular systolic pressure is 40.0 mmHg.  4. Left  atrial size was mildly dilated.  5. Right atrial size was severely dilated.  6. The mitral valve is degenerative. Severe mitral valve regurgitation. No evidence of mitral stenosis.  7. Tricuspid valve regurgitation is severe.  8. The aortic valve is grossly normal. Aortic valve regurgitation is not visualized. No aortic stenosis is present.  9. Aortic proximal ascending aorta at upper limit of normal (37mm). There is borderline dilatation of the aortic root, measuring 38 mm. 10. Dilated pulmonary artery. 11. The inferior vena cava is dilated in size with <50% respiratory variability, suggesting right atrial pressure of 15 mmHg. 12. Rhythm strip during this exam demonstrates atrial fibrillation. Comparison(s): A prior study was performed on 06/12/2022. No significant change. Conclusion(s)/Recommendation(s): No left ventricular mural or apical thrombus/thrombi. FINDINGS  Left Ventricle: Left ventricular ejection fraction, by estimation, is <20%. The left ventricle has severely decreased function. The left ventricle demonstrates global hypokinesis. Definity contrast agent was given IV to delineate the left ventricular endocardial borders. The left ventricular internal cavity size was dilated. There is mild left ventricular hypertrophy. Left ventricular diastolic function could not be evaluated. Left ventricular diastolic function could not be evaluated due to atrial fibrillation. Right Ventricle: RVSP is underestimated due to dilated RV and RA chambers. The right ventricular size is severely enlarged. Right ventricular systolic function is severely reduced. There is mildly elevated pulmonary artery systolic pressure. The tricuspid regurgitant velocity is 2.50 m/s, and with an assumed right atrial pressure of 15 mmHg, the estimated right ventricular systolic pressure is 40.0 mmHg. Left Atrium: Left atrial size was mildly dilated. Right Atrium: Right atrial size was severely dilated. Pericardium: There is no evidence of  pericardial effusion. Mitral Valve: The mitral valve is degenerative in appearance. Severe mitral valve regurgitation, with centrally-directed jet. No evidence of mitral valve stenosis. Tricuspid Valve: The tricuspid valve is grossly normal. Tricuspid valve regurgitation is severe. No evidence of tricuspid stenosis. Aortic Valve: The aortic valve is grossly normal. Aortic valve regurgitation is not visualized. No aortic stenosis is present. Pulmonic Valve: The  pulmonic valve was grossly normal. Aorta: Proximal ascending aorta at upper limit of normal (37mm). There is borderline dilatation of the aortic root, measuring 38 mm. Pulmonary Artery: The pulmonary artery is dilated. Venous: The inferior vena cava is dilated in size with less than 50% respiratory variability, suggesting right atrial pressure of 15 mmHg. EKG: Rhythm strip during this exam demonstrates atrial fibrillation. LEFT VENTRICLE PLAX 2D LVIDd:         7.00 cm LVIDs:         6.40 cm LV PW:         1.30 cm LV IVS:        1.10 cm LVOT diam:     2.00 cm LVOT Area:     3.14 cm  IVC IVC diam: 2.80 cm LEFT ATRIUM           Index        RIGHT ATRIUM           Index LA Vol (A2C): 62.9 ml 27.23 ml/m  RA Area:     34.40 cm LA Vol (A4C): 71.0 ml 30.73 ml/m  RA Volume:   140.00 ml 60.60 ml/m   AORTA Ao Root diam: 3.80 cm Ao Asc diam:  3.70 cm MR Peak grad:    47.3 mmHg    TRICUSPID VALVE MR Mean grad:    27.0 mmHg    TR Peak grad:   25.0 mmHg MR Vmax:         344.00 cm/s  TR Vmax:        250.00 cm/s MR Vmean:        249.0 cm/s MR PISA:         6.28 cm     SHUNTS MR PISA Eff ROA: 109 mm      Systemic Diam: 2.00 cm MR PISA Radius:  1.00 cm Sunit Tolia DO Electronically signed by Tessa Lerner DO Signature Date/Time: 09/20/2022/9:08:13 PM    Final    CT Angio Chest PE W and/or Wo Contrast  Result Date: 09/19/2022 CLINICAL DATA:  Shortness of breath and lower extremity swelling for 1 week. Dizziness starting today. EXAM: CT ANGIOGRAPHY CHEST WITH CONTRAST  TECHNIQUE: Multidetector CT imaging of the chest was performed using the standard protocol during bolus administration of intravenous contrast. Multiplanar CT image reconstructions and MIPs were obtained to evaluate the vascular anatomy. RADIATION DOSE REDUCTION: This exam was performed according to the departmental dose-optimization program which includes automated exposure control, adjustment of the mA and/or kV according to patient size and/or use of iterative reconstruction technique. CONTRAST:  75mL OMNIPAQUE IOHEXOL 350 MG/ML SOLN COMPARISON:  09/19/2022 chest radiograph FINDINGS: Cardiovascular: No filling defect is identified in the pulmonary arterial tree to suggest pulmonary embolus. Ascending thoracic aortic aneurysm 4.1 cm in diameter. Prominent main pulmonary artery favoring pulmonary arterial hypertension. Moderate cardiomegaly. There is no significant systemic arterial contrast opacification on today's exam which was timed for pulmonary arterial opacification. Mediastinum/Nodes: Hazy density in the mediastinum and subcutaneous tissues favoring mild third spacing of fluid. Lungs/Pleura: Small left and small to moderate right pleural effusions. The oval-shaped density seen on recent chest radiography is immediately along the minor fissure and is of low-density, favoring a small amount of fluid in the minor fissure. There is bandlike atelectasis in the right middle lobe and passive atelectasis associated with both pleural effusions. Mild subsegmental atelectasis in the lingula. There is some hazy dependent density in both lungs probably from low-grade edema. Upper Abdomen: Suspected mild perihepatic ascites. Suspected  mild fluid infiltration along upper abdominal adipose tissues. Musculoskeletal: Thoracic spondylosis. Review of the MIP images confirms the above findings. IMPRESSION: 1. No filling defect is identified in the pulmonary arterial tree to suggest pulmonary embolus. 2. Small left and small to  moderate right pleural effusions with passive atelectasis associated with both pleural effusions. The oval-shaped density seen on recent chest radiography is immediately along the minor fissure and is of low-density, favoring a small amount of fluid in the minor fissure. 3. Moderate cardiomegaly. 4. Prominent main pulmonary artery favoring pulmonary arterial hypertension. 5. Suspected third spacing of fluid with hazy density in mediastinal, subcutaneous, and upper abdominal adipose tissues and suspected mild perihepatic ascites. 6. Ascending thoracic aortic aneurysm 4.1 cm in diameter. Recommend annual imaging followup by CTA or MRA. This recommendation follows 2010 ACCF/AHA/AATS/ACR/ASA/SCA/SCAI/SIR/STS/SVM Guidelines for the Diagnosis and Management of Patients with Thoracic Aortic Disease. Circulation. 2010; 121: Z610-R604. Aortic aneurysm NOS (ICD10-I71.9) 7. Thoracic spondylosis. Electronically Signed   By: Gaylyn Rong M.D.   On: 09/19/2022 18:43   VAS [place] LOWER EXTREMITY VENOUS (DVT)  Result Date: 09/19/2022  Lower Venous DVT Study Patient Name:  BAYLE YARA  Date of Exam:   09/19/2022 Medical Rec #: [id number]       Accession #:    [id number] Date of Birth: [birth year]       Patient Gender: M Patient Age:   33 years Exam Location:  [facility] Procedure:      VAS [place] LOWER EXTREMITY VENOUS (DVT) Referring Phys: ADAM CURATOLO --------------------------------------------------------------------------------  Indications: Edema, and SOB.  Comparison Study: No previous study. Performing Technologist: McKayla Maag RVT, [place]  Examination Guidelines: A complete evaluation includes B-mode imaging, spectral Doppler, color Doppler, and power Doppler as needed of all accessible portions of each vessel. Bilateral testing is considered an integral part of a complete examination. Limited examinations for reoccurring indications may be performed as noted. The reflux portion of the exam is performed with the  patient in reverse Trendelenburg.  +---------+---------------+---------+-----------+----------+--------------+ RIGHT    CompressibilityPhasicitySpontaneityPropertiesThrombus Aging +---------+---------------+---------+-----------+----------+--------------+ CFV      Full           Yes      Yes                                 +---------+---------------+---------+-----------+----------+--------------+ SFJ      Full                                                        +---------+---------------+---------+-----------+----------+--------------+ FV Prox  Full                                                        +---------+---------------+---------+-----------+----------+--------------+ FV Mid   Full                                                        +---------+---------------+---------+-----------+----------+--------------+ FV DistalFull                                                        +---------+---------------+---------+-----------+----------+--------------+  PFV      Full                                                        +---------+---------------+---------+-----------+----------+--------------+ POP      Full           Yes      Yes                                 +---------+---------------+---------+-----------+----------+--------------+ PTV      Full                                                        +---------+---------------+---------+-----------+----------+--------------+ PERO     Full                                                        +---------+---------------+---------+-----------+----------+--------------+   +----+---------------+---------+-----------+----------+--------------+ LEFTCompressibilityPhasicitySpontaneityPropertiesThrombus Aging +----+---------------+---------+-----------+----------+--------------+ CFV Full           Yes      Yes                                  +----+---------------+---------+-----------+----------+--------------+ SFJ Full                                                        +----+---------------+---------+-----------+----------+--------------+     Summary: RIGHT: - There is no evidence of deep vein thrombosis in the lower extremity.  - No cystic structure found in the popliteal fossa.  LEFT: - No evidence of common femoral vein obstruction.  *See table(s) above for measurements and observations. Electronically signed by Waverly Ferrari MD on 09/19/2022 at 6:19:35 PM.    Final    DG Chest 2 View  Result Date: 09/19/2022 CLINICAL DATA:  Shortness of breath. EXAM: CHEST - 2 VIEW COMPARISON:  June 11, 2022. FINDINGS: Stable cardiomegaly. Mild bibasilar subsegmental atelectasis or edema is noted with small bilateral pleural effusions. Stable ovoid shaped nodular density seen in right midlung. Bony thorax is unremarkable. IMPRESSION: Cardiomegaly. Mild bibasilar subsegmental atelectasis or edema is noted with small pleural effusions. Grossly stable ovoid nodular density seen in right midlung; follow-up CT scan or chest radiograph in 3 months is recommended to ensure stability. Electronically Signed   By: Lupita Raider M.D.   On: 09/19/2022 16:30     Scheduled Meds:  amiodarone  200 mg Oral BID   apixaban  5 mg Oral BID   dapagliflozin propanediol  10 mg Oral Daily   furosemide  40 mg Intravenous BID   sodium chloride flush  3 mL Intravenous Q12H   sodium chloride flush  3 mL Intravenous Q12H   Continuous Infusions:  sodium chloride  LOS: 2 days    Time spent:    Zannie Cove, MD [facility]   09/21/2022, 10:59 AM

## 2022-09-21 NOTE — TOC Initial Note (Signed)
Transition of Care [facility]) - Initial/Assessment Note    Patient Details  Name: Garrett Carroll MRN: [id number] Date of Birth: [birth year]  Transition of Care [facility]) CM/SW Contact:    Gala Lewandowsky, RN Phone Number: 09/21/2022, 1:47 PM  Clinical Narrative: Patient presented for shortness of breath and lower extremity leg swelling. PTA patient was independent from home. Patient states he gets to appointments without any issues and has insurance. Benefits check has been submitted for Eliquis. Co pay card at the bedside. Case Manager will continue to follow for additional transition of care needs.                  Expected Discharge Plan: Home/Self Care Barriers to Discharge: Continued Medical Work up   Patient Goals and CMS Choice Patient states their goals for this hospitalization and ongoing recovery are:: to return home.   Choice offered to / list presented to : NA    Expected Discharge Plan and Services In-house Referral: NA Discharge Planning Services: CM Consult   Living arrangements for the past 2 months: Single Family Home                   DME Agency: NA  Prior Living Arrangements/Services Living arrangements for the past 2 months: Single Family Home Lives with:: Significant Other Patient language and need for interpreter reviewed:: Yes        Need for Family Participation in Patient Care: Yes (Comment) Care giver support system in place?: Yes (comment)   Criminal Activity/Legal Involvement Pertinent to Current Situation/Hospitalization: No - Comment as needed  Permission Sought/Granted Permission sought to share information with : Case Manager, Family Supports   Emotional Assessment Appearance:: Appears stated age Attitude/Demeanor/Rapport: Engaged Affect (typically observed): Appropriate Orientation: : Oriented to Self, Oriented to Place, Oriented to  Time, Oriented to Situation Alcohol / Substance Use: Not Applicable Psych Involvement: No  (comment)  Admission diagnosis:  Acute on chronic systolic CHF (congestive heart failure) (HCC) [I50.23] Atrial fibrillation, unspecified type (HCC) [I48.91] Acute on chronic congestive heart failure, unspecified heart failure type (HCC) [I50.9] Patient Active Problem List   Diagnosis Date Noted   HFrEF (heart failure with reduced ejection fraction) (HCC) 09/21/2022   Acute on chronic systolic CHF (congestive heart failure) (HCC) 09/19/2022   Biventricular heart failure, NYHA class 3 (HCC) 09/19/2022   Long term current use of antiarrhythmic drug 09/19/2022   Long term (current) use of anticoagulants 09/19/2022   Cardiomyopathy (HCC) 09/19/2022   Positive D dimer 09/19/2022   Aneurysm of ascending aorta without rupture (HCC) 09/19/2022   Non compliance w medication regimen 09/19/2022   Atrial fibrillation (HCC) 06/12/2022   Protein-calorie malnutrition, severe 06/12/2022   New onset atrial fibrillation (HCC) 06/11/2022   Shortness of breath 06/11/2022   Paroxysmal nocturnal dyspnea 06/11/2022   Localized swelling of lower extremity 06/11/2022   Elevated brain natriuretic peptide (BNP) level 06/11/2022   Abnormal chest x-ray 06/11/2022   Atrial fibrillation with rapid ventricular response (HCC) 06/11/2022   PCP:  Mirna Mires, MD Pharmacy:   [facility] [phone] [place], [place] - [address] [place] [address] Phone: [phone] Fax: [phone]  Social Determinants of Health (SDOH) Social History: SDOH Screenings   Tobacco Use: Unknown (09/19/2022)   Readmission Risk Interventions     No data to display

## 2022-09-22 ENCOUNTER — Other Ambulatory Visit (HOSPITAL_BASED_OUTPATIENT_CLINIC_OR_DEPARTMENT_OTHER): Payer: Self-pay

## 2022-09-22 ENCOUNTER — Inpatient Hospital Stay (HOSPITAL_COMMUNITY): Payer: BC Managed Care – PPO | Admitting: Anesthesiology

## 2022-09-22 ENCOUNTER — Encounter (HOSPITAL_COMMUNITY)
Admission: EM | Disposition: A | Discharge status: Home / Self Care | Payer: Self-pay | Source: Home / Self Care | Attending: Internal Medicine

## 2022-09-22 ENCOUNTER — Encounter (HOSPITAL_COMMUNITY): Payer: Self-pay | Admitting: Cardiology

## 2022-09-22 ENCOUNTER — Inpatient Hospital Stay (HOSPITAL_COMMUNITY): Payer: BC Managed Care – PPO

## 2022-09-22 ENCOUNTER — Other Ambulatory Visit (HOSPITAL_COMMUNITY): Payer: Self-pay

## 2022-09-22 DIAGNOSIS — Z91148 Patient's other noncompliance with medication regimen for other reason: Secondary | ICD-10-CM

## 2022-09-22 DIAGNOSIS — Z79899 Other long term (current) drug therapy: Secondary | ICD-10-CM

## 2022-09-22 DIAGNOSIS — Z7901 Long term (current) use of anticoagulants: Secondary | ICD-10-CM | POA: Diagnosis not present

## 2022-09-22 DIAGNOSIS — I4819 Other persistent atrial fibrillation: Secondary | ICD-10-CM

## 2022-09-22 DIAGNOSIS — I428 Other cardiomyopathies: Secondary | ICD-10-CM | POA: Diagnosis not present

## 2022-09-22 DIAGNOSIS — I7121 Aneurysm of the ascending aorta, without rupture: Secondary | ICD-10-CM

## 2022-09-22 DIAGNOSIS — Z7189 Other specified counseling: Secondary | ICD-10-CM

## 2022-09-22 DIAGNOSIS — Z515 Encounter for palliative care: Secondary | ICD-10-CM

## 2022-09-22 DIAGNOSIS — I5082 Biventricular heart failure: Secondary | ICD-10-CM | POA: Diagnosis not present

## 2022-09-22 HISTORY — PX: TEE WITHOUT CARDIOVERSION: SHX5443

## 2022-09-22 HISTORY — PX: CARDIOVERSION: SHX1299

## 2022-09-22 LAB — BASIC METABOLIC PANEL
Anion gap: 9 (ref 5–15)
BUN: 12 mg/dL (ref 8–23)
CO2: 24 mmol/L (ref 22–32)
Calcium: 8.3 mg/dL — ABNORMAL LOW (ref 8.9–10.3)
Chloride: 103 mmol/L (ref 98–111)
Creatinine, Ser: 1.36 mg/dL — ABNORMAL HIGH (ref 0.61–1.24)
GFR, Estimated: 58 mL/min — ABNORMAL LOW (ref >60)
Glucose, Bld: 94 mg/dL (ref 70–99)
Potassium: 3.9 mmol/L (ref 3.5–5.1)
Sodium: 136 mmol/L (ref 135–145)

## 2022-09-22 LAB — CBC
HCT: 37.9 % — ABNORMAL LOW (ref 39.0–52.0)
Hemoglobin: 12.8 g/dL — ABNORMAL LOW (ref 13.0–17.0)
MCH: 31.5 pg (ref 26.0–34.0)
MCHC: 33.8 g/dL (ref 30.0–36.0)
MCV: 93.3 fL (ref 80.0–100.0)
Platelets: 108 10*3/uL — ABNORMAL LOW (ref 150–400)
RBC: 4.06 MIL/uL — ABNORMAL LOW (ref 4.22–5.81)
RDW: 15.2 % (ref 11.5–15.5)
WBC: 4.2 10*3/uL (ref 4.0–10.5)
nRBC: 0 % (ref 0.0–0.2)

## 2022-09-22 LAB — CULTURE, BLOOD (ROUTINE X 2)

## 2022-09-22 LAB — ECHO TEE: Est EF: <20

## 2022-09-22 LAB — BRAIN NATRIURETIC PEPTIDE: B Natriuretic Peptide: 415.1 pg/mL — ABNORMAL HIGH (ref 0.0–100.0)

## 2022-09-22 SURGERY — ECHOCARDIOGRAM, TRANSESOPHAGEAL
Anesthesia: Monitor Anesthesia Care

## 2022-09-22 MED ORDER — LIDOCAINE 2% (20 MG/ML) 5 ML SYRINGE
INTRAMUSCULAR | Status: DC | PRN
  Administered 2022-09-22: 50 mg via INTRAVENOUS

## 2022-09-22 MED ORDER — CARVEDILOL 3.125 MG PO TABS
3.1250 mg | ORAL_TABLET | Freq: Two times a day (BID) | ORAL | 0 refills | Status: DC
  Filled 2022-09-22: qty 60, 30d supply, fill #0

## 2022-09-22 MED ORDER — KETAMINE HCL 10 MG/ML IJ SOLN
INTRAMUSCULAR | Status: DC | PRN
  Administered 2022-09-22 (×2): 10 mg via INTRAVENOUS

## 2022-09-22 MED ORDER — IVABRADINE HCL 7.5 MG PO TABS
7.5000 mg | ORAL_TABLET | Freq: Two times a day (BID) | ORAL | 0 refills | Status: DC
  Filled 2022-09-22: qty 60, 30d supply, fill #0

## 2022-09-22 MED ORDER — DAPAGLIFLOZIN PROPANEDIOL 10 MG PO TABS
10.0000 mg | ORAL_TABLET | Freq: Every day | ORAL | 0 refills | Status: DC
  Filled 2022-09-22: qty 30, 30d supply, fill #0

## 2022-09-22 MED ORDER — PHENYLEPHRINE HCL-NACL 20-0.9 MG/250ML-% IV SOLN
INTRAVENOUS | Status: DC | PRN
  Administered 2022-09-22: 50 ug/min via INTRAVENOUS

## 2022-09-22 MED ORDER — PHENYLEPHRINE 80 MCG/ML (10ML) SYRINGE FOR IV PUSH (FOR BLOOD PRESSURE SUPPORT)
PREFILLED_SYRINGE | INTRAVENOUS | Status: DC | PRN
  Administered 2022-09-22 (×2): 160 ug via INTRAVENOUS
  Administered 2022-09-22: 80 ug via INTRAVENOUS
  Administered 2022-09-22: 160 ug via INTRAVENOUS

## 2022-09-22 MED ORDER — PROPOFOL 10 MG/ML IV BOLUS
INTRAVENOUS | Status: DC | PRN
  Administered 2022-09-22: 20 mg via INTRAVENOUS
  Administered 2022-09-22 (×3): 10 mg via INTRAVENOUS

## 2022-09-22 MED ORDER — BUTAMBEN-TETRACAINE-BENZOCAINE 2-2-14 % EX AERO
INHALATION_SPRAY | CUTANEOUS | Status: AC
  Filled 2022-09-22: qty 20

## 2022-09-22 MED ORDER — IVABRADINE HCL 5 MG PO TABS
7.5000 mg | ORAL_TABLET | Freq: Two times a day (BID) | ORAL | Status: DC
  Administered 2022-09-22 – 2022-09-23 (×2): 7.5 mg via ORAL
  Filled 2022-09-22 (×3): qty 1

## 2022-09-22 MED ORDER — MIDODRINE HCL 5 MG PO TABS
5.0000 mg | ORAL_TABLET | Freq: Two times a day (BID) | ORAL | 0 refills | Status: DC
  Filled 2022-09-22: qty 60, 30d supply, fill #0

## 2022-09-22 MED ORDER — KETAMINE HCL 50 MG/5ML IJ SOSY
PREFILLED_SYRINGE | INTRAMUSCULAR | Status: AC
  Filled 2022-09-22: qty 5

## 2022-09-22 MED ORDER — AMIODARONE HCL 200 MG PO TABS
200.0000 mg | ORAL_TABLET | Freq: Two times a day (BID) | ORAL | 0 refills | Status: DC
Start: 2022-09-22 — End: 2022-11-10
  Filled 2022-09-22: qty 60, 30d supply, fill #0

## 2022-09-22 MED ORDER — NOREPINEPHRINE 4 MG/250ML-% IV SOLN
INTRAVENOUS | Status: AC
  Filled 2022-09-22: qty 250

## 2022-09-22 SURGICAL SUPPLY — 1 items: ELECT DEFIB PAD ADLT CADENCE (PAD) ×1 IMPLANT

## 2022-09-22 NOTE — Progress Notes (Signed)
PROGRESS NOTE    Garrett Carroll  ZOX:[id number] DOB: 03-24-1956 DOA: 09/19/2022 PCP: Mirna Mires, MD  67/M with recently diagnosed cardiomyopathy, BiV failure, EF of 20-25% with severely reduced RV, mitral regurg, A-fib presented to the ED with shortness of breath and lower extremity edema X 1 week. -Recent admission in March with A-fib RVR, declined cardioversion and LHC then -In the ED hypotensive, A-fib heart rate in the 1 teens, lactate 1.5, troponin 15, albumin 3.3 -Cath 6/20 noted nonischemic cardiomyopathy, compensated  Subjective: -Feels better, breathing improved overall  Assessment and Plan:  A-fib with RVR -Continue IV amiodarone, now on apixaban   Acute on chronic systolic CHF, BiV failure Severe mitral regurgitation, severe TR Recent diagnosis -Echo 5/24 with EF 20-25%, severely reduced RV severe mitral regurgitation -Diuresed with IV Lasix, volume status improving, 5.5 L negative -GDMT limited by hypotension, continue Farxiga -Hold further IV Lasix -Cards following, cath with NICM, compensated CHF  -Overall prognosis is felt to be poor, palliative consulted   Thrombocytopenia: Acute on chronic on last admission as low as 131, currently 105, monitor -May have chronic liver disease from right heart failure   Ascending aortic aneurysm -Needs follow-up   Protein-calorie malnutrition, severe- RD consulted Body mass index is 21.65 kg/m.   Abnormal TSH -Was normal 3 months ago, 16.7 now, check free T4 -Recommend repeat labs in 2 months ED    DVT prophylaxis: Apixaban Code Status: Full code Family Communication: None present Disposition Plan: Home likely 1 to 2 days  Consultants:    Procedures:   Antimicrobials:    Objective: Vitals:   09/22/22 0003 09/22/22 0019 09/22/22 0340 09/22/22 0745  BP: (!) 80/54  94/78 99/71  Pulse:  (!) 109 (!) 105 92  Resp:   16 15  Temp:   98.9 F (37.2 C) 98.6 F (37 C)  TempSrc:   Oral Oral  SpO2:  (!) 88% 97%  93%  Weight:   80 kg   Height:        Intake/Output Summary (Last 24 hours) at 09/22/2022 1007 Last data filed at 09/21/2022 2345 Gross per 24 hour  Intake --  Output 2530 ml  Net -2530 ml   Filed Weights   09/20/22 0410 09/21/22 0431 09/22/22 0340  Weight: 89.8 kg 83.2 kg 80 kg    Examination:  General exam: Chronically ill male laying in bed, AAOx3 HEENT: No JVD CVS: S1-S2, irregular rhythm, systolic murmur Lungs: Decreased breath sounds to bases Abdomen: Soft, nontender, bowel sounds present Extremities: Trace edema Skin: No rashes Psychiatry:  Mood & affect appropriate.     Data Reviewed:   CBC: Recent Labs  Lab 09/19/22 1548 09/20/22 0040 09/21/22 0222 09/21/22 0801 09/21/22 0805 09/21/22 0806 09/21/22 0811 09/22/22 0248  WBC 6.1 5.9 5.2  --   --   --   --  4.2  NEUTROABS 4.3  --   --   --   --   --   --   --   HGB 13.3 13.3 12.6* 12.6* 12.2* 12.6* 12.6* 12.8*  HCT 39.9 40.0 38.3* 37.0* 36.0* 37.0* 37.0* 37.9*  MCV 94.1 92.4 91.6  --   --   --   --  93.3  PLT 105* 103* 100*  --   --   --   --  108*   Basic Metabolic Panel: Recent Labs  Lab 09/19/22 1537 09/20/22 0040 09/21/22 0222 09/21/22 0801 09/21/22 0805 09/21/22 0806 09/21/22 0811 09/22/22 0248  NA 141 138 140 141 141  141 141 136  K 3.6 3.6 4.0 3.6 3.5 3.7 3.6 3.9  CL 107 108 106  --   --   --   --  103  CO2 19* 19* 24  --   --   --   --  24  GLUCOSE 131* 113* 94  --   --   --   --  94  BUN 10 11 12   --   --   --   --  12  CREATININE 1.23 1.17 1.37*  --   --   --   --  1.36*  CALCIUM 8.8* 8.4* 8.6*  --   --   --   --  8.3*   GFR: Estimated Creatinine Clearance: 61.3 mL/min (A) (by C-G formula based on SCr of 1.36 mg/dL (H)). Liver Function Tests: Recent Labs  Lab 09/19/22 1548 09/20/22 0040  AST 25 21  ALT 34 30  ALKPHOS 57 52  BILITOT 3.5* 2.7*  PROT 5.9* 5.6*  ALBUMIN 3.3* 3.1*   No results for input(s): "LIPASE", "AMYLASE" in the last 168 hours. No results for input(s):  "AMMONIA" in the last 168 hours. Coagulation Profile: No results for input(s): "INR", "PROTIME" in the last 168 hours. Cardiac Enzymes: No results for input(s): "CKTOTAL", "CKMB", "CKMBINDEX", "TROPONINI" in the last 168 hours. BNP (last 3 results) Recent Labs    06/26/22 1133  PROBNP 2,812*   HbA1C: No results for input(s): "HGBA1C" in the last 72 hours. CBG: No results for input(s): "GLUCAP" in the last 168 hours. Lipid Profile: No results for input(s): "CHOL", "HDL", "LDLCALC", "TRIG", "CHOLHDL", "LDLDIRECT" in the last 72 hours. Thyroid Function Tests: Recent Labs    09/20/22 0040  TSH 16.790*   Anemia Panel: No results for input(s): "VITAMINB12", "FOLATE", "FERRITIN", "TIBC", "IRON", "RETICCTPCT" in the last 72 hours. Urine analysis: No results found for: "COLORURINE", "APPEARANCEUR", "LABSPEC", "PHURINE", "GLUCOSEU", "HGBUR", "BILIRUBINUR", "KETONESUR", "PROTEINUR", "UROBILINOGEN", "NITRITE", "LEUKOCYTESUR" Sepsis Labs: @LABRCNTIP (procalcitonin:4,lacticidven:4)  ) Recent Results (from the past 240 hour(s))  Blood culture (routine x 2)     Status: None (Preliminary result)   Collection Time: 09/19/22  4:10 PM   Specimen: BLOOD  Result Value Ref Range Status   Specimen Description BLOOD SITE NOT SPECIFIED  Final   Special Requests   Final    BOTTLES DRAWN AEROBIC AND ANAEROBIC Blood Culture results may not be optimal due to an inadequate volume of blood received in culture bottles   Culture   Final    NO GROWTH 3 DAYS Performed at [facility], [address] [place], [place] [address]    Report Status PENDING  Incomplete  Blood culture (routine x 2)     Status: None (Preliminary result)   Collection Time: 09/19/22  4:24 PM   Specimen: BLOOD  Result Value Ref Range Status   Specimen Description BLOOD SITE NOT SPECIFIED  Final   Special Requests   Final    BOTTLES DRAWN AEROBIC AND ANAEROBIC Blood Culture results may not be optimal due to an inadequate  volume of blood received in culture bottles   Culture   Final    NO GROWTH 3 DAYS Performed at [facility], [address], [place], [place] [address]    Report Status PENDING  Incomplete     Radiology Studies: CARDIAC CATHETERIZATION  Result Date: 09/21/2022 LM: Normal LAD: Normal RI: Normal Lcx: Normal RCA: Normal RA: 5 mmHg RV: 32/5 mmHg PA: 27/4 mmHg, mPAP 22 mmHg PCW: Could not  be obtained as catheter could not reach the wedge position (Patient is 6 ft 9 in). LV 82/4 mmHg LVEDP 8 mmHg (Patient was given 500 cc saline bolus) No coronary artery disease Compensated nonischemic cardiomyopathy Borderline pulmonary hypertension, WHO grp II Continue GDMT for heart failure and sinus rhythm restoration Okay to resume OAC this afternoon Elder Negus, MD Pager: [phone] Office: [phone]  ECHOCARDIOGRAM LIMITED  Result Date: 09/20/2022    ECHOCARDIOGRAM LIMITED REPORT   Patient Name:   Garrett Carroll Date of Exam: 09/20/2022 Medical Rec #:  [id number]      Height:       81.0 in Accession #:    [id number]     Weight:       198.0 lb Date of Birth:  17-Jun-1955      BSA:          2.310 m Patient Age:    66 years       BP:           89/69 mmHg Patient Gender: M              HR:           102 bpm. Exam Location:  Inpatient Procedure: Limited Echo, Cardiac Doppler and Color Doppler Indications:     CHF  History:         Patient has prior history of Echocardiogram examinations, most                  recent 06/12/2022. CHF and Cardiomyopathy, Mitral Valve Disease;                  Arrythmias:Atrial Fibrillation.  Sonographer:     Darlys Gales Referring Phys:  [id number] SUNIT TOLIA Diagnosing Phys: Tessa Lerner DO IMPRESSIONS  1. LIMITED ECHO - for more details refer to full echo from June 12, 2022.  2. Left ventricular ejection fraction, by estimation, is <20%. The left ventricle has severely decreased function. The left ventricle demonstrates global hypokinesis. The left ventricular internal cavity  size was dilated. There is mild left ventricular hypertrophy. Left ventricular diastolic function could not be evaluated.  3. RVSP is underestimated due to dilated RV and RA chambers. . Right ventricular systolic function is severely reduced. The right ventricular size is severely enlarged. There is mildly elevated pulmonary artery systolic pressure. The estimated right ventricular systolic pressure is 40.0 mmHg.  4. Left atrial size was mildly dilated.  5. Right atrial size was severely dilated.  6. The mitral valve is degenerative. Severe mitral valve regurgitation. No evidence of mitral stenosis.  7. Tricuspid valve regurgitation is severe.  8. The aortic valve is grossly normal. Aortic valve regurgitation is not visualized. No aortic stenosis is present.  9. Aortic proximal ascending aorta at upper limit of normal (37mm). There is borderline dilatation of the aortic root, measuring 38 mm. 10. Dilated pulmonary artery. 11. The inferior vena cava is dilated in size with <50% respiratory variability, suggesting right atrial pressure of 15 mmHg. 12. Rhythm strip during this exam demonstrates atrial fibrillation. Comparison(s): A prior study was performed on 06/12/2022. No significant change. Conclusion(s)/Recommendation(s): No left ventricular mural or apical thrombus/thrombi. FINDINGS  Left Ventricle: Left ventricular ejection fraction, by estimation, is <20%. The left ventricle has severely decreased function. The left ventricle demonstrates global hypokinesis. Definity contrast agent was given IV to delineate the left ventricular endocardial borders. The left ventricular internal cavity size was dilated. There is mild left ventricular hypertrophy. Left ventricular diastolic function  could not be evaluated. Left ventricular diastolic function could not be evaluated due to atrial fibrillation. Right Ventricle: RVSP is underestimated due to dilated RV and RA chambers. The right ventricular size is severely enlarged.  Right ventricular systolic function is severely reduced. There is mildly elevated pulmonary artery systolic pressure. The tricuspid regurgitant velocity is 2.50 m/s, and with an assumed right atrial pressure of 15 mmHg, the estimated right ventricular systolic pressure is 40.0 mmHg. Left Atrium: Left atrial size was mildly dilated. Right Atrium: Right atrial size was severely dilated. Pericardium: There is no evidence of pericardial effusion. Mitral Valve: The mitral valve is degenerative in appearance. Severe mitral valve regurgitation, with centrally-directed jet. No evidence of mitral valve stenosis. Tricuspid Valve: The tricuspid valve is grossly normal. Tricuspid valve regurgitation is severe. No evidence of tricuspid stenosis. Aortic Valve: The aortic valve is grossly normal. Aortic valve regurgitation is not visualized. No aortic stenosis is present. Pulmonic Valve: The pulmonic valve was grossly normal. Aorta: Proximal ascending aorta at upper limit of normal (37mm). There is borderline dilatation of the aortic root, measuring 38 mm. Pulmonary Artery: The pulmonary artery is dilated. Venous: The inferior vena cava is dilated in size with less than 50% respiratory variability, suggesting right atrial pressure of 15 mmHg. EKG: Rhythm strip during this exam demonstrates atrial fibrillation. LEFT VENTRICLE PLAX 2D LVIDd:         7.00 cm LVIDs:         6.40 cm LV PW:         1.30 cm LV IVS:        1.10 cm LVOT diam:     2.00 cm LVOT Area:     3.14 cm  IVC IVC diam: 2.80 cm LEFT ATRIUM           Index        RIGHT ATRIUM           Index LA Vol (A2C): 62.9 ml 27.23 ml/m  RA Area:     34.40 cm LA Vol (A4C): 71.0 ml 30.73 ml/m  RA Volume:   140.00 ml 60.60 ml/m   AORTA Ao Root diam: 3.80 cm Ao Asc diam:  3.70 cm MR Peak grad:    47.3 mmHg    TRICUSPID VALVE MR Mean grad:    27.0 mmHg    TR Peak grad:   25.0 mmHg MR Vmax:         344.00 cm/s  TR Vmax:        250.00 cm/s MR Vmean:        249.0 cm/s MR PISA:          6.28 cm     SHUNTS MR PISA Eff ROA: 109 mm      Systemic Diam: 2.00 cm MR PISA Radius:  1.00 cm Sunit Tolia DO Electronically signed by Tessa Lerner DO Signature Date/Time: 09/20/2022/9:08:13 PM    Final      Scheduled Meds:  amiodarone  200 mg Oral BID   apixaban  5 mg Oral BID   dapagliflozin propanediol  10 mg Oral Daily   midodrine  5 mg Oral TID WC   sodium chloride flush  3 mL Intravenous Q12H   sodium chloride flush  3 mL Intravenous Q12H   Continuous Infusions:  sodium chloride       LOS: 3 days    Time spent:    Zannie Cove, MD [facility]   09/22/2022, 10:07 AM

## 2022-09-22 NOTE — Anesthesia Preprocedure Evaluation (Addendum)
Anesthesia Evaluation  Patient identified by MRN, date of birth, ID band Patient awake    Reviewed: Allergy & Precautions, NPO status , Patient's Chart, lab work & pertinent test results  History of Anesthesia Complications Negative for: history of anesthetic complications  Airway Mallampati: III  TM Distance: >3 FB Neck ROM: Full    Dental  (+) Dental Advisory Given,  Missing several teeth.:   Pulmonary shortness of breath, neg sleep apnea, neg COPD, neg recent URI   Pulmonary exam normal breath sounds clear to auscultation       Cardiovascular (-) hypertension(-) angina +CHF (EF <20%), + Orthopnea (sleeps on 2 pillows) and + PND (improved since being in the hospital and being diuresed)  (-) Past MI, (-) Cardiac Stents and (-) CABG + dysrhythmias (prolonged QT) Atrial Fibrillation  Rhythm:Regular Rate:Normal  Ascending thoracic aortic aneurysm 4.1 cm in diameter  R/LHC 09/21/2022:  LM: Normal LAD: Normal RI: Normal Lcx: Normal RCA: Normal   RA: 5 mmHg RV: 32/5 mmHg PA: 27/4 mmHg, mPAP 22 mmHg PCW: Could not be obtained as catheter could not reach the wedge position (Patient is 6 ft 9 in).   LV 82/4 mmHg LVEDP 8 mmHg (Patient was given 500 cc saline bolus)   No coronary artery disease Compensated nonischemic cardiomyopathy Borderline pulmonary hypertension, WHO grp II Continue GDMT for heart failure and sinus rhythm restoration Okay to resume OAC this afternoon  TTE 09/20/2022: IMPRESSIONS     1. LIMITED ECHO - for more details refer to full echo from June 12, 2022.   2. Left ventricular ejection fraction, by estimation, is <20%. The left  ventricle has severely decreased function. The left ventricle demonstrates  global hypokinesis. The left ventricular internal cavity size was dilated.  There is mild left ventricular  hypertrophy. Left ventricular diastolic function could not be evaluated.   3. RVSP is  underestimated due to dilated RV and RA chambers. . Right  ventricular systolic function is severely reduced. The right ventricular  size is severely enlarged. There is mildly elevated pulmonary artery  systolic pressure. The estimated right  ventricular systolic pressure is 40.0 mmHg.   4. Left atrial size was mildly dilated.   5. Right atrial size was severely dilated.   6. The mitral valve is degenerative. Severe mitral valve regurgitation.  No evidence of mitral stenosis.   7. Tricuspid valve regurgitation is severe.   8. The aortic valve is grossly normal. Aortic valve regurgitation is not  visualized. No aortic stenosis is present.   9. Aortic proximal ascending aorta at upper limit of normal (37mm). There  is borderline dilatation of the aortic root, measuring 38 mm.  10. Dilated pulmonary artery.  11. The inferior vena cava is dilated in size with <50% respiratory  variability, suggesting right atrial pressure of 15 mmHg.  12. Rhythm strip during this exam demonstrates atrial fibrillation.      Neuro/Psych negative neurological ROS     GI/Hepatic negative GI ROS, Neg liver ROS,,,  Endo/Other  negative endocrine ROS    Renal/GU negative Renal ROS     Musculoskeletal   Abdominal  (+) - obese  Peds  Hematology  (+) Blood dyscrasia (thrombocytopenia)   Anesthesia Other Findings 66/M with recently diagnosed cardiomyopathy, BiV failure, EF of 20-25% with severely reduced RV, mitral regurg, A-fib presented to the ED with shortness of breath and lower extremity edema X 1 week.  Reproductive/Obstetrics  Anesthesia Physical Anesthesia Plan  ASA: 4  Anesthesia Plan: MAC   Post-op Pain Management: Minimal or no pain anticipated   Induction: Intravenous  PONV Risk Score and Plan: 1 and Propofol infusion and Treatment may vary due to age or medical condition  Airway Management Planned: Natural Airway and Nasal  Cannula  Additional Equipment:   Intra-op Plan:   Post-operative Plan:   Informed Consent: I have reviewed the patients History and Physical, chart, labs and discussed the procedure including the risks, benefits and alternatives for the proposed anesthesia with the patient or authorized representative who has indicated his/her understanding and acceptance.     Dental advisory given  Plan Discussed with: CRNA and Anesthesiologist  Anesthesia Plan Comments: (Discussed with patient risks of MAC including, but not limited to, minor pain or discomfort, hearing people in the room, and possible need for backup general anesthesia. Risks for general anesthesia also discussed including, but not limited to, sore throat, hoarse voice, chipped/damaged teeth, injury to vocal cords, nausea and vomiting, allergic reactions, lung infection, increased risk of heart attack and cardiac arrest given his heart comorbidities, stroke, and death. All questions answered. )        Anesthesia Quick Evaluation

## 2022-09-22 NOTE — Plan of Care (Signed)
  Problem: Education: Goal: Ability to demonstrate management of disease process will improve Outcome: Progressing Goal: Ability to verbalize understanding of medication therapies will improve Outcome: Progressing Goal: Individualized Educational Video(s) Outcome: Progressing   Problem: Activity: Goal: Capacity to carry out activities will improve Outcome: Progressing   Problem: Cardiac: Goal: Ability to achieve and maintain adequate cardiopulmonary perfusion will improve Outcome: Progressing   Problem: Education: Goal: Knowledge of disease or condition will improve Outcome: Progressing Goal: Understanding of medication regimen will improve Outcome: Progressing Goal: Individualized Educational Video(s) Outcome: Progressing   Problem: Activity: Goal: Ability to tolerate increased activity will improve Outcome: Progressing   Problem: Cardiac: Goal: Ability to achieve and maintain adequate cardiopulmonary perfusion will improve Outcome: Progressing   Problem: Health Behavior/Discharge Planning: Goal: Ability to safely manage health-related needs after discharge will improve Outcome: Progressing   Problem: Education: Goal: Knowledge of General Education information will improve Description: Including pain rating scale, medication(s)/side effects and non-pharmacologic comfort measures Outcome: Progressing   Problem: Health Behavior/Discharge Planning: Goal: Ability to manage health-related needs will improve Outcome: Progressing   Problem: Clinical Measurements: Goal: Ability to maintain clinical measurements within normal limits will improve Outcome: Progressing Goal: Will remain free from infection Outcome: Progressing Goal: Diagnostic test results will improve Outcome: Progressing Goal: Respiratory complications will improve Outcome: Progressing Goal: Cardiovascular complication will be avoided Outcome: Progressing   Problem: Activity: Goal: Risk for activity  intolerance will decrease Outcome: Progressing   Problem: Nutrition: Goal: Adequate nutrition will be maintained Outcome: Progressing   Problem: Coping: Goal: Level of anxiety will decrease Outcome: Progressing   Problem: Elimination: Goal: Will not experience complications related to bowel motility Outcome: Progressing Goal: Will not experience complications related to urinary retention Outcome: Progressing   Problem: Pain Managment: Goal: General experience of comfort will improve Outcome: Progressing   Problem: Safety: Goal: Ability to remain free from injury will improve Outcome: Progressing   Problem: Skin Integrity: Goal: Risk for impaired skin integrity will decrease Outcome: Progressing   Problem: Education: Goal: Understanding of CV disease, CV risk reduction, and recovery process will improve Outcome: Progressing Goal: Individualized Educational Video(s) Outcome: Progressing   Problem: Activity: Goal: Ability to return to baseline activity level will improve Outcome: Progressing   Problem: Cardiovascular: Goal: Ability to achieve and maintain adequate cardiovascular perfusion will improve Outcome: Progressing Goal: Vascular access site(s) Level 0-1 will be maintained Outcome: Progressing   Problem: Health Behavior/Discharge Planning: Goal: Ability to safely manage health-related needs after discharge will improve Outcome: Progressing

## 2022-09-22 NOTE — Discharge Instructions (Signed)

## 2022-09-22 NOTE — H&P (View-Only) (Signed)
Progress Note  Patient Name: Garrett Carroll MRN: [id number] DOB: 04/30/1955 Date of Encounter: 09/22/2022  Attending physician: Joseph, Preetha, MD Primary care provider: Hill, Gerald, MD Primary Cardiologist: Dnyla Antonetti, DO, FACC  Subjective: Garrett Carroll is a 66 y.o. African-American male who was seen and examined at bedside  Shortness of breath and lower extremity swelling has significantly improved. Currently NPO. Scheduled for TEE guided cardioversion. Case discussed and reviewed with his nurse.  Objective: Vital Signs in the last 24 hours: Temp:  [98.6 F (37 C)-100 F (37.8 C)] 98.6 F (37 C) (06/21 0745) Pulse Rate:  [25-176] 92 (06/21 0745) Resp:  [15-18] 15 (06/21 0745) BP: (69-169)/(54-144) 99/71 (06/21 0745) SpO2:  [68 %-100 %] 93 % (06/21 0745) Weight:  [80 kg] 80 kg (06/21 0340)  Intake/Output:  Intake/Output Summary (Last 24 hours) at 09/22/2022 0819 Last data filed at 09/21/2022 2345 Gross per 24 hour  Intake --  Output 2530 ml  Net -2530 ml    Net IO Since Admission: -5,537.1 mL [09/22/22 0819]  Weights:     09/22/2022    3:40 AM 09/21/2022    4:31 AM 09/20/2022    4:10 AM  Last 3 Weights  Weight (lbs) 176 lb 4.8 oz 183 lb 8 oz 198 lb  Weight (kg) 79.969 kg 83.235 kg 89.812 kg      Telemetry:  Overnight telemetry shows atrial fibrillation with controlled ventricular rate and ventricular ectopy, which I personally reviewed.   Physical examination: PHYSICAL EXAM: Vitals:   09/22/22 0003 09/22/22 0019 09/22/22 0340 09/22/22 0745  BP: (!) 80/54  94/78 99/71  Pulse:  (!) 109 (!) 105 92  Resp:   16 15  Temp:   98.9 F (37.2 C) 98.6 F (37 C)  TempSrc:   Oral Oral  SpO2:  (!) 88% 97% 93%  Weight:   80 kg   Height:        Physical Exam  Constitutional: No distress.  Appears older than stated age, no acute distress, hemodynamically stable.  Neck: No JVD present.  Cardiovascular: Normal rate, S1 normal, S2 normal, intact distal pulses and  normal pulses. An irregularly irregular rhythm present. Exam reveals no gallop, no S3 and no S4.  Murmur heard. Holosystolic murmur is present with a grade of 3/6 at the apex. Pulmonary/Chest: No stridor. He has no wheezes. He has no rales.  Decreased breath sounds bilaterally.  Abdominal: Soft. Bowel sounds are normal. He exhibits no distension. There is no abdominal tenderness.  Musculoskeletal:        General: Edema (Bilateral trace) present.     Cervical back: Neck supple.     Comments: Warm to touch bilaterally.  Neurological: He is alert and oriented to person, place, and time. He has intact cranial nerves (2-12).  Skin: Skin is warm and moist.    Lab Results: Chemistry Recent Labs  Lab 09/19/22 1548 09/20/22 0040 09/21/22 0222 09/21/22 0801 09/21/22 0806 09/21/22 0811 09/22/22 0248  NA  --  138 140   < > 141 141 136  K  --  3.6 4.0   < > 3.7 3.6 3.9  CL  --  108 106  --   --   --  103  CO2  --  19* 24  --   --   --  24  GLUCOSE  --  113* 94  --   --   --  94  BUN  --  11 12  --   --   --    12  CREATININE  --  1.17 1.37*  --   --   --  1.36*  CALCIUM  --  8.4* 8.6*  --   --   --  8.3*  PROT 5.9* 5.6*  --   --   --   --   --   ALBUMIN 3.3* 3.1*  --   --   --   --   --   AST 25 21  --   --   --   --   --   ALT 34 30  --   --   --   --   --   ALKPHOS 57 52  --   --   --   --   --   BILITOT 3.5* 2.7*  --   --   --   --   --   GFRNONAA  --  >60 57*  --   --   --  58*  ANIONGAP  --  11 10  --   --   --  9   < > = values in this interval not displayed.    Hematology Recent Labs  Lab 09/20/22 0040 09/21/22 0222 09/21/22 0801 09/21/22 0806 09/21/22 0811 09/22/22 0248  WBC 5.9 5.2  --   --   --  4.2  RBC 4.33 4.18*  --   --   --  4.06*  HGB 13.3 12.6*   < > 12.6* 12.6* 12.8*  HCT 40.0 38.3*   < > 37.0* 37.0* 37.9*  MCV 92.4 91.6  --   --   --  93.3  MCH 30.7 30.1  --   --   --  31.5  MCHC 33.3 32.9  --   --   --  33.8  RDW 15.7* 15.6*  --   --   --  15.2  PLT  103* 100*  --   --   --  108*   < > = values in this interval not displayed.   High Sensitivity Troponin:   Recent Labs  Lab 09/19/22 1548 09/19/22 1624  TROPONINIHS 15 12     Cardiac EnzymesNo results for input(s): "TROPONINI" in the last 168 hours. No results for input(s): "TROPIPOC" in the last 168 hours.  BNP Recent Labs  Lab 09/19/22 1545  BNP 1,138.8*    DDimer  Recent Labs  Lab 09/19/22 1548  DDIMER 1.46*    Hemoglobin A1c:  Lab Results  Component Value Date   HGBA1C 5.8 (H) 06/12/2022   MPG 120 06/12/2022   TSH  Recent Labs    06/11/22 1524 09/20/22 0040  TSH 2.376 16.790*   Lipid Panel  Lab Results  Component Value Date   CHOL 121 06/12/2022   HDL 54 06/12/2022   LDLCALC 61 06/12/2022   LDLDIRECT 62 06/12/2022   TRIG 32 06/12/2022   CHOLHDL 2.2 06/12/2022   Drugs of Abuse     Component Value Date/Time   LABOPIA NONE DETECTED 09/19/2022 2019   COCAINSCRNUR NONE DETECTED 09/19/2022 2019   LABBENZ NONE DETECTED 09/19/2022 2019   AMPHETMU NONE DETECTED 09/19/2022 2019   THCU NONE DETECTED 09/19/2022 2019   LABBARB NONE DETECTED 09/19/2022 2019      Imaging: CARDIAC CATHETERIZATION  Result Date: 09/21/2022 LM: Normal LAD: Normal RI: Normal Lcx: Normal RCA: Normal RA: 5 mmHg RV: 32/5 mmHg PA: 27/4 mmHg, mPAP 22 mmHg PCW: Could not be obtained as catheter could not reach the wedge position (Patient is 6   ft 9 in). LV 82/4 mmHg LVEDP 8 mmHg (Patient was given 500 cc saline bolus) No coronary artery disease Compensated nonischemic cardiomyopathy Borderline pulmonary hypertension, WHO grp II Continue GDMT for heart failure and sinus rhythm restoration Okay to resume OAC this afternoon Manish J Patwardhan, MD Pager: [phone] Office: [phone]  ECHOCARDIOGRAM LIMITED  Result Date: 09/20/2022    ECHOCARDIOGRAM LIMITED REPORT   Patient Name:   Garrett Carroll Date of Exam: 09/20/2022 Medical Rec #:  [id number]      Height:       81.0 in Accession #:     [id number]     Weight:       198.0 lb Date of Birth:  07/26/1955      BSA:          2.310 m Patient Age:    66 years       BP:           89/69 mmHg Patient Gender: M              HR:           102 bpm. Exam Location:  Inpatient Procedure: Limited Echo, Cardiac Doppler and Color Doppler Indications:     CHF  History:         Patient has prior history of Echocardiogram examinations, most                  recent 06/12/2022. CHF and Cardiomyopathy, Mitral Valve Disease;                  Arrythmias:Atrial Fibrillation.  Sonographer:     Samuel Clark Referring Phys:  [id number] Arva Slaugh Diagnosing Phys: Leopold Smyers DO IMPRESSIONS  1. LIMITED ECHO - for more details refer to full echo from June 12, 2022.  2. Left ventricular ejection fraction, by estimation, is <20%. The left ventricle has severely decreased function. The left ventricle demonstrates global hypokinesis. The left ventricular internal cavity size was dilated. There is mild left ventricular hypertrophy. Left ventricular diastolic function could not be evaluated.  3. RVSP is underestimated due to dilated RV and RA chambers. . Right ventricular systolic function is severely reduced. The right ventricular size is severely enlarged. There is mildly elevated pulmonary artery systolic pressure. The estimated right ventricular systolic pressure is 40.0 mmHg.  4. Left atrial size was mildly dilated.  5. Right atrial size was severely dilated.  6. The mitral valve is degenerative. Severe mitral valve regurgitation. No evidence of mitral stenosis.  7. Tricuspid valve regurgitation is severe.  8. The aortic valve is grossly normal. Aortic valve regurgitation is not visualized. No aortic stenosis is present.  9. Aortic proximal ascending aorta at upper limit of normal (37mm). There is borderline dilatation of the aortic root, measuring 38 mm. 10. Dilated pulmonary artery. 11. The inferior vena cava is dilated in size with <50% respiratory variability, suggesting right  atrial pressure of 15 mmHg. 12. Rhythm strip during this exam demonstrates atrial fibrillation. Comparison(s): A prior study was performed on 06/12/2022. No significant change. Conclusion(s)/Recommendation(s): No left ventricular mural or apical thrombus/thrombi. FINDINGS  Left Ventricle: Left ventricular ejection fraction, by estimation, is <20%. The left ventricle has severely decreased function. The left ventricle demonstrates global hypokinesis. Definity contrast agent was given IV to delineate the left ventricular endocardial borders. The left ventricular internal cavity size was dilated. There is mild left ventricular hypertrophy. Left ventricular diastolic function could not be evaluated. Left ventricular diastolic function could not be evaluated   due to atrial fibrillation. Right Ventricle: RVSP is underestimated due to dilated RV and RA chambers. The right ventricular size is severely enlarged. Right ventricular systolic function is severely reduced. There is mildly elevated pulmonary artery systolic pressure. The tricuspid regurgitant velocity is 2.50 m/s, and with an assumed right atrial pressure of 15 mmHg, the estimated right ventricular systolic pressure is 40.0 mmHg. Left Atrium: Left atrial size was mildly dilated. Right Atrium: Right atrial size was severely dilated. Pericardium: There is no evidence of pericardial effusion. Mitral Valve: The mitral valve is degenerative in appearance. Severe mitral valve regurgitation, with centrally-directed jet. No evidence of mitral valve stenosis. Tricuspid Valve: The tricuspid valve is grossly normal. Tricuspid valve regurgitation is severe. No evidence of tricuspid stenosis. Aortic Valve: The aortic valve is grossly normal. Aortic valve regurgitation is not visualized. No aortic stenosis is present. Pulmonic Valve: The pulmonic valve was grossly normal. Aorta: Proximal ascending aorta at upper limit of normal (37mm). There is borderline dilatation of the aortic  root, measuring 38 mm. Pulmonary Artery: The pulmonary artery is dilated. Venous: The inferior vena cava is dilated in size with less than 50% respiratory variability, suggesting right atrial pressure of 15 mmHg. EKG: Rhythm strip during this exam demonstrates atrial fibrillation. LEFT VENTRICLE PLAX 2D LVIDd:         7.00 cm LVIDs:         6.40 cm LV PW:         1.30 cm LV IVS:        1.10 cm LVOT diam:     2.00 cm LVOT Area:     3.14 cm  IVC IVC diam: 2.80 cm LEFT ATRIUM           Index        RIGHT ATRIUM           Index LA Vol (A2C): 62.9 ml 27.23 ml/m  RA Area:     34.40 cm LA Vol (A4C): 71.0 ml 30.73 ml/m  RA Volume:   140.00 ml 60.60 ml/m   AORTA Ao Root diam: 3.80 cm Ao Asc diam:  3.70 cm MR Peak grad:    47.3 mmHg    TRICUSPID VALVE MR Mean grad:    27.0 mmHg    TR Peak grad:   25.0 mmHg MR Vmax:         344.00 cm/s  TR Vmax:        250.00 cm/s MR Vmean:        249.0 cm/s MR PISA:         6.28 cm     SHUNTS MR PISA Eff ROA: 109 mm      Systemic Diam: 2.00 cm MR PISA Radius:  1.00 cm Garrett Turgeon DO Electronically signed by Roxanne Orner DO Signature Date/Time: 09/20/2022/9:08:13 PM    Final     CARDIAC DATABASE: EKG: 06/19/2022: Atrial fibrillation, 100 bpm, LVH with voltage, PVCs, prolonged QT.    Echocardiogram: 06/12/2022:   1. Left ventricular ejection fraction, by estimation, is 20 to 25%. The left ventricle has severely decreased function. The left ventricle demonstrates global hypokinesis. The left ventricular internal cavity size was moderately dilated. Left  ventricular diastolic function could not be evaluated.  2. Right ventricular systolic function is severely reduced. The right ventricular size is severely enlarged.  3. Left atrial size was severely dilated.  4. Right atrial size was severely dilated.  5. The mitral valve is normal in structure. Severe mitral valve regurgitation. No evidence   of mitral stenosis.  6. Tricuspid valve regurgitation is severe.  7. The aortic valve  is normal in structure. Aortic valve regurgitation is not visualized. No aortic stenosis is present.  8. Aortic dilatation noted. There is borderline dilatation of the aortic root, measuring 39 mm. There is borderline dilatation of the ascending aorta, measuring 38 mm.  9. The inferior vena cava is dilated in size with <50% respiratory variability, suggesting right atrial pressure of 15 mmHg.  09/20/2022 Limited Echo:  1. LIMITED ECHO - for more details refer to full echo from June 12, 2022.   2. Left ventricular ejection fraction, by estimation, is <20%. The left  ventricle has severely decreased function. The left ventricle demonstrates  global hypokinesis. The left ventricular internal cavity size was dilated.  There is mild left ventricular  hypertrophy. Left ventricular diastolic function could not be evaluated.   3. RVSP is underestimated due to dilated RV and RA chambers. . Right  ventricular systolic function is severely reduced. The right ventricular  size is severely enlarged. There is mildly elevated pulmonary artery  systolic pressure. The estimated right  ventricular systolic pressure is 40.0 mmHg.   4. Left atrial size was mildly dilated.   5. Right atrial size was severely dilated.   6. The mitral valve is degenerative. Severe mitral valve regurgitation.  No evidence of mitral stenosis.   7. Tricuspid valve regurgitation is severe.   8. The aortic valve is grossly normal. Aortic valve regurgitation is not  visualized. No aortic stenosis is present.   9. Aortic proximal ascending aorta at upper limit of normal (37mm). There  is borderline dilatation of the aortic root, measuring 38 mm.  10. Dilated pulmonary artery.  11. The inferior vena cava is dilated in size with <50% respiratory  variability, suggesting right atrial pressure of 15 mmHg.  12. Rhythm strip during this exam demonstrates atrial fibrillation.   Heart catheterization: Left/right heart catheterization  with coronary angiography: 09/21/2022 LM: Normal LAD: Normal RI: Normal Lcx: Normal RCA: Normal   RA: 5 mmHg RV: 32/5 mmHg PA: 27/4 mmHg, mPAP 22 mmHg PCW: Could not be obtained as catheter could not reach the wedge position (Patient is 6 ft 9 in).   LV 82/4 mmHg LVEDP 8 mmHg (Patient was given 500 cc saline bolus)   No coronary artery disease Compensated nonischemic cardiomyopathy Borderline pulmonary hypertension, WHO grp II Continue GDMT for heart failure and sinus rhythm restoration   Scheduled Meds:  amiodarone  200 mg Oral BID   apixaban  5 mg Oral BID   dapagliflozin propanediol  10 mg Oral Daily   midodrine  5 mg Oral TID WC   sodium chloride flush  3 mL Intravenous Q12H   sodium chloride flush  3 mL Intravenous Q12H    Continuous Infusions:  sodium chloride      PRN Meds: sodium chloride, acetaminophen **OR** acetaminophen, ondansetron **OR** ondansetron (ZOFRAN) IV, mouth rinse, senna-docusate, sodium chloride flush   IMPRESSION & RECOMMENDATIONS: Garrett Carroll is a 66 y.o. African-American male whose past medical history and cardiac risk factors include: Nonischemic cardiomyopathy, HFrEF, symptomatic atrial fibrillation, lung nodule, noncompliance.  Impression: Chronic biventricular heart failure, stage C, NYHA class II/III. Nonischemic cardiomyopathy, compensated based on right heart catheterization. Persistent atrial fibrillation with controlled ventricular rate. Long-term anticoagulation. Long-term antiarrhythmic medications. Ascending thoracic aortic aneurysm 4.1 cm (June 2024). Hypothyroidism. Medication/follow-up noncompliance  Plan: Chronic biventricular heart failure, stage C, NYHA class II/III. Nonischemic cardiomyopathy Presented to the office earlier this   week with 20 pounds of weight gain, effort related symptoms, orthopnea, PND, lower extremity swelling. He was electively hospitalized for parenteral diuresis. Net IO Since Admission:  -5,537.1 mL [09/22/22 0826] with IV Lasix and Farxiga. Clinically he feels much better and hemodynamically right heart catheterization illustrates compensated nonischemic cardiomyopathy. With rising serum creatinine and softer blood pressures compared to baseline will d/c IV lasix.  Morning weights are back to his baseline.  Continue midodrine, Farxiga Further up titration of medical therapy has been limited due to soft blood pressures. Palliative care consult has been requested which is quite appropriate given his nonischemic cardiomyopathy and biventricular heart failure.  Discussions with regards to goals of care are imperative.  Spoke to his wife regarding this yesterday as well. Scheduled for TEE guided cardioversion later today.  Persistent atrial fibrillation with controlled ventricular rate: Long-term anticoagulation. Long-term antiarrhythmic medications. Rate control: N/A. Rhythm control: Amiodarone. Thromboembolic prophylaxis: Eliquis. Was not started on beta-blocker due to acute heart failure exacerbation and calcium channel blockers contraindicated due to low EF. He has been in atrial fibrillation since March 2024. Risks, benefits, alternatives, of TEE guided cardioversion discussed at length at multiple office visits and even during his hospitalization with patient and wife.  They both are agreeable with to proceed forward.  Scheduled for later today. Will titrate medications based on outcomes. Patient and wife understand that it is imperative that he takes anticoagulation after his cardioversion.  Wife stated "I will make sure and give it to him." They are aware of potential complications if they are not compliant w/ blood thinners post cardioversion.   Ascending thoracic aortic aneurysm: 41 mm 08 September 2022. Yearly follow-up recommended.  Elevated D-dimers: Has undergone CT PE protocol study as well as lower extremity venous duplex both were unremarkable for  VTE.  Hypothyroidism: Currently managed by primary team.  Likely anticipate discharge in the next 24 to 48 hours based on his clinical trajectory.  Recommend sending medications to [facility] pharmacy.   Patient's questions and concerns were addressed to his satisfaction. He voices understanding of the instructions provided during this encounter.   This note was created using a voice recognition software as a result there may be grammatical errors inadvertently enclosed that do not reflect the nature of this encounter. Every attempt is made to correct such errors.  Garrett Platts, DO, FACC  Pager:  [phone] Office: [phone] 09/22/2022, 8:19 AM      

## 2022-09-22 NOTE — Interval H&P Note (Signed)
History and Physical Interval Note:  09/22/2022 2:00 PM  Garrett Carroll  has presented today for surgery, with the diagnosis of afib.  The various methods of treatment have been discussed with the patient and family. After consideration of risks, benefits and other options for treatment, the patient has consented to  Procedure(s): TRANSESOPHAGEAL ECHOCARDIOGRAM (N/A) CARDIOVERSION (N/A) as a surgical intervention.  The patient's history has been reviewed, patient examined, no change in status, stable for surgery.  I have reviewed the patient's chart and labs.  Questions were answered to the patient's satisfaction.     Yates Decamp

## 2022-09-22 NOTE — Progress Notes (Signed)
Progress Note  Patient Name: Garrett Carroll MRN: [id number] DOB: 02-26-1956 Date of Encounter: 09/22/2022  Attending physician: Zannie Cove, MD Primary care provider: Mirna Mires, MD Primary Cardiologist: Tessa Lerner, DO, [facility]  Subjective: Garrett Carroll is a 67 y.o. African-American male who was seen and examined at bedside  Shortness of breath and lower extremity swelling has significantly improved. Currently NPO. Scheduled for TEE guided cardioversion. Case discussed and reviewed with his nurse.  Objective: Vital Signs in the last 24 hours: Temp:  [98.6 F (37 C)-100 F (37.8 C)] 98.6 F (37 C) (06/21 0745) Pulse Rate:  [25-176] 92 (06/21 0745) Resp:  [15-18] 15 (06/21 0745) BP: (69-169)/(54-144) 99/71 (06/21 0745) SpO2:  [68 %-100 %] 93 % (06/21 0745) Weight:  [80 kg] 80 kg (06/21 0340)  Intake/Output:  Intake/Output Summary (Last 24 hours) at 09/22/2022 0819 Last data filed at 09/21/2022 2345 Gross per 24 hour  Intake --  Output 2530 ml  Net -2530 ml    Net IO Since Admission: -5,537.1 mL [09/22/22 0819]  Weights:     09/22/2022    3:40 AM 09/21/2022    4:31 AM 09/20/2022    4:10 AM  Last 3 Weights  Weight (lbs) 176 lb 4.8 oz 183 lb 8 oz 198 lb  Weight (kg) 79.969 kg 83.235 kg 89.812 kg      Telemetry:  Overnight telemetry shows atrial fibrillation with controlled ventricular rate and ventricular ectopy, which I personally reviewed.   Physical examination: PHYSICAL EXAM: Vitals:   09/22/22 0003 09/22/22 0019 09/22/22 0340 09/22/22 0745  BP: (!) 80/54  94/78 99/71  Pulse:  (!) 109 (!) 105 92  Resp:   16 15  Temp:   98.9 F (37.2 C) 98.6 F (37 C)  TempSrc:   Oral Oral  SpO2:  (!) 88% 97% 93%  Weight:   80 kg   Height:        Physical Exam  Constitutional: No distress.  Appears older than stated age, no acute distress, hemodynamically stable.  Neck: No JVD present.  Cardiovascular: Normal rate, S1 normal, S2 normal, intact distal pulses and  normal pulses. An irregularly irregular rhythm present. Exam reveals no gallop, no S3 and no S4.  Murmur heard. Holosystolic murmur is present with a grade of 3/6 at the apex. Pulmonary/Chest: No stridor. He has no wheezes. He has no rales.  Decreased breath sounds bilaterally.  Abdominal: Soft. Bowel sounds are normal. He exhibits no distension. There is no abdominal tenderness.  Musculoskeletal:        General: Edema (Bilateral trace) present.     Cervical back: Neck supple.     Comments: Warm to touch bilaterally.  Neurological: He is alert and oriented to person, place, and time. He has intact cranial nerves (2-12).  Skin: Skin is warm and moist.    Lab Results: Chemistry Recent Labs  Lab 09/19/22 1548 09/20/22 0040 09/21/22 0222 09/21/22 0801 09/21/22 0806 09/21/22 0811 09/22/22 0248  NA  --  138 140   < > 141 141 136  K  --  3.6 4.0   < > 3.7 3.6 3.9  CL  --  108 106  --   --   --  103  CO2  --  19* 24  --   --   --  24  GLUCOSE  --  113* 94  --   --   --  94  BUN  --  11 12  --   --   --  12  CREATININE  --  1.17 1.37*  --   --   --  1.36*  CALCIUM  --  8.4* 8.6*  --   --   --  8.3*  PROT 5.9* 5.6*  --   --   --   --   --   ALBUMIN 3.3* 3.1*  --   --   --   --   --   AST 25 21  --   --   --   --   --   ALT 34 30  --   --   --   --   --   ALKPHOS 57 52  --   --   --   --   --   BILITOT 3.5* 2.7*  --   --   --   --   --   GFRNONAA  --  >60 57*  --   --   --  58*  ANIONGAP  --  11 10  --   --   --  9   < > = values in this interval not displayed.    Hematology Recent Labs  Lab 09/20/22 0040 09/21/22 0222 09/21/22 0801 09/21/22 0806 09/21/22 0811 09/22/22 0248  WBC 5.9 5.2  --   --   --  4.2  RBC 4.33 4.18*  --   --   --  4.06*  HGB 13.3 12.6*   < > 12.6* 12.6* 12.8*  HCT 40.0 38.3*   < > 37.0* 37.0* 37.9*  MCV 92.4 91.6  --   --   --  93.3  MCH 30.7 30.1  --   --   --  31.5  MCHC 33.3 32.9  --   --   --  33.8  RDW 15.7* 15.6*  --   --   --  15.2  PLT  103* 100*  --   --   --  108*   < > = values in this interval not displayed.   High Sensitivity Troponin:   Recent Labs  Lab 09/19/22 1548 09/19/22 1624  TROPONINIHS 15 12     Cardiac EnzymesNo results for input(s): "TROPONINI" in the last 168 hours. No results for input(s): "TROPIPOC" in the last 168 hours.  BNP Recent Labs  Lab 09/19/22 1545  BNP 1,138.8*    DDimer  Recent Labs  Lab 09/19/22 1548  DDIMER 1.46*    Hemoglobin A1c:  Lab Results  Component Value Date   HGBA1C 5.8 (H) 06/12/2022   MPG 120 06/12/2022   TSH  Recent Labs    06/11/22 1524 09/20/22 0040  TSH 2.376 16.790*   Lipid Panel  Lab Results  Component Value Date   CHOL 121 06/12/2022   HDL 54 06/12/2022   LDLCALC 61 06/12/2022   LDLDIRECT 62 06/12/2022   TRIG 32 06/12/2022   CHOLHDL 2.2 06/12/2022   Drugs of Abuse     Component Value Date/Time   LABOPIA NONE DETECTED 09/19/2022 2019   COCAINSCRNUR NONE DETECTED 09/19/2022 2019   LABBENZ NONE DETECTED 09/19/2022 2019   AMPHETMU NONE DETECTED 09/19/2022 2019   THCU NONE DETECTED 09/19/2022 2019   LABBARB NONE DETECTED 09/19/2022 2019      Imaging: CARDIAC CATHETERIZATION  Result Date: 09/21/2022 LM: Normal LAD: Normal RI: Normal Lcx: Normal RCA: Normal RA: 5 mmHg RV: 32/5 mmHg PA: 27/4 mmHg, mPAP 22 mmHg PCW: Could not be obtained as catheter could not reach the wedge position (Patient is 6  ft 9 in). LV 82/4 mmHg LVEDP 8 mmHg (Patient was given 500 cc saline bolus) No coronary artery disease Compensated nonischemic cardiomyopathy Borderline pulmonary hypertension, WHO grp II Continue GDMT for heart failure and sinus rhythm restoration Okay to resume OAC this afternoon Elder Negus, MD Pager: [phone] Office: [phone]  ECHOCARDIOGRAM LIMITED  Result Date: 09/20/2022    ECHOCARDIOGRAM LIMITED REPORT   Patient Name:   Garrett Carroll Date of Exam: 09/20/2022 Medical Rec #:  [id number]      Height:       81.0 in Accession #:     [id number]     Weight:       198.0 lb Date of Birth:  [birth year]      BSA:          2.310 m Patient Age:    66 years       BP:           89/69 mmHg Patient Gender: M              HR:           102 bpm. Exam Location:  Inpatient Procedure: Limited Echo, Cardiac Doppler and Color Doppler Indications:     CHF  History:         Patient has prior history of Echocardiogram examinations, most                  recent 06/12/2022. CHF and Cardiomyopathy, Mitral Valve Disease;                  Arrythmias:Atrial Fibrillation.  Sonographer:     Darlys Gales Referring Phys:  [id number] Bevin Das Diagnosing Phys: Tessa Lerner DO IMPRESSIONS  1. LIMITED ECHO - for more details refer to full echo from June 12, 2022.  2. Left ventricular ejection fraction, by estimation, is <20%. The left ventricle has severely decreased function. The left ventricle demonstrates global hypokinesis. The left ventricular internal cavity size was dilated. There is mild left ventricular hypertrophy. Left ventricular diastolic function could not be evaluated.  3. RVSP is underestimated due to dilated RV and RA chambers. . Right ventricular systolic function is severely reduced. The right ventricular size is severely enlarged. There is mildly elevated pulmonary artery systolic pressure. The estimated right ventricular systolic pressure is 40.0 mmHg.  4. Left atrial size was mildly dilated.  5. Right atrial size was severely dilated.  6. The mitral valve is degenerative. Severe mitral valve regurgitation. No evidence of mitral stenosis.  7. Tricuspid valve regurgitation is severe.  8. The aortic valve is grossly normal. Aortic valve regurgitation is not visualized. No aortic stenosis is present.  9. Aortic proximal ascending aorta at upper limit of normal (37mm). There is borderline dilatation of the aortic root, measuring 38 mm. 10. Dilated pulmonary artery. 11. The inferior vena cava is dilated in size with <50% respiratory variability, suggesting right  atrial pressure of 15 mmHg. 12. Rhythm strip during this exam demonstrates atrial fibrillation. Comparison(s): A prior study was performed on 06/12/2022. No significant change. Conclusion(s)/Recommendation(s): No left ventricular mural or apical thrombus/thrombi. FINDINGS  Left Ventricle: Left ventricular ejection fraction, by estimation, is <20%. The left ventricle has severely decreased function. The left ventricle demonstrates global hypokinesis. Definity contrast agent was given IV to delineate the left ventricular endocardial borders. The left ventricular internal cavity size was dilated. There is mild left ventricular hypertrophy. Left ventricular diastolic function could not be evaluated. Left ventricular diastolic function could not be evaluated  due to atrial fibrillation. Right Ventricle: RVSP is underestimated due to dilated RV and RA chambers. The right ventricular size is severely enlarged. Right ventricular systolic function is severely reduced. There is mildly elevated pulmonary artery systolic pressure. The tricuspid regurgitant velocity is 2.50 m/s, and with an assumed right atrial pressure of 15 mmHg, the estimated right ventricular systolic pressure is 40.0 mmHg. Left Atrium: Left atrial size was mildly dilated. Right Atrium: Right atrial size was severely dilated. Pericardium: There is no evidence of pericardial effusion. Mitral Valve: The mitral valve is degenerative in appearance. Severe mitral valve regurgitation, with centrally-directed jet. No evidence of mitral valve stenosis. Tricuspid Valve: The tricuspid valve is grossly normal. Tricuspid valve regurgitation is severe. No evidence of tricuspid stenosis. Aortic Valve: The aortic valve is grossly normal. Aortic valve regurgitation is not visualized. No aortic stenosis is present. Pulmonic Valve: The pulmonic valve was grossly normal. Aorta: Proximal ascending aorta at upper limit of normal (37mm). There is borderline dilatation of the aortic  root, measuring 38 mm. Pulmonary Artery: The pulmonary artery is dilated. Venous: The inferior vena cava is dilated in size with less than 50% respiratory variability, suggesting right atrial pressure of 15 mmHg. EKG: Rhythm strip during this exam demonstrates atrial fibrillation. LEFT VENTRICLE PLAX 2D LVIDd:         7.00 cm LVIDs:         6.40 cm LV PW:         1.30 cm LV IVS:        1.10 cm LVOT diam:     2.00 cm LVOT Area:     3.14 cm  IVC IVC diam: 2.80 cm LEFT ATRIUM           Index        RIGHT ATRIUM           Index LA Vol (A2C): 62.9 ml 27.23 ml/m  RA Area:     34.40 cm LA Vol (A4C): 71.0 ml 30.73 ml/m  RA Volume:   140.00 ml 60.60 ml/m   AORTA Ao Root diam: 3.80 cm Ao Asc diam:  3.70 cm MR Peak grad:    47.3 mmHg    TRICUSPID VALVE MR Mean grad:    27.0 mmHg    TR Peak grad:   25.0 mmHg MR Vmax:         344.00 cm/s  TR Vmax:        250.00 cm/s MR Vmean:        249.0 cm/s MR PISA:         6.28 cm     SHUNTS MR PISA Eff ROA: 109 mm      Systemic Diam: 2.00 cm MR PISA Radius:  1.00 cm Mackena Plummer DO Electronically signed by Tessa Lerner DO Signature Date/Time: 09/20/2022/9:08:13 PM    Final     CARDIAC DATABASE: EKG: 06/19/2022: Atrial fibrillation, 100 bpm, LVH with voltage, PVCs, prolonged QT.    Echocardiogram: 06/12/2022:   1. Left ventricular ejection fraction, by estimation, is 20 to 25%. The left ventricle has severely decreased function. The left ventricle demonstrates global hypokinesis. The left ventricular internal cavity size was moderately dilated. Left  ventricular diastolic function could not be evaluated.  2. Right ventricular systolic function is severely reduced. The right ventricular size is severely enlarged.  3. Left atrial size was severely dilated.  4. Right atrial size was severely dilated.  5. The mitral valve is normal in structure. Severe mitral valve regurgitation. No evidence  of mitral stenosis.  6. Tricuspid valve regurgitation is severe.  7. The aortic valve  is normal in structure. Aortic valve regurgitation is not visualized. No aortic stenosis is present.  8. Aortic dilatation noted. There is borderline dilatation of the aortic root, measuring 39 mm. There is borderline dilatation of the ascending aorta, measuring 38 mm.  9. The inferior vena cava is dilated in size with <50% respiratory variability, suggesting right atrial pressure of 15 mmHg.  09/20/2022 Limited Echo:  1. LIMITED ECHO - for more details refer to full echo from June 12, 2022.   2. Left ventricular ejection fraction, by estimation, is <20%. The left  ventricle has severely decreased function. The left ventricle demonstrates  global hypokinesis. The left ventricular internal cavity size was dilated.  There is mild left ventricular  hypertrophy. Left ventricular diastolic function could not be evaluated.   3. RVSP is underestimated due to dilated RV and RA chambers. . Right  ventricular systolic function is severely reduced. The right ventricular  size is severely enlarged. There is mildly elevated pulmonary artery  systolic pressure. The estimated right  ventricular systolic pressure is 40.0 mmHg.   4. Left atrial size was mildly dilated.   5. Right atrial size was severely dilated.   6. The mitral valve is degenerative. Severe mitral valve regurgitation.  No evidence of mitral stenosis.   7. Tricuspid valve regurgitation is severe.   8. The aortic valve is grossly normal. Aortic valve regurgitation is not  visualized. No aortic stenosis is present.   9. Aortic proximal ascending aorta at upper limit of normal (37mm). There  is borderline dilatation of the aortic root, measuring 38 mm.  10. Dilated pulmonary artery.  11. The inferior vena cava is dilated in size with <50% respiratory  variability, suggesting right atrial pressure of 15 mmHg.  12. Rhythm strip during this exam demonstrates atrial fibrillation.   Heart catheterization: Left/right heart catheterization  with coronary angiography: 09/21/2022 LM: Normal LAD: Normal RI: Normal Lcx: Normal RCA: Normal   RA: 5 mmHg RV: 32/5 mmHg PA: 27/4 mmHg, mPAP 22 mmHg PCW: Could not be obtained as catheter could not reach the wedge position (Patient is 6 ft 9 in).   LV 82/4 mmHg LVEDP 8 mmHg (Patient was given 500 cc saline bolus)   No coronary artery disease Compensated nonischemic cardiomyopathy Borderline pulmonary hypertension, WHO grp II Continue GDMT for heart failure and sinus rhythm restoration   Scheduled Meds:  amiodarone  200 mg Oral BID   apixaban  5 mg Oral BID   dapagliflozin propanediol  10 mg Oral Daily   midodrine  5 mg Oral TID WC   sodium chloride flush  3 mL Intravenous Q12H   sodium chloride flush  3 mL Intravenous Q12H    Continuous Infusions:  sodium chloride      PRN Meds: sodium chloride, acetaminophen **OR** acetaminophen, ondansetron **OR** ondansetron (ZOFRAN) IV, mouth rinse, senna-docusate, sodium chloride flush   IMPRESSION & RECOMMENDATIONS: Garrett Carroll is a 67 y.o. African-American male whose past medical history and cardiac risk factors include: Nonischemic cardiomyopathy, HFrEF, symptomatic atrial fibrillation, lung nodule, noncompliance.  Impression: Chronic biventricular heart failure, stage C, NYHA class II/III. Nonischemic cardiomyopathy, compensated based on right heart catheterization. Persistent atrial fibrillation with controlled ventricular rate. Long-term anticoagulation. Long-term antiarrhythmic medications. Ascending thoracic aortic aneurysm 4.1 cm (June 2024). Hypothyroidism. Medication/follow-up noncompliance  Plan: Chronic biventricular heart failure, stage C, NYHA class II/III. Nonischemic cardiomyopathy Presented to the office earlier this  week with 20 pounds of weight gain, effort related symptoms, orthopnea, PND, lower extremity swelling. He was electively hospitalized for parenteral diuresis. Net IO Since Admission:  -5,537.1 mL [09/22/22 0826] with IV Lasix and Farxiga. Clinically he feels much better and hemodynamically right heart catheterization illustrates compensated nonischemic cardiomyopathy. With rising serum creatinine and softer blood pressures compared to baseline will d/c IV lasix.  Morning weights are back to his baseline.  Continue midodrine, Marcelline Deist Further up titration of medical therapy has been limited due to soft blood pressures. Palliative care consult has been requested which is quite appropriate given his nonischemic cardiomyopathy and biventricular heart failure.  Discussions with regards to goals of care are imperative.  Spoke to his wife regarding this yesterday as well. Scheduled for TEE guided cardioversion later today.  Persistent atrial fibrillation with controlled ventricular rate: Long-term anticoagulation. Long-term antiarrhythmic medications. Rate control: N/A. Rhythm control: Amiodarone. Thromboembolic prophylaxis: Eliquis. Was not started on beta-blocker due to acute heart failure exacerbation and calcium channel blockers contraindicated due to low EF. He has been in atrial fibrillation since March 2024. Risks, benefits, alternatives, of TEE guided cardioversion discussed at length at multiple office visits and even during his hospitalization with patient and wife.  They both are agreeable with to proceed forward.  Scheduled for later today. Will titrate medications based on outcomes. Patient and wife understand that it is imperative that he takes anticoagulation after his cardioversion.  Wife stated "I will make sure and give it to him." They are aware of potential complications if they are not compliant w/ blood thinners post cardioversion.   Ascending thoracic aortic aneurysm: 41 mm 08 September 2022. Yearly follow-up recommended.  Elevated D-dimers: Has undergone CT PE protocol study as well as lower extremity venous duplex both were unremarkable for  VTE.  Hypothyroidism: Currently managed by primary team.  Likely anticipate discharge in the next 24 to 48 hours based on his clinical trajectory.  Recommend sending medications to [facility] pharmacy.   Patient's questions and concerns were addressed to his satisfaction. He voices understanding of the instructions provided during this encounter.   This note was created using a voice recognition software as a result there may be grammatical errors inadvertently enclosed that do not reflect the nature of this encounter. Every attempt is made to correct such errors.  Delilah Shan [facility]  Pager:  [phone] Office: [phone] 09/22/2022, 8:19 AM

## 2022-09-22 NOTE — Progress Notes (Signed)
  Echocardiogram Echocardiogram Transesophageal has been performed.  Garrett Carroll 09/22/2022, 3:05 PM

## 2022-09-22 NOTE — Anesthesia Postprocedure Evaluation (Signed)
Anesthesia Post Note  Patient: Garrett Carroll  Procedure(s) Performed: TRANSESOPHAGEAL ECHOCARDIOGRAM CARDIOVERSION     Patient location during evaluation: Cath Lab Anesthesia Type: MAC Level of consciousness: awake and alert, patient cooperative and oriented Pain management: pain level controlled Vital Signs Assessment: post-procedure vital signs reviewed and stable Respiratory status: spontaneous breathing, nonlabored ventilation, respiratory function stable and patient connected to nasal cannula oxygen Cardiovascular status: stable and blood pressure returned to baseline Postop Assessment: no apparent nausea or vomiting Anesthetic complications: no   No notable events documented.  Last Vitals:  Vitals:   09/22/22 1450 09/22/22 1500  BP: 91/71 (!) 89/73  Pulse: 98 74  Resp: 19 17  Temp:    SpO2: 93% 95%    Last Pain:  Vitals:   09/22/22 1500  TempSrc:   PainSc: 0-No pain                 Saraih Lorton,E. Raylyn Speckman

## 2022-09-22 NOTE — CV Procedure (Signed)
Procedure performed:  TEE guided cardioversion.  Indication acute on chronic systolic heart failure + A. Fib with RVR  TEE: Under deep sedation, TEE was performed without complications:  LV: Mildly dilated. EF 10-15% with global hypokinesis.   RV: Mildly dilated with Moderate to severe decrease in RV systolic function. Mild pulmonary hypertension with PASP estimated 42 mm Hg LA: Dilated. Left atrial appendage:  Decreased function.  Mild smoke noted in the LA and LAA without thrombusInter atrial septum is intact without defect. RA: Dilated  MV: Normal mild  MR. TV: Normal mild to moderate TR AV: Normal. No AI or AS. PV: Normal. Trace PI.  Thoracic and ascending aorta: Normal with very mild  atheromatous changes.  Direct current cardioversion 09/22/2022 2:36 PM  Indication symptomatic A. Fibrillation.  Procedure: Using Deep sedation following TEE,  patient was delivered with 150 Joules of electricity X 1 with success to NSR. Patient tolerated the procedure well. No immediate complication noted.     Yates Decamp, MD, [facility] 09/22/2022, 2:36 PM Office: [phone] Fax: [phone] Pager: [phone]

## 2022-09-22 NOTE — Transfer of Care (Signed)
Immediate Anesthesia Transfer of Care Note  Patient: Garrett Carroll  Procedure(s) Performed: TRANSESOPHAGEAL ECHOCARDIOGRAM CARDIOVERSION  Patient Location: Cath Lab  Anesthesia Type:MAC  Level of Consciousness: awake  Airway & Oxygen Therapy: Patient Spontanous Breathing  Post-op Assessment: Report given to RN and Post -op Vital signs reviewed and stable  Post vital signs: Reviewed and stable  Last Vitals:  Vitals Value Taken Time  BP 91/71 09/22/22 1450  Temp    Pulse 69 09/22/22 1457  Resp 10 09/22/22 1457  SpO2 93 % 09/22/22 1457  Vitals shown include unvalidated device data.  Last Pain:  Vitals:   09/22/22 1450  TempSrc:   PainSc: 0-No pain         Complications: No notable events documented.

## 2022-09-23 ENCOUNTER — Other Ambulatory Visit (HOSPITAL_COMMUNITY): Payer: Self-pay

## 2022-09-23 DIAGNOSIS — Z9289 Personal history of other medical treatment: Secondary | ICD-10-CM

## 2022-09-23 DIAGNOSIS — I5023 Acute on chronic systolic (congestive) heart failure: Secondary | ICD-10-CM | POA: Diagnosis not present

## 2022-09-23 LAB — CULTURE, BLOOD (ROUTINE X 2): Culture: NO GROWTH

## 2022-09-23 LAB — BASIC METABOLIC PANEL
Anion gap: 10 (ref 5–15)
BUN: 13 mg/dL (ref 8–23)
CO2: 22 mmol/L (ref 22–32)
Calcium: 8.4 mg/dL — ABNORMAL LOW (ref 8.9–10.3)
Chloride: 103 mmol/L (ref 98–111)
Creatinine, Ser: 1.29 mg/dL — ABNORMAL HIGH (ref 0.61–1.24)
GFR, Estimated: >60 mL/min (ref >60)
Glucose, Bld: 88 mg/dL (ref 70–99)
Potassium: 4 mmol/L (ref 3.5–5.1)
Sodium: 135 mmol/L (ref 135–145)

## 2022-09-23 NOTE — Discharge Summary (Signed)
Physician Discharge Summary  Kyrian Stage YQI:[id number] DOB: March 12, 1956 DOA: 09/19/2022  PCP: Mirna Mires, MD  Admit date: 09/19/2022 Discharge date: 09/23/2022  Time spent: 45 minutes  Recommendations for Outpatient Follow-up:  Cardiology Dr. Odis Hollingshead in 1 week, further GDMT as outpatient if blood pressure tolerates PCP in 1 week, recheck T4, TSH in 6 weeks   Discharge Diagnoses:  Principal Problem:   Acute on chronic systolic CHF (congestive heart failure) (HCC) Severe biventricular failure   Paroxysmal nocturnal dyspnea   Atrial fibrillation with rapid ventricular response (HCC)   Protein-calorie malnutrition, severe   Biventricular heart failure, NYHA class 3 (HCC)   Long term current use of antiarrhythmic drug   Long term (current) use of anticoagulants   Cardiomyopathy (HCC)   Positive D dimer   Aneurysm of ascending aorta without rupture (HCC)   Non compliance w medication regimen   HFrEF (heart failure with reduced ejection fraction) (HCC)   Nonischemic cardiomyopathy (HCC)   Discharge Condition: Improved  Diet recommendation: Low-sodium, heart healthy  Filed Weights   09/21/22 0431 09/22/22 0340 09/23/22 0550  Weight: 83.2 kg 80 kg 81.6 kg    History of present illness:  66/M with recently diagnosed cardiomyopathy, BiV failure, EF of 20-25% with severely reduced RV, mitral regurg, A-fib presented to the ED with shortness of breath and lower extremity edema X 1 week. -Recent admission in March with A-fib RVR, declined cardioversion and LHC then -In the ED hypotensive, A-fib heart rate in the 1 teens, lactate 1.5, troponin 15, albumin 3.3 -Cath 6/20 noted nonischemic cardiomyopathy, compensated  Hospital Course:  A-fib with RVR -Treated with IV amiodarone and apixaban, underwent TEE/diversion 6/21, now in sinus rhythm -Discharged on oral amiodarone, follow-up with [facility] cardiology   Acute on chronic systolic CHF, BiV failure Severe mitral regurgitation,  severe TR Recent diagnosis -Echo 5/24 with EF 20-25%, severely reduced RV severe mitral regurgitation -Diuresed with IV Lasix, volume status has improved -GDMT limited by hypotension, discharged on Lasix midodrine and Farxiga -Cards following, cath with NICM, compensated CHF  -Overall prognosis is felt to be poor, palliative consulted, patient wants to continue full aggressive care for now -Follow-up with [facility] cardiology in 2 weeks   Thrombocytopenia: Acute on chronic on last admission as low as 131, currently 105, monitor -May have chronic liver disease from right heart failure   Ascending aortic aneurysm -Needs follow-up   Protein-calorie malnutrition, severe- RD consulted Body mass index is 21.65 kg/m.    Abnormal TSH -Was normal 3 months ago, 16.7 now, -Recommend repeat labs in 2 months       Discharge Exam: Vitals:   09/23/22 0550 09/23/22 0758  BP: 91/75 99/77  Pulse:    Resp:  18  Temp:  98.4 F (36.9 C)  SpO2:     Gen: Awake, Alert, Oriented X 3,  HEENT: no JVD Lungs: Good air movement bilaterally, CTAB CVS: S1S2/RRR Abd: soft, Non tender, non distended, BS present Extremities: Trace edema Skin: no new rashes on exposed skin   Discharge Instructions   Discharge Instructions     Diet - low sodium heart healthy   Complete by: As directed    Increase activity slowly   Complete by: As directed       Allergies as of 09/23/2022   No Known Allergies      Medication List     STOP taking these medications    empagliflozin 10 MG Tabs tablet Commonly known as: JARDIANCE   metoprolol succinate 50 MG 24  hr tablet Commonly known as: TOPROL-XL   sacubitril-valsartan 24-26 MG Commonly known as: ENTRESTO       TAKE these medications    amiodarone 200 MG tablet Commonly known as: PACERONE Take 1 tablet (200 mg total) by mouth 2 (two) times daily.   apixaban 5 MG Tabs tablet Commonly known as: ELIQUIS Take 1 tablet (5 mg total) by mouth 2  (two) times daily. What changed: when to take this   Farxiga 10 MG Tabs tablet Generic drug: dapagliflozin propanediol Take 1 tablet (10 mg total) by mouth daily.   furosemide 20 MG tablet Commonly known as: LASIX Take 20 mg by mouth daily as needed for fluid or edema (weight increase by 3 pounds over one day or 5 pounds over week.).   midodrine 5 MG tablet Commonly known as: PROAMATINE Take 1 tablet (5 mg total) by mouth 2 (two) times daily with a meal.       No Known Allergies    The results of significant diagnostics from this hospitalization (including imaging, microbiology, ancillary and laboratory) are listed below for reference.    Significant Diagnostic Studies: ECHO TEE  Result Date: 09/22/2022    TRANSESOPHOGEAL ECHO REPORT   Patient Name:   ALEXES LAMARQUE Date of Exam: 09/22/2022 Medical Rec #:  [id number]      Height:       81.0 in Accession #:    [id number]     Weight:       176.3 lb Date of Birth:  [birth year]      BSA:          2.199 m Patient Age:    66 years       BP:           94/77 mmHg Patient Gender: M              HR:           130 bpm. Exam Location:  Inpatient Procedure: Transesophageal Echo, Color Doppler and Cardiac Doppler Indications:     atrial fibrillation  History:         Patient has prior history of Echocardiogram examinations, most                  recent 09/20/2022. Cardiomyopathy; Signs/Symptoms:Shortness of                  Breath.  Sonographer:     Delcie Roch RDCS Referring Phys:  [id number] Tessa Lerner Diagnosing Phys: Yates Decamp MD PROCEDURE: After discussion of the risks and benefits of a TEE, an informed consent was obtained from the patient. The transesophogeal probe was passed without difficulty through the esophogus of the patient. Imaged were obtained with the patient in a left lateral decubitus position. Local oropharyngeal anesthetic was provided with Cetacaine. Sedation performed by different physician. The patient was monitored while under  deep sedation. Anesthestetic sedation was provided intravenously by Anesthesiology: 40mg  of Propofol, 50mg  of Lidocaine. The patient developed no complications during the procedure. A direct current cardioversion was performed.  IMPRESSIONS  1. Left ventricular ejection fraction, by estimation, is <20%. The left ventricle has severely decreased function. The left ventricle demonstrates global hypokinesis. The left ventricular internal cavity size was mildly dilated. There is mild left ventricular hypertrophy. Left ventricular diastolic function could not be evaluated.  2. Right ventricular systolic function is moderately reduced. The right ventricular size is mildly enlarged. There is mildly elevated pulmonary artery systolic pressure. The estimated right ventricular systolic pressure  is 46.6 mmHg.  3. Left atrial size was severely dilated. No left atrial/left atrial appendage thrombus was detected. The LAA emptying velocity was 15 cm/s.  4. Right atrial size was moderately dilated.  5. The mitral valve is normal in structure. Mild mitral valve regurgitation. No evidence of mitral stenosis.  6. Mild annular dilatation. Tricuspid valve regurgitation is moderate.  7. The aortic valve is normal in structure. Aortic valve regurgitation is not visualized. No aortic stenosis is present.  8. There is mild (Grade II) plaque involving the ascending aorta and descending aorta. Conclusion(s)/Recommendation(s): No LAA thrombus, proceed with cardioversion. FINDINGS  Left Ventricle: Left ventricular ejection fraction, by estimation, is <20%. The left ventricle has severely decreased function. The left ventricle demonstrates global hypokinesis. The left ventricular internal cavity size was mildly dilated. There is mild left ventricular hypertrophy. Left ventricular diastolic function could not be evaluated. Right Ventricle: The right ventricular size is mildly enlarged. No increase in right ventricular wall thickness. Right  ventricular systolic function is moderately reduced. There is mildly elevated pulmonary artery systolic pressure. The tricuspid regurgitant velocity is 2.81 m/s, and with an assumed right atrial pressure of 15 mmHg, the estimated right ventricular systolic pressure is 46.6 mmHg. Left Atrium: Mild smoke. LAA well visualized with no thrombus. Left atrial size was severely dilated. Spontaneous echo contrast was present in the left atrium and left atrial appendage. No left atrial/left atrial appendage thrombus was detected. The LAA emptying velocity was 15 cm/s. Right Atrium: Right atrial size was moderately dilated. Pericardium: There is no evidence of pericardial effusion. Mitral Valve: The mitral valve is normal in structure. Mild mitral valve regurgitation. No evidence of mitral valve stenosis. Tricuspid Valve: Mild annular dilatation. The tricuspid valve is normal in structure. Tricuspid valve regurgitation is moderate . No evidence of tricuspid stenosis. Aortic Valve: The aortic valve is normal in structure. Aortic valve regurgitation is not visualized. No aortic stenosis is present. Pulmonic Valve: The pulmonic valve was normal in structure. Pulmonic valve regurgitation is trivial. No evidence of pulmonic stenosis. Aorta: The aortic root, ascending aorta and aortic arch are all structurally normal, with no evidence of dilitation or obstruction. There is mild (Grade II) plaque involving the ascending aorta and descending aorta. Venous: Assuming dilated IVC with blunted respiratory response. The inferior vena cava was not well visualized. IAS/Shunts: No atrial level shunt detected by color flow Doppler.  TRICUSPID VALVE TR Peak grad:   31.6 mmHg TR Vmax:        281.00 cm/s Yates Decamp MD Electronically signed by Yates Decamp MD Signature Date/Time: 09/22/2022/6:14:45 PM    Final    EP STUDY  Result Date: 09/22/2022 See surgical note for result.  CARDIAC CATHETERIZATION  Result Date: 09/21/2022 LM: Normal LAD:  Normal RI: Normal Lcx: Normal RCA: Normal RA: 5 mmHg RV: 32/5 mmHg PA: 27/4 mmHg, mPAP 22 mmHg PCW: Could not be obtained as catheter could not reach the wedge position (Patient is 6 ft 9 in). LV 82/4 mmHg LVEDP 8 mmHg (Patient was given 500 cc saline bolus) No coronary artery disease Compensated nonischemic cardiomyopathy Borderline pulmonary hypertension, WHO grp II Continue GDMT for heart failure and sinus rhythm restoration Okay to resume OAC this afternoon Elder Negus, MD Pager: [phone] Office: [phone]  ECHOCARDIOGRAM LIMITED  Result Date: 09/20/2022    ECHOCARDIOGRAM LIMITED REPORT   Patient Name:   ZENAS SANTA Date of Exam: 09/20/2022 Medical Rec #:  [id number]      Height:  81.0 in Accession #:    [id number]     Weight:       198.0 lb Date of Birth:  September 29, 1955      BSA:          2.310 m Patient Age:    66 years       BP:           89/69 mmHg Patient Gender: M              HR:           102 bpm. Exam Location:  Inpatient Procedure: Limited Echo, Cardiac Doppler and Color Doppler Indications:     CHF  History:         Patient has prior history of Echocardiogram examinations, most                  recent 06/12/2022. CHF and Cardiomyopathy, Mitral Valve Disease;                  Arrythmias:Atrial Fibrillation.  Sonographer:     Darlys Gales Referring Phys:  [id number] SUNIT TOLIA Diagnosing Phys: Tessa Lerner DO IMPRESSIONS  1. LIMITED ECHO - for more details refer to full echo from June 12, 2022.  2. Left ventricular ejection fraction, by estimation, is <20%. The left ventricle has severely decreased function. The left ventricle demonstrates global hypokinesis. The left ventricular internal cavity size was dilated. There is mild left ventricular hypertrophy. Left ventricular diastolic function could not be evaluated.  3. RVSP is underestimated due to dilated RV and RA chambers. . Right ventricular systolic function is severely reduced. The right ventricular size is severely enlarged.  There is mildly elevated pulmonary artery systolic pressure. The estimated right ventricular systolic pressure is 40.0 mmHg.  4. Left atrial size was mildly dilated.  5. Right atrial size was severely dilated.  6. The mitral valve is degenerative. Severe mitral valve regurgitation. No evidence of mitral stenosis.  7. Tricuspid valve regurgitation is severe.  8. The aortic valve is grossly normal. Aortic valve regurgitation is not visualized. No aortic stenosis is present.  9. Aortic proximal ascending aorta at upper limit of normal (37mm). There is borderline dilatation of the aortic root, measuring 38 mm. 10. Dilated pulmonary artery. 11. The inferior vena cava is dilated in size with <50% respiratory variability, suggesting right atrial pressure of 15 mmHg. 12. Rhythm strip during this exam demonstrates atrial fibrillation. Comparison(s): A prior study was performed on 06/12/2022. No significant change. Conclusion(s)/Recommendation(s): No left ventricular mural or apical thrombus/thrombi. FINDINGS  Left Ventricle: Left ventricular ejection fraction, by estimation, is <20%. The left ventricle has severely decreased function. The left ventricle demonstrates global hypokinesis. Definity contrast agent was given IV to delineate the left ventricular endocardial borders. The left ventricular internal cavity size was dilated. There is mild left ventricular hypertrophy. Left ventricular diastolic function could not be evaluated. Left ventricular diastolic function could not be evaluated due to atrial fibrillation. Right Ventricle: RVSP is underestimated due to dilated RV and RA chambers. The right ventricular size is severely enlarged. Right ventricular systolic function is severely reduced. There is mildly elevated pulmonary artery systolic pressure. The tricuspid regurgitant velocity is 2.50 m/s, and with an assumed right atrial pressure of 15 mmHg, the estimated right ventricular systolic pressure is 40.0 mmHg. Left  Atrium: Left atrial size was mildly dilated. Right Atrium: Right atrial size was severely dilated. Pericardium: There is no evidence of pericardial effusion. Mitral Valve: The mitral valve is  degenerative in appearance. Severe mitral valve regurgitation, with centrally-directed jet. No evidence of mitral valve stenosis. Tricuspid Valve: The tricuspid valve is grossly normal. Tricuspid valve regurgitation is severe. No evidence of tricuspid stenosis. Aortic Valve: The aortic valve is grossly normal. Aortic valve regurgitation is not visualized. No aortic stenosis is present. Pulmonic Valve: The pulmonic valve was grossly normal. Aorta: Proximal ascending aorta at upper limit of normal (37mm). There is borderline dilatation of the aortic root, measuring 38 mm. Pulmonary Artery: The pulmonary artery is dilated. Venous: The inferior vena cava is dilated in size with less than 50% respiratory variability, suggesting right atrial pressure of 15 mmHg. EKG: Rhythm strip during this exam demonstrates atrial fibrillation. LEFT VENTRICLE PLAX 2D LVIDd:         7.00 cm LVIDs:         6.40 cm LV PW:         1.30 cm LV IVS:        1.10 cm LVOT diam:     2.00 cm LVOT Area:     3.14 cm  IVC IVC diam: 2.80 cm LEFT ATRIUM           Index        RIGHT ATRIUM           Index LA Vol (A2C): 62.9 ml 27.23 ml/m  RA Area:     34.40 cm LA Vol (A4C): 71.0 ml 30.73 ml/m  RA Volume:   140.00 ml 60.60 ml/m   AORTA Ao Root diam: 3.80 cm Ao Asc diam:  3.70 cm MR Peak grad:    47.3 mmHg    TRICUSPID VALVE MR Mean grad:    27.0 mmHg    TR Peak grad:   25.0 mmHg MR Vmax:         344.00 cm/s  TR Vmax:        250.00 cm/s MR Vmean:        249.0 cm/s MR PISA:         6.28 cm     SHUNTS MR PISA Eff ROA: 109 mm      Systemic Diam: 2.00 cm MR PISA Radius:  1.00 cm Sunit Tolia DO Electronically signed by Tessa Lerner DO Signature Date/Time: 09/20/2022/9:08:13 PM    Final    CT Angio Chest PE W and/or Wo Contrast  Result Date: 09/19/2022 CLINICAL  DATA:  Shortness of breath and lower extremity swelling for 1 week. Dizziness starting today. EXAM: CT ANGIOGRAPHY CHEST WITH CONTRAST TECHNIQUE: Multidetector CT imaging of the chest was performed using the standard protocol during bolus administration of intravenous contrast. Multiplanar CT image reconstructions and MIPs were obtained to evaluate the vascular anatomy. RADIATION DOSE REDUCTION: This exam was performed according to the departmental dose-optimization program which includes automated exposure control, adjustment of the mA and/or kV according to patient size and/or use of iterative reconstruction technique. CONTRAST:  75mL OMNIPAQUE IOHEXOL 350 MG/ML SOLN COMPARISON:  09/19/2022 chest radiograph FINDINGS: Cardiovascular: No filling defect is identified in the pulmonary arterial tree to suggest pulmonary embolus. Ascending thoracic aortic aneurysm 4.1 cm in diameter. Prominent main pulmonary artery favoring pulmonary arterial hypertension. Moderate cardiomegaly. There is no significant systemic arterial contrast opacification on today's exam which was timed for pulmonary arterial opacification. Mediastinum/Nodes: Hazy density in the mediastinum and subcutaneous tissues favoring mild third spacing of fluid. Lungs/Pleura: Small left and small to moderate right pleural effusions. The oval-shaped density seen on recent chest radiography is immediately along the minor fissure and  is of low-density, favoring a small amount of fluid in the minor fissure. There is bandlike atelectasis in the right middle lobe and passive atelectasis associated with both pleural effusions. Mild subsegmental atelectasis in the lingula. There is some hazy dependent density in both lungs probably from low-grade edema. Upper Abdomen: Suspected mild perihepatic ascites. Suspected mild fluid infiltration along upper abdominal adipose tissues. Musculoskeletal: Thoracic spondylosis. Review of the MIP images confirms the above findings.  IMPRESSION: 1. No filling defect is identified in the pulmonary arterial tree to suggest pulmonary embolus. 2. Small left and small to moderate right pleural effusions with passive atelectasis associated with both pleural effusions. The oval-shaped density seen on recent chest radiography is immediately along the minor fissure and is of low-density, favoring a small amount of fluid in the minor fissure. 3. Moderate cardiomegaly. 4. Prominent main pulmonary artery favoring pulmonary arterial hypertension. 5. Suspected third spacing of fluid with hazy density in mediastinal, subcutaneous, and upper abdominal adipose tissues and suspected mild perihepatic ascites. 6. Ascending thoracic aortic aneurysm 4.1 cm in diameter. Recommend annual imaging followup by CTA or MRA. This recommendation follows 2010 ACCF/AHA/AATS/ACR/ASA/SCA/SCAI/SIR/STS/SVM Guidelines for the Diagnosis and Management of Patients with Thoracic Aortic Disease. Circulation. 2010; 121: I696-E952. Aortic aneurysm NOS (ICD10-I71.9) 7. Thoracic spondylosis. Electronically Signed   By: Gaylyn Rong M.D.   On: 09/19/2022 18:43   VAS [place] LOWER EXTREMITY VENOUS (DVT)  Result Date: 09/19/2022  Lower Venous DVT Study Patient Name:  FEDERICO MAIORINO  Date of Exam:   09/19/2022 Medical Rec #: [id number]       Accession #:    [id number] Date of Birth: [birth year]       Patient Gender: M Patient Age:   77 years Exam Location:  [facility] Procedure:      VAS [place] LOWER EXTREMITY VENOUS (DVT) Referring Phys: ADAM CURATOLO --------------------------------------------------------------------------------  Indications: Edema, and SOB.  Comparison Study: No previous study. Performing Technologist: McKayla Maag RVT, [place]  Examination Guidelines: A complete evaluation includes B-mode imaging, spectral Doppler, color Doppler, and power Doppler as needed of all accessible portions of each vessel. Bilateral testing is considered an integral part of a complete  examination. Limited examinations for reoccurring indications may be performed as noted. The reflux portion of the exam is performed with the patient in reverse Trendelenburg.  +---------+---------------+---------+-----------+----------+--------------+ RIGHT    CompressibilityPhasicitySpontaneityPropertiesThrombus Aging +---------+---------------+---------+-----------+----------+--------------+ CFV      Full           Yes      Yes                                 +---------+---------------+---------+-----------+----------+--------------+ SFJ      Full                                                        +---------+---------------+---------+-----------+----------+--------------+ FV Prox  Full                                                        +---------+---------------+---------+-----------+----------+--------------+ FV Mid   Full                                                        +---------+---------------+---------+-----------+----------+--------------+  FV DistalFull                                                        +---------+---------------+---------+-----------+----------+--------------+ PFV      Full                                                        +---------+---------------+---------+-----------+----------+--------------+ POP      Full           Yes      Yes                                 +---------+---------------+---------+-----------+----------+--------------+ PTV      Full                                                        +---------+---------------+---------+-----------+----------+--------------+ PERO     Full                                                        +---------+---------------+---------+-----------+----------+--------------+   +----+---------------+---------+-----------+----------+--------------+ LEFTCompressibilityPhasicitySpontaneityPropertiesThrombus Aging  +----+---------------+---------+-----------+----------+--------------+ CFV Full           Yes      Yes                                 +----+---------------+---------+-----------+----------+--------------+ SFJ Full                                                        +----+---------------+---------+-----------+----------+--------------+     Summary: RIGHT: - There is no evidence of deep vein thrombosis in the lower extremity.  - No cystic structure found in the popliteal fossa.  LEFT: - No evidence of common femoral vein obstruction.  *See table(s) above for measurements and observations. Electronically signed by Waverly Ferrari MD on 09/19/2022 at 6:19:35 PM.    Final    DG Chest 2 View  Result Date: 09/19/2022 CLINICAL DATA:  Shortness of breath. EXAM: CHEST - 2 VIEW COMPARISON:  June 11, 2022. FINDINGS: Stable cardiomegaly. Mild bibasilar subsegmental atelectasis or edema is noted with small bilateral pleural effusions. Stable ovoid shaped nodular density seen in right midlung. Bony thorax is unremarkable. IMPRESSION: Cardiomegaly. Mild bibasilar subsegmental atelectasis or edema is noted with small pleural effusions. Grossly stable ovoid nodular density seen in right midlung; follow-up CT scan or chest radiograph in 3 months is recommended to ensure stability. Electronically Signed   By: Lupita Raider M.D.   On: 09/19/2022 16:30    Microbiology: Recent Results (from the past 240  hour(s))  Blood culture (routine x 2)     Status: None (Preliminary result)   Collection Time: 09/19/22  4:10 PM   Specimen: BLOOD  Result Value Ref Range Status   Specimen Description BLOOD SITE NOT SPECIFIED  Final   Special Requests   Final    BOTTLES DRAWN AEROBIC AND ANAEROBIC Blood Culture results may not be optimal due to an inadequate volume of blood received in culture bottles   Culture   Final    NO GROWTH 4 DAYS Performed at [facility], [address], [place], [place] [address]     Report Status PENDING  Incomplete  Blood culture (routine x 2)     Status: None (Preliminary result)   Collection Time: 09/19/22  4:24 PM   Specimen: BLOOD  Result Value Ref Range Status   Specimen Description BLOOD SITE NOT SPECIFIED  Final   Special Requests   Final    BOTTLES DRAWN AEROBIC AND ANAEROBIC Blood Culture results may not be optimal due to an inadequate volume of blood received in culture bottles   Culture   Final    NO GROWTH 4 DAYS Performed at [facility], [address], [place], [place] [address]    Report Status PENDING  Incomplete     Labs: Basic Metabolic Panel: Recent Labs  Lab 09/19/22 1537 09/20/22 0040 09/21/22 0222 09/21/22 0801 09/21/22 0805 09/21/22 0806 09/21/22 0811 09/22/22 0248 09/23/22 0253  NA 141 138 140   < > 141 141 141 136 135  K 3.6 3.6 4.0   < > 3.5 3.7 3.6 3.9 4.0  CL 107 108 106  --   --   --   --  103 103  CO2 19* 19* 24  --   --   --   --  24 22  GLUCOSE 131* 113* 94  --   --   --   --  94 88  BUN 10 11 12   --   --   --   --  12 13  CREATININE 1.23 1.17 1.37*  --   --   --   --  1.36* 1.29*  CALCIUM 8.8* 8.4* 8.6*  --   --   --   --  8.3* 8.4*   < > = values in this interval not displayed.   Liver Function Tests: Recent Labs  Lab 09/19/22 1548 09/20/22 0040  AST 25 21  ALT 34 30  ALKPHOS 57 52  BILITOT 3.5* 2.7*  PROT 5.9* 5.6*  ALBUMIN 3.3* 3.1*   No results for input(s): "LIPASE", "AMYLASE" in the last 168 hours. No results for input(s): "AMMONIA" in the last 168 hours. CBC: Recent Labs  Lab 09/19/22 1548 09/20/22 0040 09/21/22 0222 09/21/22 0801 09/21/22 0805 09/21/22 0806 09/21/22 0811 09/22/22 0248  WBC 6.1 5.9 5.2  --   --   --   --  4.2  NEUTROABS 4.3  --   --   --   --   --   --   --   HGB 13.3 13.3 12.6* 12.6* 12.2* 12.6* 12.6* 12.8*  HCT 39.9 40.0 38.3* 37.0* 36.0* 37.0* 37.0* 37.9*  MCV 94.1 92.4 91.6  --   --   --   --  93.3  PLT 105* 103* 100*  --   --   --   --  108*   Cardiac  Enzymes: No results for input(s): "CKTOTAL", "CKMB", "CKMBINDEX", "TROPONINI" in the last 168 hours.  BNP: BNP (last 3 results) Recent Labs    06/11/22 1522 09/19/22 1545 09/22/22 0932  BNP 618.1* 1,138.8* 415.1*    ProBNP (last 3 results) Recent Labs    06/26/22 1133  PROBNP 2,812*    CBG: No results for input(s): "GLUCAP" in the last 168 hours.     Signed:  Zannie Cove MD.  [facility] 09/23/2022, 10:58 AM

## 2022-09-23 NOTE — Progress Notes (Signed)
Progress Note  Patient Name: Garrett Carroll MRN: [id number] DOB: [birth year] Date of Encounter: 09/23/2022  Attending physician: Zannie Cove, MD Primary care provider: Mirna Mires, MD Primary Cardiologist: Tessa Lerner, DO, [facility]  Subjective: [name] is a 67 y.o. African-American male who was seen and examined at bedside  No CP or dyspnea  Able to lay flat No post-procedural complications.  Remains in SR  Objective: Vital Signs in the last 24 hours: Temp:  [97.7 F (36.5 C)-98.4 F (36.9 C)] 98.4 F (36.9 C) (06/22 0758) Pulse Rate:  [73-101] 77 (06/22 0416) Resp:  [11-21] 18 (06/22 0758) BP: (83-106)/(64-92) 99/77 (06/22 0758) SpO2:  [93 %-99 %] 99 % (06/22 0416) Weight:  [81.6 kg] 81.6 kg (06/22 0550)  Intake/Output:  Intake/Output Summary (Last 24 hours) at 09/23/2022 1308 Last data filed at 09/23/2022 0800 Gross per 24 hour  Intake 527.94 ml  Output 700 ml  Net -172.06 ml    Net IO Since Admission: -5,709.16 mL [09/23/22 1308]  Weights:     09/23/2022    5:50 AM 09/22/2022    3:40 AM 09/21/2022    4:31 AM  Last 3 Weights  Weight (lbs) 179 lb 14.4 oz 176 lb 4.8 oz 183 lb 8 oz  Weight (kg) 81.602 kg 79.969 kg 83.235 kg      Telemetry:  Overnight telemetry shows sinus rhythm with rare ectopy, which I personally reviewed.   Physical examination: PHYSICAL EXAM: Vitals:   09/22/22 2345 09/23/22 0416 09/23/22 0550 09/23/22 0758  BP: (!) 86/70 (!) 88/70 91/75 99/77   Pulse: 77 77    Resp: 15 18  18   Temp: 97.7 F (36.5 C) 98.4 F (36.9 C)  98.4 F (36.9 C)  TempSrc: Oral Oral  Oral  SpO2: 94% 99%    Weight:   81.6 kg   Height:        Physical Exam  Constitutional: No distress.  Appears older than stated age, no acute distress, hemodynamically stable.  Neck: No JVD present.  Cardiovascular: Normal rate, regular rhythm, S1 normal, S2 normal, intact distal pulses and normal pulses. Exam reveals no gallop, no S3 and no S4.  Murmur  heard. Holosystolic murmur is present with a grade of 3/6 at the apex. Pulmonary/Chest: No stridor. He has no wheezes. He has no rales.  Decreased breath sounds bilaterally.  Abdominal: Soft. Bowel sounds are normal. He exhibits no distension. There is no abdominal tenderness.  Musculoskeletal:        General: Edema (Bilateral trace) present.     Cervical back: Neck supple.     Comments: Warm to touch bilaterally.  Neurological: He is alert and oriented to person, place, and time. He has intact cranial nerves (2-12).  Skin: Skin is warm and moist.    Lab Results: Chemistry Recent Labs  Lab 09/19/22 1548 09/20/22 0040 09/21/22 0222 09/21/22 0801 09/21/22 0811 09/22/22 0248 09/23/22 0253  NA  --  138 140   < > 141 136 135  K  --  3.6 4.0   < > 3.6 3.9 4.0  CL  --  108 106  --   --  103 103  CO2  --  19* 24  --   --  24 22  GLUCOSE  --  113* 94  --   --  94 88  BUN  --  11 12  --   --  12 13  CREATININE  --  1.17 1.37*  --   --  1.36* 1.29*  CALCIUM  --  8.4* 8.6*  --   --  8.3* 8.4*  PROT 5.9* 5.6*  --   --   --   --   --   ALBUMIN 3.3* 3.1*  --   --   --   --   --   AST 25 21  --   --   --   --   --   ALT 34 30  --   --   --   --   --   ALKPHOS 57 52  --   --   --   --   --   BILITOT 3.5* 2.7*  --   --   --   --   --   GFRNONAA  --  >60 57*  --   --  58* >60  ANIONGAP  --  11 10  --   --  9 10   < > = values in this interval not displayed.    Hematology Recent Labs  Lab 09/20/22 0040 09/21/22 0222 09/21/22 0801 09/21/22 0806 09/21/22 0811 09/22/22 0248  WBC 5.9 5.2  --   --   --  4.2  RBC 4.33 4.18*  --   --   --  4.06*  HGB 13.3 12.6*   < > 12.6* 12.6* 12.8*  HCT 40.0 38.3*   < > 37.0* 37.0* 37.9*  MCV 92.4 91.6  --   --   --  93.3  MCH 30.7 30.1  --   --   --  31.5  MCHC 33.3 32.9  --   --   --  33.8  RDW 15.7* 15.6*  --   --   --  15.2  PLT 103* 100*  --   --   --  108*   < > = values in this interval not displayed.   High Sensitivity Troponin:    Recent Labs  Lab 09/19/22 1548 09/19/22 1624  TROPONINIHS 15 12     Cardiac EnzymesNo results for input(s): "TROPONINI" in the last 168 hours. No results for input(s): "TROPIPOC" in the last 168 hours.  BNP Recent Labs  Lab 09/19/22 1545 09/22/22 0932  BNP 1,138.8* 415.1*    DDimer  Recent Labs  Lab 09/19/22 1548  DDIMER 1.46*    Hemoglobin A1c:  Lab Results  Component Value Date   HGBA1C 5.8 (H) 06/12/2022   MPG 120 06/12/2022   TSH  Recent Labs    06/11/22 1524 09/20/22 0040  TSH 2.376 16.790*   Lipid Panel  Lab Results  Component Value Date   CHOL 121 06/12/2022   HDL 54 06/12/2022   LDLCALC 61 06/12/2022   LDLDIRECT 62 06/12/2022   TRIG 32 06/12/2022   CHOLHDL 2.2 06/12/2022   Drugs of Abuse     Component Value Date/Time   LABOPIA NONE DETECTED 09/19/2022 2019   COCAINSCRNUR NONE DETECTED 09/19/2022 2019   LABBENZ NONE DETECTED 09/19/2022 2019   AMPHETMU NONE DETECTED 09/19/2022 2019   THCU NONE DETECTED 09/19/2022 2019   LABBARB NONE DETECTED 09/19/2022 2019      Imaging: ECHO TEE  Result Date: 09/22/2022    TRANSESOPHOGEAL ECHO REPORT   Patient Name:   ESTES LEHNER Date of Exam: 09/22/2022 Medical Rec #:  [id number]      Height:       81.0 in Accession #:    [id number]     Weight:       176.3 lb Date of Birth:  [birth year]      BSA:          2.199 m Patient Age:    66 years       BP:           94/77 mmHg Patient Gender: M              HR:           130 bpm. Exam Location:  Inpatient Procedure: Transesophageal Echo, Color Doppler and Cardiac Doppler Indications:     atrial fibrillation  History:         Patient has prior history of Echocardiogram examinations, most                  recent 09/20/2022. Cardiomyopathy; Signs/Symptoms:Shortness of                  Breath.  Sonographer:     Delcie Roch RDCS Referring Phys:  [id number] Tessa Lerner Diagnosing Phys: Yates Decamp MD PROCEDURE: After discussion of the risks and benefits of a TEE, an informed  consent was obtained from the patient. The transesophogeal probe was passed without difficulty through the esophogus of the patient. Imaged were obtained with the patient in a left lateral decubitus position. Local oropharyngeal anesthetic was provided with Cetacaine. Sedation performed by different physician. The patient was monitored while under deep sedation. Anesthestetic sedation was provided intravenously by Anesthesiology: 40mg  of Propofol, 50mg  of Lidocaine. The patient developed no complications during the procedure. A direct current cardioversion was performed.  IMPRESSIONS  1. Left ventricular ejection fraction, by estimation, is <20%. The left ventricle has severely decreased function. The left ventricle demonstrates global hypokinesis. The left ventricular internal cavity size was mildly dilated. There is mild left ventricular hypertrophy. Left ventricular diastolic function could not be evaluated.  2. Right ventricular systolic function is moderately reduced. The right ventricular size is mildly enlarged. There is mildly elevated pulmonary artery systolic pressure. The estimated right ventricular systolic pressure is 46.6 mmHg.  3. Left atrial size was severely dilated. No left atrial/left atrial appendage thrombus was detected. The LAA emptying velocity was 15 cm/s.  4. Right atrial size was moderately dilated.  5. The mitral valve is normal in structure. Mild mitral valve regurgitation. No evidence of mitral stenosis.  6. Mild annular dilatation. Tricuspid valve regurgitation is moderate.  7. The aortic valve is normal in structure. Aortic valve regurgitation is not visualized. No aortic stenosis is present.  8. There is mild (Grade II) plaque involving the ascending aorta and descending aorta. Conclusion(s)/Recommendation(s): No LAA thrombus, proceed with cardioversion. FINDINGS  Left Ventricle: Left ventricular ejection fraction, by estimation, is <20%. The left ventricle has severely decreased  function. The left ventricle demonstrates global hypokinesis. The left ventricular internal cavity size was mildly dilated. There is mild left ventricular hypertrophy. Left ventricular diastolic function could not be evaluated. Right Ventricle: The right ventricular size is mildly enlarged. No increase in right ventricular wall thickness. Right ventricular systolic function is moderately reduced. There is mildly elevated pulmonary artery systolic pressure. The tricuspid regurgitant velocity is 2.81 m/s, and with an assumed right atrial pressure of 15 mmHg, the estimated right ventricular systolic pressure is 46.6 mmHg. Left Atrium: Mild smoke. LAA well visualized with no thrombus. Left atrial size was severely dilated. Spontaneous echo contrast was present in the left atrium and left atrial appendage. No left atrial/left atrial appendage thrombus was detected. The LAA emptying velocity was 15 cm/s. Right Atrium: Right atrial size was  moderately dilated. Pericardium: There is no evidence of pericardial effusion. Mitral Valve: The mitral valve is normal in structure. Mild mitral valve regurgitation. No evidence of mitral valve stenosis. Tricuspid Valve: Mild annular dilatation. The tricuspid valve is normal in structure. Tricuspid valve regurgitation is moderate . No evidence of tricuspid stenosis. Aortic Valve: The aortic valve is normal in structure. Aortic valve regurgitation is not visualized. No aortic stenosis is present. Pulmonic Valve: The pulmonic valve was normal in structure. Pulmonic valve regurgitation is trivial. No evidence of pulmonic stenosis. Aorta: The aortic root, ascending aorta and aortic arch are all structurally normal, with no evidence of dilitation or obstruction. There is mild (Grade II) plaque involving the ascending aorta and descending aorta. Venous: Assuming dilated IVC with blunted respiratory response. The inferior vena cava was not well visualized. IAS/Shunts: No atrial level shunt  detected by color flow Doppler.  TRICUSPID VALVE TR Peak grad:   31.6 mmHg TR Vmax:        281.00 cm/s Yates Decamp MD Electronically signed by Yates Decamp MD Signature Date/Time: 09/22/2022/6:14:45 PM    Final    EP STUDY  Result Date: 09/22/2022 See surgical note for result.   CARDIAC DATABASE: TEE guided direct-current cardioversion: 09/22/2022: No LA or left atrial appendage thrombus.  Status post 150 J x 1  EKG: 06/19/2022: Atrial fibrillation, 100 bpm, LVH with voltage, PVCs, prolonged QT.    Echocardiogram: 06/12/2022:   1. Left ventricular ejection fraction, by estimation, is 20 to 25%. The left ventricle has severely decreased function. The left ventricle demonstrates global hypokinesis. The left ventricular internal cavity size was moderately dilated. Left  ventricular diastolic function could not be evaluated.  2. Right ventricular systolic function is severely reduced. The right ventricular size is severely enlarged.  3. Left atrial size was severely dilated.  4. Right atrial size was severely dilated.  5. The mitral valve is normal in structure. Severe mitral valve regurgitation. No evidence of mitral stenosis.  6. Tricuspid valve regurgitation is severe.  7. The aortic valve is normal in structure. Aortic valve regurgitation is not visualized. No aortic stenosis is present.  8. Aortic dilatation noted. There is borderline dilatation of the aortic root, measuring 39 mm. There is borderline dilatation of the ascending aorta, measuring 38 mm.  9. The inferior vena cava is dilated in size with <50% respiratory variability, suggesting right atrial pressure of 15 mmHg.  09/20/2022 Limited Echo:  1. LIMITED ECHO - for more details refer to full echo from June 12, 2022.   2. Left ventricular ejection fraction, by estimation, is <20%. The left  ventricle has severely decreased function. The left ventricle demonstrates  global hypokinesis. The left ventricular internal cavity size was  dilated.  There is mild left ventricular  hypertrophy. Left ventricular diastolic function could not be evaluated.   3. RVSP is underestimated due to dilated RV and RA chambers. . Right  ventricular systolic function is severely reduced. The right ventricular  size is severely enlarged. There is mildly elevated pulmonary artery  systolic pressure. The estimated right  ventricular systolic pressure is 40.0 mmHg.   4. Left atrial size was mildly dilated.   5. Right atrial size was severely dilated.   6. The mitral valve is degenerative. Severe mitral valve regurgitation.  No evidence of mitral stenosis.   7. Tricuspid valve regurgitation is severe.   8. The aortic valve is grossly normal. Aortic valve regurgitation is not  visualized. No aortic stenosis is present.  9. Aortic proximal ascending aorta at upper limit of normal (37mm). There  is borderline dilatation of the aortic root, measuring 38 mm.  10. Dilated pulmonary artery.  11. The inferior vena cava is dilated in size with <50% respiratory  variability, suggesting right atrial pressure of 15 mmHg.  12. Rhythm strip during this exam demonstrates atrial fibrillation.   Heart catheterization: Left/right heart catheterization with coronary angiography: 09/21/2022 LM: Normal LAD: Normal RI: Normal Lcx: Normal RCA: Normal   RA: 5 mmHg RV: 32/5 mmHg PA: 27/4 mmHg, mPAP 22 mmHg PCW: Could not be obtained as catheter could not reach the wedge position (Patient is 6 ft 9 in).   LV 82/4 mmHg LVEDP 8 mmHg (Patient was given 500 cc saline bolus)   No coronary artery disease Compensated nonischemic cardiomyopathy Borderline pulmonary hypertension, WHO grp II Continue GDMT for heart failure and sinus rhythm restoration   Scheduled Meds:  amiodarone  200 mg Oral BID   apixaban  5 mg Oral BID   dapagliflozin propanediol  10 mg Oral Daily   ivabradine  7.5 mg Oral BID WC   midodrine  5 mg Oral TID WC   sodium chloride flush   3 mL Intravenous Q12H   sodium chloride flush  3 mL Intravenous Q12H    Continuous Infusions:  sodium chloride      PRN Meds: sodium chloride, acetaminophen **OR** acetaminophen, ondansetron **OR** ondansetron (ZOFRAN) IV, mouth rinse, senna-docusate, sodium chloride flush   IMPRESSION & RECOMMENDATIONS: Garrett Carroll is a 67 y.o. African-American male whose past medical history and cardiac risk factors include: Nonischemic cardiomyopathy, HFrEF, symptomatic atrial fibrillation, lung nodule, noncompliance.  Impression: Chronic biventricular heart failure, stage C, NYHA class II. Nonischemic cardiomyopathy, compensated based on right heart catheterization. Paroxysmal atrial fibrillation-status post direct-current cardioversion 09/22/2022 Long-term anticoagulation. Long-term antiarrhythmic medications. Ascending thoracic aortic aneurysm 4.1 cm (June 2024). Hypothyroidism. Medication/follow-up noncompliance  Plan: Chronic biventricular heart failure, stage C, NYHA class II. Nonischemic cardiomyopathy Symptomatically feels back to baseline. Net IO Since Admission: -5,709.16 mL [09/23/22 1308] Discharge medication recommendations: Farxiga 10 mg p.o. daily. Midodrine 5 mg p.o. 3 times daily. Metoprolol succinate 25 mg p.o. daily. Corlanor was not approved by his insurance-will need to discuss/request approval as outpatient Strict I's and O's, daily weights Shared my thoughts with the patient's wife that his overall prognosis is guarded given the biventricular heart failure.  It is very appropriate for them to start discussing goals of care.  Paroxysmal atrial fibrillation: Long-term anticoagulation. Long-term antiarrhythmic medications. Status post TEE guided cardioversion Discovered in March 2024 Rate control: Metoprolol succinate Rhythm control: Amiodarone. Thromboembolic prophylaxis: Eliquis. Underwent direct-current cardioversion and TEE guided on 09/22/2022 Remains in  normal sinus rhythm Check EKG Reviewed with signs importance of medication compliance especially anticoagulation as he is status post cardioversion with both patient and wife over the phone.  They understand that interruption in anticoagulation within the first 4 weeks increases the probability of thromboembolic event i.e. stroke.  Ascending thoracic aortic aneurysm: 41 mm 08 September 2022. Yearly follow-up recommended.  Elevated D-dimers: Has undergone CT PE protocol study as well as lower extremity venous duplex both were unremarkable for VTE.  Hypothyroidism: Currently managed by primary team.  Will arrange outpatient follow-up on Monday, 09/25/2022  Patient's questions and concerns were addressed to his satisfaction. He voices understanding of the instructions provided during this encounter.   This note was created using a voice recognition software as a result there may be grammatical errors inadvertently enclosed that do  not reflect the nature of this encounter. Every attempt is made to correct such errors.  Delilah Shan [facility]  Pager:  [phone] Office: [phone] 09/23/2022, 1:08 PM

## 2022-09-23 NOTE — Plan of Care (Signed)
  Problem: Education: Goal: Ability to demonstrate management of disease process will improve Outcome: Progressing Goal: Ability to verbalize understanding of medication therapies will improve Outcome: Progressing Goal: Individualized Educational Video(s) Outcome: Progressing   Problem: Activity: Goal: Capacity to carry out activities will improve Outcome: Progressing   Problem: Cardiac: Goal: Ability to achieve and maintain adequate cardiopulmonary perfusion will improve Outcome: Progressing   Problem: Education: Goal: Knowledge of disease or condition will improve Outcome: Progressing Goal: Understanding of medication regimen will improve Outcome: Progressing Goal: Individualized Educational Video(s) Outcome: Progressing   Problem: Activity: Goal: Ability to tolerate increased activity will improve Outcome: Progressing   Problem: Cardiac: Goal: Ability to achieve and maintain adequate cardiopulmonary perfusion will improve Outcome: Progressing   Problem: Health Behavior/Discharge Planning: Goal: Ability to safely manage health-related needs after discharge will improve Outcome: Progressing   Problem: Education: Goal: Knowledge of General Education information will improve Description: Including pain rating scale, medication(s)/side effects and non-pharmacologic comfort measures Outcome: Progressing   Problem: Health Behavior/Discharge Planning: Goal: Ability to manage health-related needs will improve Outcome: Progressing   Problem: Clinical Measurements: Goal: Ability to maintain clinical measurements within normal limits will improve Outcome: Progressing Goal: Will remain free from infection Outcome: Progressing Goal: Diagnostic test results will improve Outcome: Progressing Goal: Respiratory complications will improve Outcome: Progressing Goal: Cardiovascular complication will be avoided Outcome: Progressing   Problem: Activity: Goal: Risk for activity  intolerance will decrease Outcome: Progressing   Problem: Nutrition: Goal: Adequate nutrition will be maintained Outcome: Progressing   Problem: Coping: Goal: Level of anxiety will decrease Outcome: Progressing   Problem: Elimination: Goal: Will not experience complications related to bowel motility Outcome: Progressing Goal: Will not experience complications related to urinary retention Outcome: Progressing   Problem: Pain Managment: Goal: General experience of comfort will improve Outcome: Progressing   Problem: Safety: Goal: Ability to remain free from injury will improve Outcome: Progressing   Problem: Skin Integrity: Goal: Risk for impaired skin integrity will decrease Outcome: Progressing   Problem: Education: Goal: Understanding of CV disease, CV risk reduction, and recovery process will improve Outcome: Progressing Goal: Individualized Educational Video(s) Outcome: Progressing   Problem: Activity: Goal: Ability to return to baseline activity level will improve Outcome: Progressing   Problem: Cardiovascular: Goal: Ability to achieve and maintain adequate cardiovascular perfusion will improve Outcome: Progressing Goal: Vascular access site(s) Level 0-1 will be maintained Outcome: Progressing   Problem: Health Behavior/Discharge Planning: Goal: Ability to safely manage health-related needs after discharge will improve Outcome: Progressing   

## 2022-09-23 NOTE — Plan of Care (Signed)
Problem: Education: Goal: Ability to demonstrate management of disease process will improve 09/23/2022 1103 by Herma Carson, RN Outcome: Adequate for Discharge 09/23/2022 4098 by Herma Carson, RN Outcome: Progressing Goal: Ability to verbalize understanding of medication therapies will improve 09/23/2022 1103 by Herma Carson, RN Outcome: Adequate for Discharge 09/23/2022 1191 by Herma Carson, RN Outcome: Progressing Goal: Individualized Educational Video(s) 09/23/2022 1103 by Herma Carson, RN Outcome: Adequate for Discharge 09/23/2022 4782 by Herma Carson, RN Outcome: Progressing   Problem: Activity: Goal: Capacity to carry out activities will improve 09/23/2022 1103 by Herma Carson, RN Outcome: Adequate for Discharge 09/23/2022 9562 by Herma Carson, RN Outcome: Progressing   Problem: Cardiac: Goal: Ability to achieve and maintain adequate cardiopulmonary perfusion will improve 09/23/2022 1103 by Herma Carson, RN Outcome: Adequate for Discharge 09/23/2022 1308 by Herma Carson, RN Outcome: Progressing   Problem: Education: Goal: Knowledge of disease or condition will improve 09/23/2022 1103 by Herma Carson, RN Outcome: Adequate for Discharge 09/23/2022 6578 by Herma Carson, RN Outcome: Progressing Goal: Understanding of medication regimen will improve 09/23/2022 1103 by Herma Carson, RN Outcome: Adequate for Discharge 09/23/2022 4696 by Herma Carson, RN Outcome: Progressing Goal: Individualized Educational Video(s) 09/23/2022 1103 by Herma Carson, RN Outcome: Adequate for Discharge 09/23/2022 2952 by Herma Carson, RN Outcome: Progressing   Problem: Activity: Goal: Ability to tolerate increased activity will improve 09/23/2022 1103 by Herma Carson, RN Outcome: Adequate for Discharge 09/23/2022 8413 by Herma Carson, RN Outcome: Progressing   Problem: Cardiac: Goal: Ability to achieve and maintain adequate cardiopulmonary  perfusion will improve 09/23/2022 1103 by Herma Carson, RN Outcome: Adequate for Discharge 09/23/2022 2440 by Herma Carson, RN Outcome: Progressing   Problem: Health Behavior/Discharge Planning: Goal: Ability to safely manage health-related needs after discharge will improve 09/23/2022 1103 by Herma Carson, RN Outcome: Adequate for Discharge 09/23/2022 1027 by Herma Carson, RN Outcome: Progressing   Problem: Education: Goal: Knowledge of General Education information will improve Description: Including pain rating scale, medication(s)/side effects and non-pharmacologic comfort measures 09/23/2022 1103 by Herma Carson, RN Outcome: Adequate for Discharge 09/23/2022 2536 by Herma Carson, RN Outcome: Progressing   Problem: Health Behavior/Discharge Planning: Goal: Ability to manage health-related needs will improve 09/23/2022 1103 by Herma Carson, RN Outcome: Adequate for Discharge 09/23/2022 6440 by Herma Carson, RN Outcome: Progressing   Problem: Clinical Measurements: Goal: Ability to maintain clinical measurements within normal limits will improve 09/23/2022 1103 by Herma Carson, RN Outcome: Adequate for Discharge 09/23/2022 3474 by Herma Carson, RN Outcome: Progressing Goal: Will remain free from infection 09/23/2022 1103 by Herma Carson, RN Outcome: Adequate for Discharge 09/23/2022 2595 by Herma Carson, RN Outcome: Progressing Goal: Diagnostic test results will improve 09/23/2022 1103 by Herma Carson, RN Outcome: Adequate for Discharge 09/23/2022 6387 by Herma Carson, RN Outcome: Progressing Goal: Respiratory complications will improve 09/23/2022 1103 by Herma Carson, RN Outcome: Adequate for Discharge 09/23/2022 5643 by Herma Carson, RN Outcome: Progressing Goal: Cardiovascular complication will be avoided 09/23/2022 1103 by Herma Carson, RN Outcome: Adequate for Discharge 09/23/2022 3295 by Herma Carson, RN Outcome:  Progressing   Problem: Activity: Goal: Risk for activity intolerance will decrease 09/23/2022 1103 by Herma Carson, RN Outcome: Adequate for Discharge 09/23/2022 1884 by Herma Carson, RN Outcome: Progressing   Problem: Nutrition: Goal: Adequate nutrition will be maintained 09/23/2022  1103 by Herma Carson, RN Outcome: Adequate for Discharge 09/23/2022 [phone] by Herma Carson, RN Outcome: Progressing   Problem: Coping: Goal: Level of anxiety will decrease 09/23/2022 1103 by Herma Carson, RN Outcome: Adequate for Discharge 09/23/2022 9604 by Herma Carson, RN Outcome: Progressing   Problem: Elimination: Goal: Will not experience complications related to bowel motility 09/23/2022 1103 by Herma Carson, RN Outcome: Adequate for Discharge 09/23/2022 5409 by Herma Carson, RN Outcome: Progressing Goal: Will not experience complications related to urinary retention 09/23/2022 1103 by Herma Carson, RN Outcome: Adequate for Discharge 09/23/2022 8119 by Herma Carson, RN Outcome: Progressing   Problem: Pain Managment: Goal: General experience of comfort will improve 09/23/2022 1103 by Herma Carson, RN Outcome: Adequate for Discharge 09/23/2022 1478 by Herma Carson, RN Outcome: Progressing   Problem: Safety: Goal: Ability to remain free from injury will improve 09/23/2022 1103 by Herma Carson, RN Outcome: Adequate for Discharge 09/23/2022 2956 by Herma Carson, RN Outcome: Progressing   Problem: Skin Integrity: Goal: Risk for impaired skin integrity will decrease 09/23/2022 1103 by Herma Carson, RN Outcome: Adequate for Discharge 09/23/2022 2130 by Herma Carson, RN Outcome: Progressing   Problem: Education: Goal: Understanding of CV disease, CV risk reduction, and recovery process will improve 09/23/2022 1103 by Herma Carson, RN Outcome: Adequate for Discharge 09/23/2022 8657 by Herma Carson, RN Outcome: Progressing Goal: Individualized  Educational Video(s) 09/23/2022 1103 by Herma Carson, RN Outcome: Adequate for Discharge 09/23/2022 8469 by Herma Carson, RN Outcome: Progressing   Problem: Activity: Goal: Ability to return to baseline activity level will improve 09/23/2022 1103 by Herma Carson, RN Outcome: Adequate for Discharge 09/23/2022 6295 by Herma Carson, RN Outcome: Progressing   Problem: Cardiovascular: Goal: Ability to achieve and maintain adequate cardiovascular perfusion will improve 09/23/2022 1103 by Herma Carson, RN Outcome: Adequate for Discharge 09/23/2022 2841 by Herma Carson, RN Outcome: Progressing Goal: Vascular access site(s) Level 0-1 will be maintained 09/23/2022 1103 by Herma Carson, RN Outcome: Adequate for Discharge 09/23/2022 3244 by Herma Carson, RN Outcome: Progressing   Problem: Health Behavior/Discharge Planning: Goal: Ability to safely manage health-related needs after discharge will improve 09/23/2022 1103 by Herma Carson, RN Outcome: Adequate for Discharge 09/23/2022 0102 by Herma Carson, RN Outcome: Progressing

## 2022-09-24 LAB — LIPOPROTEIN A (LPA): Lipoprotein (a): 77.9 nmol/L — ABNORMAL HIGH (ref <75.0)

## 2022-09-24 LAB — CULTURE, BLOOD (ROUTINE X 2): Culture: NO GROWTH

## 2022-09-25 ENCOUNTER — Encounter (HOSPITAL_COMMUNITY): Payer: Self-pay | Admitting: Cardiology

## 2022-10-06 ENCOUNTER — Ambulatory Visit: Payer: BC Managed Care – PPO | Admitting: Cardiology

## 2022-10-22 ENCOUNTER — Other Ambulatory Visit: Payer: Self-pay | Admitting: Cardiology

## 2022-10-23 ENCOUNTER — Other Ambulatory Visit (HOSPITAL_COMMUNITY): Payer: Self-pay

## 2022-10-25 ENCOUNTER — Other Ambulatory Visit: Payer: Self-pay

## 2022-10-26 ENCOUNTER — Ambulatory Visit: Payer: BC Managed Care – PPO | Admitting: Cardiology

## 2022-10-26 ENCOUNTER — Other Ambulatory Visit: Payer: Self-pay

## 2022-10-26 MED ORDER — DAPAGLIFLOZIN PROPANEDIOL 10 MG PO TABS: 10.0000 mg | ORAL_TABLET | Freq: Every day | ORAL | 0 refills | Status: DC

## 2022-10-27 ENCOUNTER — Other Ambulatory Visit (HOSPITAL_COMMUNITY): Payer: Self-pay

## 2022-10-30 ENCOUNTER — Other Ambulatory Visit: Payer: Self-pay

## 2022-10-30 MED ORDER — MIDODRINE HCL 5 MG PO TABS: 5.0000 mg | ORAL_TABLET | Freq: Two times a day (BID) | ORAL | 0 refills | Status: DC

## 2022-10-30 MED ORDER — ENTRESTO 24-26 MG PO TABS: 1.0000 | ORAL_TABLET | Freq: Two times a day (BID) | ORAL | 3 refills | Status: DC

## 2022-11-10 ENCOUNTER — Encounter: Payer: Self-pay | Admitting: Cardiology

## 2022-11-10 ENCOUNTER — Ambulatory Visit: Payer: BC Managed Care – PPO | Admitting: Cardiology

## 2022-11-10 VITALS — BP 104/59 | HR 52 | Resp 16 | Ht >= 80 in | Wt 181.2 lb

## 2022-11-10 DIAGNOSIS — I7121 Aneurysm of the ascending aorta, without rupture: Secondary | ICD-10-CM

## 2022-11-10 DIAGNOSIS — Z9289 Personal history of other medical treatment: Secondary | ICD-10-CM

## 2022-11-10 DIAGNOSIS — I480 Paroxysmal atrial fibrillation: Secondary | ICD-10-CM

## 2022-11-10 DIAGNOSIS — Z7901 Long term (current) use of anticoagulants: Secondary | ICD-10-CM

## 2022-11-10 DIAGNOSIS — I5022 Chronic systolic (congestive) heart failure: Secondary | ICD-10-CM

## 2022-11-10 DIAGNOSIS — Z79899 Other long term (current) drug therapy: Secondary | ICD-10-CM

## 2022-11-10 DIAGNOSIS — I428 Other cardiomyopathies: Secondary | ICD-10-CM

## 2022-11-10 MED ORDER — METOPROLOL SUCCINATE ER 25 MG PO TB24: 25.0000 mg | ORAL_TABLET | Freq: Every morning | ORAL | 0 refills | Status: DC

## 2022-11-10 MED ORDER — MULTAQ 400 MG PO TABS: 400.0000 mg | ORAL_TABLET | Freq: Two times a day (BID) | ORAL | 0 refills | Status: DC

## 2022-11-10 NOTE — Progress Notes (Signed)
ID:  Garrett Carroll, DOB [birth year], MRN [id number]  PCP:  Mirna Mires, MD  Cardiologist:  Tessa Lerner, DO, [facility] (established care 06/11/2022)  Date: 11/10/22 Last Office Visit: 09/19/2022  Chief Complaint  Patient presents with   Follow-up   Congestive Heart Failure   Atrial Fibrillation    HPI  Garrett Carroll is a 67 y.o. African-American male whose past medical history and cardiovascular risk factors include: Nonischemic cardiomyopathy, chronic HFrEF, paroxysmal atrial fibrillation status post direct-current cardioversion June 2022 for, ascending aortic aneurysm 41 mm, hypothyroidism, history of medication noncompliance.  The last office visit in June 2024 patient was in acute decompensated heart failure given his severely reduced LVEF and A-fib.  He was hospitalized for symptom management and uptitration of GDMT and undergo TEE guided cardioversion plus or minus heart catheterization.  During his hospitalization in June 2024 patient underwent left and right heart catheterization which reassured his nonischemic cardiomyopathy was compensated after diuresis.  TEE guided cardioversion restored normal sinus rhythm.  He was started on amiodarone, metoprolol, and Eliquis for thromboembolic prophylaxis.  He was supposed to come back for follow-up on 10/06/2022 but it was canceled.  Since last hospitalization patient states that he is doing well from a cardiovascular standpoint.  He denies anginal chest pain or heart failure symptoms.  He is compliant with current medical therapy he brings his bottles in for review.  His discharge weight 179 pounds and today's weight is 181 pounds.  ALLERGIES: No Known Allergies  MEDICATION LIST PRIOR TO VISIT: Current Meds  Medication Sig   dapagliflozin propanediol (FARXIGA) 10 MG TABS tablet Take 1 tablet (10 mg total) by mouth daily.   [START ON 11/16/2022] dronedarone (MULTAQ) 400 MG tablet Take 1 tablet (400 mg total) by mouth 2 (two) times daily  with a meal.   ELIQUIS 5 MG TABS tablet TAKE 1 TABLET BY MOUTH TWICE A DAY   furosemide (LASIX) 20 MG tablet Take 20 mg by mouth daily as needed for fluid or edema (weight increase by 3 pounds over one day or 5 pounds over week.).   metoprolol succinate (TOPROL XL) 25 MG 24 hr tablet Take 1 tablet (25 mg total) by mouth in the morning. Hold if systolic blood pressure (top number) less than 100 mmHg or pulse less than 55 bpm.   midodrine (PROAMATINE) 5 MG tablet Take 1 tablet (5 mg total) by mouth 2 (two) times daily with a meal.   sacubitril-valsartan (ENTRESTO) 24-26 MG Take 1 tablet by mouth 2 (two) times daily.   [DISCONTINUED] amiodarone (PACERONE) 200 MG tablet Take 1 tablet (200 mg total) by mouth 2 (two) times daily.   [DISCONTINUED] metoprolol succinate (TOPROL-XL) 50 MG 24 hr tablet Take 50 mg by mouth daily.     PAST MEDICAL HISTORY: Past Medical History:  Diagnosis Date   Atrial fibrillation (HCC)    Cardiomyopathy (HCC)    CHF (congestive heart failure) (HCC)     PAST SURGICAL HISTORY: Past Surgical History:  Procedure Laterality Date   CARDIOVERSION N/A 09/22/2022   Procedure: CARDIOVERSION;  Surgeon: Yates Decamp, MD;  Location: [facility] INVASIVE CV LAB;  Service: Cardiovascular;  Laterality: N/A;   RIGHT/LEFT HEART CATH AND CORONARY ANGIOGRAPHY N/A 09/21/2022   Procedure: RIGHT/LEFT HEART CATH AND CORONARY ANGIOGRAPHY;  Surgeon: Elder Negus, MD;  Location: [facility] INVASIVE CV LAB;  Service: Cardiovascular;  Laterality: N/A;   TEE WITHOUT CARDIOVERSION N/A 09/22/2022   Procedure: TRANSESOPHAGEAL ECHOCARDIOGRAM;  Surgeon: Yates Decamp, MD;  Location: [facility] INVASIVE CV  LAB;  Service: Cardiovascular;  Laterality: N/A;    FAMILY HISTORY: The patient family history includes Breast cancer in his mother; Stroke in his brother.  SOCIAL HISTORY:  The patient  reports that he has never smoked. He does not have any smokeless tobacco history on file. He reports current alcohol use. He reports  that he does not currently use drugs after having used the following drugs: Marijuana and Cocaine.  REVIEW OF SYSTEMS: Review of Systems  Cardiovascular:  Negative for chest pain, claudication, dyspnea on exertion, irregular heartbeat, leg swelling, near-syncope, orthopnea, palpitations, paroxysmal nocturnal dyspnea and syncope.  Respiratory:  Negative for shortness of breath.   Hematologic/Lymphatic: Negative for bleeding problem.  Musculoskeletal:  Negative for muscle cramps and myalgias.  Neurological:  Negative for dizziness and light-headedness.    PHYSICAL EXAM: Today's Vitals   11/10/22 1437  BP: (!) 104/59  Pulse: (!) 52  Resp: 16  SpO2: 98%  Weight: 181 lb 3.2 oz (82.2 kg)  Height: 6\' 9"  (2.057 m)   Body mass index is 19.42 kg/m.  Physical Exam  Constitutional: No distress.  Age appropriate, hemodynamically stable.   Neck: No JVD present.  Cardiovascular: Regular rhythm, S1 normal and S2 normal. Bradycardia present. Exam reveals no gallop, no S3 and no S4.  Murmur heard. Holosystolic murmur is present with a grade of 3/6. Pulmonary/Chest: Effort normal and breath sounds normal. No stridor. He has no wheezes. He has no rales.  Abdominal: Soft. Bowel sounds are normal. He exhibits no distension. There is no abdominal tenderness.  Musculoskeletal:        General: Edema (Trace bilateral) present.     Cervical back: Neck supple.  Neurological: He is alert and oriented to person, place, and time. He has intact cranial nerves (2-12).  Skin: Skin is warm and moist.   CARDIAC DATABASE: TEE guided direct-current cardioversion: 09/22/2022: No LA or left atrial appendage thrombus.  Status post 150 J x 1  EKG: 11/10/2022: Sinus bradycardia, 42 bpm, normal axis, without underlying ischemia or injury pattern.  Prior EKG 09/19/2022 noted A-fib with controlled ventricular rate  Echocardiogram: 06/12/2022:   1. Left ventricular ejection fraction, by estimation, is 20 to 25%. The  left ventricle has severely decreased function. The left ventricle demonstrates global hypokinesis. The left ventricular internal cavity size was moderately dilated. Left  ventricular diastolic function could not be evaluated.  2. Right ventricular systolic function is severely reduced. The right ventricular size is severely enlarged.  3. Left atrial size was severely dilated.  4. Right atrial size was severely dilated.  5. The mitral valve is normal in structure. Severe mitral valve regurgitation. No evidence of mitral stenosis.  6. Tricuspid valve regurgitation is severe.  7. The aortic valve is normal in structure. Aortic valve regurgitation is not visualized. No aortic stenosis is present.  8. Aortic dilatation noted. There is borderline dilatation of the aortic root, measuring 39 mm. There is borderline dilatation of the ascending aorta, measuring 38 mm.  9. The inferior vena cava is dilated in size with <50% respiratory variability, suggesting right atrial pressure of 15 mmHg.  09/20/2022 Limited Echo:  1. LIMITED ECHO - for more details refer to full echo from June 12, 2022.   2. Left ventricular ejection fraction, by estimation, is <20%. The left  ventricle has severely decreased function. The left ventricle demonstrates  global hypokinesis. The left ventricular internal cavity size was dilated.  There is mild left ventricular  hypertrophy. Left ventricular diastolic function  could not be evaluated.   3. RVSP is underestimated due to dilated RV and RA chambers. . Right  ventricular systolic function is severely reduced. The right ventricular  size is severely enlarged. There is mildly elevated pulmonary artery  systolic pressure. The estimated right  ventricular systolic pressure is 40.0 mmHg.   4. Left atrial size was mildly dilated.   5. Right atrial size was severely dilated.   6. The mitral valve is degenerative. Severe mitral valve regurgitation.  No evidence of mitral  stenosis.   7. Tricuspid valve regurgitation is severe.   8. The aortic valve is grossly normal. Aortic valve regurgitation is not  visualized. No aortic stenosis is present.   9. Aortic proximal ascending aorta at upper limit of normal (37mm). There  is borderline dilatation of the aortic root, measuring 38 mm.  10. Dilated pulmonary artery.  11. The inferior vena cava is dilated in size with <50% respiratory  variability, suggesting right atrial pressure of 15 mmHg.  12. Rhythm strip during this exam demonstrates atrial fibrillation.    Heart Catheterization: Left/right heart catheterization with coronary angiography: 09/21/2022 LM: Normal LAD: Normal RI: Normal Lcx: Normal RCA: Normal   RA: 5 mmHg RV: 32/5 mmHg PA: 27/4 mmHg, mPAP 22 mmHg PCW: Could not be obtained as catheter could not reach the wedge position (Patient is 6 ft 9 in).   LV 82/4 mmHg LVEDP 8 mmHg (Patient was given 500 cc saline bolus)   No coronary artery disease Compensated nonischemic cardiomyopathy Borderline pulmonary hypertension, WHO grp II Continue GDMT for heart failure and sinus rhythm restoration  LABORATORY DATA:    Latest Ref Rng & Units 09/22/2022    2:48 AM 09/21/2022    8:11 AM 09/21/2022    8:06 AM  CBC  WBC 4.0 - 10.5 K/uL 4.2     Hemoglobin 13.0 - 17.0 g/dL 44.0  10.2  72.5   Hematocrit 39.0 - 52.0 % 37.9  37.0  37.0   Platelets 150 - 400 K/uL 108          Latest Ref Rng & Units 09/23/2022    2:53 AM 09/22/2022    2:48 AM 09/21/2022    8:11 AM  CMP  Glucose 70 - 99 mg/dL 88  94    BUN 8 - 23 mg/dL 13  12    Creatinine 3.66 - 1.24 mg/dL 4.40  3.47    Sodium 425 - 145 mmol/L 135  136  141   Potassium 3.5 - 5.1 mmol/L 4.0  3.9  3.6   Chloride 98 - 111 mmol/L 103  103    CO2 22 - 32 mmol/L 22  24    Calcium 8.9 - 10.3 mg/dL 8.4  8.3      Lipid Panel     Component Value Date/Time   CHOL 121 06/12/2022 0556   TRIG 32 06/12/2022 0556   HDL 54 06/12/2022 0556   CHOLHDL 2.2  06/12/2022 0556   VLDL 6 06/12/2022 0556   LDLCALC 61 06/12/2022 0556   LDLDIRECT 62 06/12/2022 0556    No components found for: "NTPROBNP" Recent Labs    06/26/22 1133  PROBNP 2,812*   Recent Labs    06/11/22 1524 09/20/22 0040  TSH 2.376 16.790*    BMP Recent Labs    09/21/22 0222 09/21/22 0801 09/21/22 0811 09/22/22 0248 09/23/22 0253  NA 140   < > 141 136 135  K 4.0   < > 3.6 3.9 4.0  CL  106  --   --  103 103  CO2 24  --   --  24 22  GLUCOSE 94  --   --  94 88  BUN 12  --   --  12 13  CREATININE 1.37*  --   --  1.36* 1.29*  CALCIUM 8.6*  --   --  8.3* 8.4*  GFRNONAA 57*  --   --  58* >60   < > = values in this interval not displayed.    HEMOGLOBIN A1C Lab Results  Component Value Date   HGBA1C 5.8 (H) 06/12/2022   MPG 120 06/12/2022    IMPRESSION:    ICD-10-CM   1. Chronic HFrEF (heart failure with reduced ejection fraction) (HCC)  I50.22 EKG 12-Lead    2. Paroxysmal atrial fibrillation (HCC)  I48.0 metoprolol succinate (TOPROL XL) 25 MG 24 hr tablet    dronedarone (MULTAQ) 400 MG tablet    3. Long term current use of antiarrhythmic drug  Z79.899 metoprolol succinate (TOPROL XL) 25 MG 24 hr tablet    dronedarone (MULTAQ) 400 MG tablet    4. Long term (current) use of anticoagulants  Z79.01     5. History of cardioversion  Z92.89     6. Nonischemic cardiomyopathy (HCC)  I42.8     7. Aneurysm of ascending aorta without rupture (HCC)  I71.21         RECOMMENDATIONS: Kinston Remmick is a 67 y.o. African-American male whose past medical history and cardiac risk factors include: Nonischemic cardiomyopathy, chronic HFrEF, paroxysmal atrial fibrillation status post direct-current cardioversion June 2022 for, ascending aortic aneurysm 41 mm, hypothyroidism, history of medication noncompliance.  Chronic HFrEF (heart failure with reduced ejection fraction) (HCC) Nonischemic cardiomyopathy (HCC) Stage C, NYHA class II. Last hospitalization June 2024,  discharge weight 179 pounds, was diuresed net negative urine output of 5.7 L Weight today is 181 pounds. Medications reconciled. His GDMT can be titrated but to avoid confusion we will continue the current medications for now. Given the fact he still requiring midodrine-at the next visit we will consider transitioning him from [facility] to low-dose ARB and spironolactone.  Paroxysmal atrial fibrillation (HCC) Long term current use of antiarrhythmic drug Long term (current) use of anticoagulants History of cardioversion Rate control: Metoprolol. Rhythm control: Amiodarone discontinue and start Multaq 5 days later Thromboembolic prophylaxis: Eliquis TEE guided cardioversion 09/22/2022 converted to sinus rhythm  Due to bradycardia we will reduce Toprol-XL from 50 mg p.o. daily to 25 mg p.o. daily Discontinue amiodarone 11/10/2022 Will start Multaq 400 mg p.o. twice daily-initiate 5 days later on 11/16/2022.  Patient's been provided co-pay card as well as patient assistance form  Aneurysm of ascending aorta without rupture [facility]) Echo June 2024: Proximal ascending aorta 37 mm CTA PE protocol June 2024: 41 mm-recommend annual imaging follow-up by CTA or MRA Will order at the next office visit.  Since patient is not the best historian.  After the office visit I spoke to his wife Ms. Andrey Campanile who states that the patient is doing well since hospital discharge.  Medication changes are mentioned above were conveyed to her as well.  I have requested that she accompanies him at the office visits to answer any questions that he may have and to make sure that the recommendations are conveyed and understood.  Patient's wife was very thankful for the call to close the loop.   FINAL MEDICATION LIST END OF ENCOUNTER: Meds ordered this encounter  Medications   metoprolol succinate (  TOPROL XL) 25 MG 24 hr tablet    Sig: Take 1 tablet (25 mg total) by mouth in the morning. Hold if systolic blood pressure (top  number) less than 100 mmHg or pulse less than 55 bpm.    Dispense:  90 tablet    Refill:  0   dronedarone (MULTAQ) 400 MG tablet    Sig: Take 1 tablet (400 mg total) by mouth 2 (two) times daily with a meal.    Dispense:  180 tablet    Refill:  0    Medications Discontinued During This Encounter  Medication Reason   metoprolol succinate (TOPROL-XL) 50 MG 24 hr tablet Dose change   amiodarone (PACERONE) 200 MG tablet Change in therapy      Current Outpatient Medications:    dapagliflozin propanediol (FARXIGA) 10 MG TABS tablet, Take 1 tablet (10 mg total) by mouth daily., Disp: 30 tablet, Rfl: 0   [START ON 11/16/2022] dronedarone (MULTAQ) 400 MG tablet, Take 1 tablet (400 mg total) by mouth 2 (two) times daily with a meal., Disp: 180 tablet, Rfl: 0   ELIQUIS 5 MG TABS tablet, TAKE 1 TABLET BY MOUTH TWICE A DAY, Disp: 180 tablet, Rfl: 0   furosemide (LASIX) 20 MG tablet, Take 20 mg by mouth daily as needed for fluid or edema (weight increase by 3 pounds over one day or 5 pounds over week.)., Disp: , Rfl:    metoprolol succinate (TOPROL XL) 25 MG 24 hr tablet, Take 1 tablet (25 mg total) by mouth in the morning. Hold if systolic blood pressure (top number) less than 100 mmHg or pulse less than 55 bpm., Disp: 90 tablet, Rfl: 0   midodrine (PROAMATINE) 5 MG tablet, Take 1 tablet (5 mg total) by mouth 2 (two) times daily with a meal., Disp: 60 tablet, Rfl: 0   sacubitril-valsartan (ENTRESTO) 24-26 MG, Take 1 tablet by mouth 2 (two) times daily., Disp: 60 tablet, Rfl: 3  Orders Placed This Encounter  Procedures   EKG 12-Lead    There are no Patient Instructions on file for this visit.   --Continue cardiac medications as reconciled in final medication list. --Return in about 8 weeks (around 01/02/2023) for Follow up, heart failure management., A. fib. or sooner if needed. --Continue follow-up with your primary care physician regarding the management of your other chronic comorbid  conditions.  Patient's questions and concerns were addressed to his satisfaction. He voices understanding of the instructions provided during this encounter.   This note was created using a voice recognition software as a result there may be grammatical errors inadvertently enclosed that do not reflect the nature of this encounter. Every attempt is made to correct such errors.  Tessa Lerner, [place], [facility]  Pager:  [phone] Office: [phone]

## 2022-11-12 NOTE — Progress Notes (Signed)
Patient had cancelled this appt.   No charge.   Veleka Djordjevic [place], DO, [facility]

## 2022-11-23 ENCOUNTER — Other Ambulatory Visit: Payer: Self-pay | Admitting: Cardiology

## 2022-11-26 ENCOUNTER — Other Ambulatory Visit: Payer: Self-pay | Admitting: Cardiology

## 2022-12-28 ENCOUNTER — Other Ambulatory Visit: Payer: Self-pay | Admitting: Cardiology

## 2022-12-31 ENCOUNTER — Other Ambulatory Visit: Payer: Self-pay | Admitting: Cardiology

## 2023-01-01 ENCOUNTER — Other Ambulatory Visit: Payer: Self-pay

## 2023-01-01 MED ORDER — DAPAGLIFLOZIN PROPANEDIOL 10 MG PO TABS: 10.0000 mg | ORAL_TABLET | Freq: Every day | ORAL | 11 refills | Status: DC

## 2023-01-01 NOTE — Telephone Encounter (Signed)
Pt's medication was sent to pt's pharmacy as requested. Confirmation received.  °

## 2023-01-05 ENCOUNTER — Ambulatory Visit: Payer: BC Managed Care – PPO | Admitting: Cardiology

## 2023-01-11 ENCOUNTER — Telehealth (INDEPENDENT_AMBULATORY_CARE_PROVIDER_SITE_OTHER): Payer: Self-pay

## 2023-01-11 NOTE — H&P (View-Only) (Signed)
Cardiology Office Note:  .   Date:  01/12/2023  ID:  Garrett Carroll, DOB 20-Aug-1955, MRN [id number] PCP: Mirna Mires, MD  [facility] Providers Cardiologist:  Tessa Lerner, DO    History of Present Illness: .   Garrett Carroll is a 67 y.o. male with a history of HFrEF/NICM, PAF s/p DCCV 09/2022, ascending aortic aneurysm 41mm, hypothyroidism, thrombocytopenia.   Admitted 06/2022 with atrial fibrillation with RVR, declined cardioversion and LHC at that time. Echo 06/2022 LVEF 20-25%, RVSF severely reduced, severe MR.   Seen by Dr. Odis Hollingshead 09/2022 with acute decompensated heart failure and atrial fibrillation. Subsequently admitted 6/18-6/22/24 with workup including: Echo LVE <20%, LV global hypokinesis, RVSF severely reduced, mildly elevated PASP, severe MR, ascending aorta 37mm and aortic root 38mm.  LHC no CAD, started on IV amiodarone, 09/21/22 TEE/DCCV. He was diuresed 5.7 L and was recommended for palliative care which he declined.   Last seen by Dr. Odis Hollingshead 11/10/22 doing well from a cardiac standpoint NYHA II symptoms and weight gain of 3 lbs from discharge. Toprol reduced to 25mg  due to bradycardia. Amiodarone stopped and Multaq 400mg  BID initiated. Patient assistance paperwork provided.   He presents today for follow up.  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 Over the last 3 days recurrent heart racing, lightheadedness. No near syncope, syncope, chest pain, edema, orthopnea. He does note he missed a few days of Farxiga as picked up refill late and symptoms started after resuming. Feels similar to when he was admitted 06/2022 and 09/2022.  Has had issues with sinuses. Feels congested in his head, hearing ringing sound. Notes stopped up nose and difficulty breathing through his nose. The ringing in the ears is new and occurred after his PCP used a metal instrument to get wax out of his ears. The sinus congestion has been ongoing  for some time - awaiting appointment with ENT.  ROS: Please see the history of present illness.    All other systems reviewed and are negative.   Studies Reviewed: Marland Kitchen   EKG Interpretation Date/Time:  Friday January 12 2023 11:27:50 EDT Ventricular Rate:  127 PR Interval:    QRS Duration:  84 QT Interval:  284 QTC Calculation: 412 R Axis:   4  Text Interpretation: Atrial flutter with variable A-V block Minimal voltage criteria for LVH, may be normal variant ( Sokolow-Lyon ) Confirmed by Gillian Shields ([id number]) on 01/12/2023 12:11:10 PM    Cardiac Studies & Procedures   CARDIAC CATHETERIZATION  CARDIAC CATHETERIZATION 09/21/2022  Narrative LM: Normal LAD: Normal RI: Normal Lcx: Normal RCA: Normal  RA: 5 mmHg RV: 32/5 mmHg PA: 27/4 mmHg, mPAP 22 mmHg PCW: Could not be obtained as catheter could not reach the wedge position (Patient is 6 ft 9 in).  LV 82/4 mmHg LVEDP 8 mmHg (Patient was given 500 cc saline bolus)  No coronary artery disease Compensated nonischemic cardiomyopathy Borderline pulmonary hypertension, WHO grp II Continue GDMT for heart failure and sinus rhythm restoration Okay to resume OAC this afternoon  Elder Negus, MD Pager: [phone] Office: [phone]  Findings Coronary Findings Diagnostic  Dominance: Right  No diagnostic findings have been documented. Intervention  No interventions have been documented.     ECHOCARDIOGRAM  ECHOCARDIOGRAM LIMITED 09/20/2022  Narrative ECHOCARDIOGRAM LIMITED REPORT    Patient Name:   Garrett Carroll Date of Exam: 09/20/2022 Medical Rec #:  [id number]      Height:  81.0 in Accession #:    [id number]     Weight:       198.0 lb Date of Birth:  1956-02-14      BSA:          2.310 m Patient Age:    66 years       BP:           89/69 mmHg Patient Gender: M              HR:           102 bpm. Exam Location:  Inpatient  Procedure: Limited Echo, Cardiac Doppler and Color  Doppler  Indications:     CHF  History:         Patient has prior history of Echocardiogram examinations, most recent 06/12/2022. CHF and Cardiomyopathy, Mitral Valve Disease; Arrythmias:Atrial Fibrillation.  Sonographer:     Darlys Gales Referring Phys:  [id number] SUNIT TOLIA Diagnosing Phys: Tessa Lerner DO  IMPRESSIONS   1. LIMITED ECHO - for more details refer to full echo from June 12, 2022. 2. Left ventricular ejection fraction, by estimation, is <20%. The left ventricle has severely decreased function. The left ventricle demonstrates global hypokinesis. The left ventricular internal cavity size was dilated. There is mild left ventricular hypertrophy. Left ventricular diastolic function could not be evaluated. 3. RVSP is underestimated due to dilated RV and RA chambers. . Right ventricular systolic function is severely reduced. The right ventricular size is severely enlarged. There is mildly elevated pulmonary artery systolic pressure. The estimated right ventricular systolic pressure is 40.0 mmHg. 4. Left atrial size was mildly dilated. 5. Right atrial size was severely dilated. 6. The mitral valve is degenerative. Severe mitral valve regurgitation. No evidence of mitral stenosis. 7. Tricuspid valve regurgitation is severe. 8. The aortic valve is grossly normal. Aortic valve regurgitation is not visualized. No aortic stenosis is present. 9. Aortic proximal ascending aorta at upper limit of normal (37mm). There is borderline dilatation of the aortic root, measuring 38 mm. 10. Dilated pulmonary artery. 11. The inferior vena cava is dilated in size with <50% respiratory variability, suggesting right atrial pressure of 15 mmHg. 12. Rhythm strip during this exam demonstrates atrial fibrillation.  Comparison(s): A prior study was performed on 06/12/2022. No significant change.  Conclusion(s)/Recommendation(s): No left ventricular mural or apical thrombus/thrombi.  FINDINGS Left  Ventricle: Left ventricular ejection fraction, by estimation, is <20%. The left ventricle has severely decreased function. The left ventricle demonstrates global hypokinesis. Definity contrast agent was given IV to delineate the left ventricular endocardial borders. The left ventricular internal cavity size was dilated. There is mild left ventricular hypertrophy. Left ventricular diastolic function could not be evaluated. Left ventricular diastolic function could not be evaluated due to atrial fibrillation.  Right Ventricle: RVSP is underestimated due to dilated RV and RA chambers. The right ventricular size is severely enlarged. Right ventricular systolic function is severely reduced. There is mildly elevated pulmonary artery systolic pressure. The tricuspid regurgitant velocity is 2.50 m/s, and with an assumed right atrial pressure of 15 mmHg, the estimated right ventricular systolic pressure is 40.0 mmHg.  Left Atrium: Left atrial size was mildly dilated.  Right Atrium: Right atrial size was severely dilated.  Pericardium: There is no evidence of pericardial effusion.  Mitral Valve: The mitral valve is degenerative in appearance. Severe mitral valve regurgitation, with centrally-directed jet. No evidence of mitral valve stenosis.  Tricuspid Valve: The tricuspid valve is grossly normal. Tricuspid valve regurgitation is severe. No  evidence of tricuspid stenosis.  Aortic Valve: The aortic valve is grossly normal. Aortic valve regurgitation is not visualized. No aortic stenosis is present.  Pulmonic Valve: The pulmonic valve was grossly normal.  Aorta: Proximal ascending aorta at upper limit of normal (37mm). There is borderline dilatation of the aortic root, measuring 38 mm.  Pulmonary Artery: The pulmonary artery is dilated.  Venous: The inferior vena cava is dilated in size with less than 50% respiratory variability, suggesting right atrial pressure of 15 mmHg.  EKG: Rhythm strip during  this exam demonstrates atrial fibrillation.  LEFT VENTRICLE PLAX 2D LVIDd:         7.00 cm LVIDs:         6.40 cm LV PW:         1.30 cm LV IVS:        1.10 cm LVOT diam:     2.00 cm LVOT Area:     3.14 cm   IVC IVC diam: 2.80 cm  LEFT ATRIUM           Index        RIGHT ATRIUM           Index LA Vol (A2C): 62.9 ml 27.23 ml/m  RA Area:     34.40 cm LA Vol (A4C): 71.0 ml 30.73 ml/m  RA Volume:   140.00 ml 60.60 ml/m  AORTA Ao Root diam: 3.80 cm Ao Asc diam:  3.70 cm  MR Peak grad:    47.3 mmHg    TRICUSPID VALVE MR Mean grad:    27.0 mmHg    TR Peak grad:   25.0 mmHg MR Vmax:         344.00 cm/s  TR Vmax:        250.00 cm/s MR Vmean:        249.0 cm/s MR PISA:         6.28 cm     SHUNTS MR PISA Eff ROA: 109 mm      Systemic Diam: 2.00 cm MR PISA Radius:  1.00 cm  Sunit Tolia DO Electronically signed by Tessa Lerner DO Signature Date/Time: 09/20/2022/9:08:13 PM    Final   TEE  ECHO TEE 09/22/2022  Narrative TRANSESOPHOGEAL ECHO REPORT    Patient Name:   JEEVAN KALLA Date of Exam: 09/22/2022 Medical Rec #:  [id number]      Height:       81.0 in Accession #:    [id number]     Weight:       176.3 lb Date of Birth:  [birth year]      BSA:          2.199 m Patient Age:    66 years       BP:           94/77 mmHg Patient Gender: M              HR:           130 bpm. Exam Location:  Inpatient  Procedure: Transesophageal Echo, Color Doppler and Cardiac Doppler  Indications:     atrial fibrillation  History:         Patient has prior history of Echocardiogram examinations, most recent 09/20/2022. Cardiomyopathy; Signs/Symptoms:Shortness of Breath.  Sonographer:     Delcie Roch RDCS Referring Phys:  [id number] Tessa Lerner Diagnosing Phys: Yates Decamp MD  PROCEDURE: After discussion of the risks and benefits of a TEE, an informed consent was obtained from the patient. The transesophogeal probe  was passed without difficulty through the esophogus of the patient.  Imaged were obtained with the patient in a left lateral decubitus position. Local oropharyngeal anesthetic was provided with Cetacaine. Sedation performed by different physician. The patient was monitored while under deep sedation. Anesthestetic sedation was provided intravenously by Anesthesiology: 40mg  of Propofol, 50mg  of Lidocaine. The patient developed no complications during the procedure. A direct current cardioversion was performed.  IMPRESSIONS   1. Left ventricular ejection fraction, by estimation, is <20%. The left ventricle has severely decreased function. The left ventricle demonstrates global hypokinesis. The left ventricular internal cavity size was mildly dilated. There is mild left ventricular hypertrophy. Left ventricular diastolic function could not be evaluated. 2. Right ventricular systolic function is moderately reduced. The right ventricular size is mildly enlarged. There is mildly elevated pulmonary artery systolic pressure. The estimated right ventricular systolic pressure is 46.6 mmHg. 3. Left atrial size was severely dilated. No left atrial/left atrial appendage thrombus was detected. The LAA emptying velocity was 15 cm/s. 4. Right atrial size was moderately dilated. 5. The mitral valve is normal in structure. Mild mitral valve regurgitation. No evidence of mitral stenosis. 6. Mild annular dilatation. Tricuspid valve regurgitation is moderate. 7. The aortic valve is normal in structure. Aortic valve regurgitation is not visualized. No aortic stenosis is present. 8. There is mild (Grade II) plaque involving the ascending aorta and descending aorta.  Conclusion(s)/Recommendation(s): No LAA thrombus, proceed with cardioversion.  FINDINGS Left Ventricle: Left ventricular ejection fraction, by estimation, is <20%. The left ventricle has severely decreased function. The left ventricle demonstrates global hypokinesis. The left ventricular internal cavity size was mildly  dilated. There is mild left ventricular hypertrophy. Left ventricular diastolic function could not be evaluated.  Right Ventricle: The right ventricular size is mildly enlarged. No increase in right ventricular wall thickness. Right ventricular systolic function is moderately reduced. There is mildly elevated pulmonary artery systolic pressure. The tricuspid regurgitant velocity is 2.81 m/s, and with an assumed right atrial pressure of 15 mmHg, the estimated right ventricular systolic pressure is 46.6 mmHg.  Left Atrium: Mild smoke. LAA well visualized with no thrombus. Left atrial size was severely dilated. Spontaneous echo contrast was present in the left atrium and left atrial appendage. No left atrial/left atrial appendage thrombus was detected. The LAA emptying velocity was 15 cm/s.  Right Atrium: Right atrial size was moderately dilated.  Pericardium: There is no evidence of pericardial effusion.  Mitral Valve: The mitral valve is normal in structure. Mild mitral valve regurgitation. No evidence of mitral valve stenosis.  Tricuspid Valve: Mild annular dilatation. The tricuspid valve is normal in structure. Tricuspid valve regurgitation is moderate . No evidence of tricuspid stenosis.  Aortic Valve: The aortic valve is normal in structure. Aortic valve regurgitation is not visualized. No aortic stenosis is present.  Pulmonic Valve: The pulmonic valve was normal in structure. Pulmonic valve regurgitation is trivial. No evidence of pulmonic stenosis.  Aorta: The aortic root, ascending aorta and aortic arch are all structurally normal, with no evidence of dilitation or obstruction. There is mild (Grade II) plaque involving the ascending aorta and descending aorta.  Venous: Assuming dilated IVC with blunted respiratory response. The inferior vena cava was not well visualized.  IAS/Shunts: No atrial level shunt detected by color flow Doppler.   TRICUSPID VALVE TR Peak grad:   31.6  mmHg TR Vmax:        281.00 cm/s  Yates Decamp MD Electronically signed by Yates Decamp MD Signature Date/Time: 09/22/2022/6:14:45  PM    Final            Risk Assessment/Calculations:    CHA2DS2-VASc Score = 3   This indicates a 3.2% annual risk of stroke. The patient's score is based upon: CHF History: 1 HTN History: 0 Diabetes History: 0 Stroke History: 0 Vascular Disease History: 1 Age Score: 1 Gender Score: 0            Physical Exam:   VS:  BP 112/80 (BP Location: Left Arm, Patient Position: Sitting, Cuff Size: Normal)   Pulse 84   Ht 6\' 9"  (2.057 m)   Wt 180 lb 14.4 oz (82.1 kg)   SpO2 97%   BMI 19.39 kg/m    Wt Readings from Last 3 Encounters:  01/12/23 180 lb 14.4 oz (82.1 kg)  11/10/22 181 lb 3.2 oz (82.2 kg)  09/23/22 179 lb 14.4 oz (81.6 kg)    GEN: Well nourished, well developed in no acute distress NECK: No JVD; No carotid bruits CARDIAC: IRIR, tachycardic, no murmurs, rubs, gallops RESPIRATORY:  Clear to auscultation without rales, wheezing or rhonchi  ABDOMEN: Soft, non-tender, non-distended EXTREMITIES:  No edema; No deformity   ASSESSMENT AND PLAN: .    PAF / Hypercoagulable state - s/p TEE/DCCV 09/21/22. At last visit with Dr. Odis Hollingshead, Amiodarone changed to Multaq and Toprol reduced. EKG today atrial flutter 127 bpm. Increase Toprol from 25mg  to 50mg  daily. Stop Entresto to prevent hypotension. Set up for cardioversion 01/23/23 with Dr. Odis Hollingshead.  Could consider Digoxin in future for dual AF and heart failure benefit.   Chronic HFrEF / NICM / severe MR - TEE 09/22/22 LVEF <20%. GDMT includes Farxiga 10mg  daily, Lasix 20mg  PRN (has not needed), Toprol (increase to 50mg  for recurrent atrial flutter). Stop Entresto to prevent hypotension, as above.  Low sodium diet, fluid restriction <2L, and daily weights encouraged. Educated to contact our office for weight gain of 2 lbs overnight or 5 lbs in one week. Handouts on heart healthy diet provided. Could consider  trial of low dose ARB and/or Spironolactone at follow up based on Dr. Emelda Brothers prior notes. Defer today due to need to address atrial flutter.   Hypotension - Relatively hypotensive today. As we have increased Toprol, stop Entresto. Continue Midodrine 5mg  BID. Ideally in future would stabilize BP without need for Midodrine.   Ascending aortic aneurysm - CT 09/19/22 ascending thoracic aorta 4.1 cm. Continue Metoprolol Succinate. Recommend CT aorta 09/2023 for monitoring ordered today.   Hyperlipidemia / Elevated Lp(a) - 09/2022 Lp(a) 77.9. LHC 09/2022 with no coronary artery disease. Interestingly, 06/12/22 LDL 61 on no lipid lowering agents. Not addressed at this clinic visit     Informed Consent   Shared Decision Making/Informed Consent The risks (stroke, cardiac arrhythmias rarely resulting in the need for a temporary or permanent pacemaker, skin irritation or burns and complications associated with conscious sedation including aspiration, arrhythmia, respiratory failure and death), benefits (restoration of normal sinus rhythm) and alternatives of a direct current cardioversion were explained in detail to Mr. Chien and he agrees to proceed.       Dispo: follow up 2-3 weeks after cardioversion  Signed, Alver Sorrow, NP

## 2023-01-11 NOTE — Telephone Encounter (Signed)
Spoke with patient he wants a referral sent over for wax build up and hssing and rining of the ears. He said his pcp send over a referral. There is no referral so I called PCP to see if we can get one sent over. I left voicemail of refferals to call me back so we can get him scheduled

## 2023-01-11 NOTE — Progress Notes (Signed)
Cardiology Office Note:  .   Date:  01/12/2023  ID:  Leanord Hawking, DOB 20-Aug-1955, MRN [id number] PCP: Mirna Mires, MD  [facility] Providers Cardiologist:  Tessa Lerner, DO    History of Present Illness: .   [name] is a 67 y.o. male with a history of HFrEF/NICM, PAF s/p DCCV 09/2022, ascending aortic aneurysm 41mm, hypothyroidism, thrombocytopenia.   Admitted 06/2022 with atrial fibrillation with RVR, declined cardioversion and LHC at that time. Echo 06/2022 LVEF 20-25%, RVSF severely reduced, severe MR.   Seen by Dr. Odis Hollingshead 09/2022 with acute decompensated heart failure and atrial fibrillation. Subsequently admitted 6/18-6/22/24 with workup including: Echo LVE <20%, LV global hypokinesis, RVSF severely reduced, mildly elevated PASP, severe MR, ascending aorta 37mm and aortic root 38mm.  LHC no CAD, started on IV amiodarone, 09/21/22 TEE/DCCV. He was diuresed 5.7 L and was recommended for palliative care which he declined.   Last seen by Dr. Odis Hollingshead 11/10/22 doing well from a cardiac standpoint NYHA II symptoms and weight gain of 3 lbs from discharge. Toprol reduced to 25mg  due to bradycardia. Amiodarone stopped and Multaq 400mg  BID initiated. Patient assistance paperwork provided.   He presents today for follow up.  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 Over the last 3 days recurrent heart racing, lightheadedness. No near syncope, syncope, chest pain, edema, orthopnea. He does note he missed a few days of Farxiga as picked up refill late and symptoms started after resuming. Feels similar to when he was admitted 06/2022 and 09/2022.  Has had issues with sinuses. Feels congested in his head, hearing ringing sound. Notes stopped up nose and difficulty breathing through his nose. The ringing in the ears is new and occurred after his PCP used a metal instrument to get wax out of his ears. The sinus congestion has been ongoing  for some time - awaiting appointment with ENT.  ROS: Please see the history of present illness.    All other systems reviewed and are negative.   Studies Reviewed: Marland Kitchen   EKG Interpretation Date/Time:  Friday January 12 2023 11:27:50 EDT Ventricular Rate:  127 PR Interval:    QRS Duration:  84 QT Interval:  284 QTC Calculation: 412 R Axis:   4  Text Interpretation: Atrial flutter with variable A-V block Minimal voltage criteria for LVH, may be normal variant ( Sokolow-Lyon ) Confirmed by Gillian Shields ([id number]) on 01/12/2023 12:11:10 PM    Cardiac Studies & Procedures   CARDIAC CATHETERIZATION  CARDIAC CATHETERIZATION 09/21/2022  Narrative LM: Normal LAD: Normal RI: Normal Lcx: Normal RCA: Normal  RA: 5 mmHg RV: 32/5 mmHg PA: 27/4 mmHg, mPAP 22 mmHg PCW: Could not be obtained as catheter could not reach the wedge position (Patient is 6 ft 9 in).  LV 82/4 mmHg LVEDP 8 mmHg (Patient was given 500 cc saline bolus)  No coronary artery disease Compensated nonischemic cardiomyopathy Borderline pulmonary hypertension, WHO grp II Continue GDMT for heart failure and sinus rhythm restoration Okay to resume OAC this afternoon  Elder Negus, MD Pager: [phone] Office: [phone]  Findings Coronary Findings Diagnostic  Dominance: Right  No diagnostic findings have been documented. Intervention  No interventions have been documented.     ECHOCARDIOGRAM  ECHOCARDIOGRAM LIMITED 09/20/2022  Narrative ECHOCARDIOGRAM LIMITED REPORT    Patient Name:   [name] Date of Exam: 09/20/2022 Medical Rec #:  [id number]      Height:  81.0 in Accession #:    [id number]     Weight:       198.0 lb Date of Birth:  1956-02-14      BSA:          2.310 m Patient Age:    66 years       BP:           89/69 mmHg Patient Gender: M              HR:           102 bpm. Exam Location:  Inpatient  Procedure: Limited Echo, Cardiac Doppler and Color  Doppler  Indications:     CHF  History:         Patient has prior history of Echocardiogram examinations, most recent 06/12/2022. CHF and Cardiomyopathy, Mitral Valve Disease; Arrythmias:Atrial Fibrillation.  Sonographer:     Darlys Gales Referring Phys:  [id number] SUNIT TOLIA Diagnosing Phys: Tessa Lerner DO  IMPRESSIONS   1. LIMITED ECHO - for more details refer to full echo from June 12, 2022. 2. Left ventricular ejection fraction, by estimation, is <20%. The left ventricle has severely decreased function. The left ventricle demonstrates global hypokinesis. The left ventricular internal cavity size was dilated. There is mild left ventricular hypertrophy. Left ventricular diastolic function could not be evaluated. 3. RVSP is underestimated due to dilated RV and RA chambers. . Right ventricular systolic function is severely reduced. The right ventricular size is severely enlarged. There is mildly elevated pulmonary artery systolic pressure. The estimated right ventricular systolic pressure is 40.0 mmHg. 4. Left atrial size was mildly dilated. 5. Right atrial size was severely dilated. 6. The mitral valve is degenerative. Severe mitral valve regurgitation. No evidence of mitral stenosis. 7. Tricuspid valve regurgitation is severe. 8. The aortic valve is grossly normal. Aortic valve regurgitation is not visualized. No aortic stenosis is present. 9. Aortic proximal ascending aorta at upper limit of normal (37mm). There is borderline dilatation of the aortic root, measuring 38 mm. 10. Dilated pulmonary artery. 11. The inferior vena cava is dilated in size with <50% respiratory variability, suggesting right atrial pressure of 15 mmHg. 12. Rhythm strip during this exam demonstrates atrial fibrillation.  Comparison(s): A prior study was performed on 06/12/2022. No significant change.  Conclusion(s)/Recommendation(s): No left ventricular mural or apical thrombus/thrombi.  FINDINGS Left  Ventricle: Left ventricular ejection fraction, by estimation, is <20%. The left ventricle has severely decreased function. The left ventricle demonstrates global hypokinesis. Definity contrast agent was given IV to delineate the left ventricular endocardial borders. The left ventricular internal cavity size was dilated. There is mild left ventricular hypertrophy. Left ventricular diastolic function could not be evaluated. Left ventricular diastolic function could not be evaluated due to atrial fibrillation.  Right Ventricle: RVSP is underestimated due to dilated RV and RA chambers. The right ventricular size is severely enlarged. Right ventricular systolic function is severely reduced. There is mildly elevated pulmonary artery systolic pressure. The tricuspid regurgitant velocity is 2.50 m/s, and with an assumed right atrial pressure of 15 mmHg, the estimated right ventricular systolic pressure is 40.0 mmHg.  Left Atrium: Left atrial size was mildly dilated.  Right Atrium: Right atrial size was severely dilated.  Pericardium: There is no evidence of pericardial effusion.  Mitral Valve: The mitral valve is degenerative in appearance. Severe mitral valve regurgitation, with centrally-directed jet. No evidence of mitral valve stenosis.  Tricuspid Valve: The tricuspid valve is grossly normal. Tricuspid valve regurgitation is severe. No  evidence of tricuspid stenosis.  Aortic Valve: The aortic valve is grossly normal. Aortic valve regurgitation is not visualized. No aortic stenosis is present.  Pulmonic Valve: The pulmonic valve was grossly normal.  Aorta: Proximal ascending aorta at upper limit of normal (37mm). There is borderline dilatation of the aortic root, measuring 38 mm.  Pulmonary Artery: The pulmonary artery is dilated.  Venous: The inferior vena cava is dilated in size with less than 50% respiratory variability, suggesting right atrial pressure of 15 mmHg.  EKG: Rhythm strip during  this exam demonstrates atrial fibrillation.  LEFT VENTRICLE PLAX 2D LVIDd:         7.00 cm LVIDs:         6.40 cm LV PW:         1.30 cm LV IVS:        1.10 cm LVOT diam:     2.00 cm LVOT Area:     3.14 cm   IVC IVC diam: 2.80 cm  LEFT ATRIUM           Index        RIGHT ATRIUM           Index LA Vol (A2C): 62.9 ml 27.23 ml/m  RA Area:     34.40 cm LA Vol (A4C): 71.0 ml 30.73 ml/m  RA Volume:   140.00 ml 60.60 ml/m  AORTA Ao Root diam: 3.80 cm Ao Asc diam:  3.70 cm  MR Peak grad:    47.3 mmHg    TRICUSPID VALVE MR Mean grad:    27.0 mmHg    TR Peak grad:   25.0 mmHg MR Vmax:         344.00 cm/s  TR Vmax:        250.00 cm/s MR Vmean:        249.0 cm/s MR PISA:         6.28 cm     SHUNTS MR PISA Eff ROA: 109 mm      Systemic Diam: 2.00 cm MR PISA Radius:  1.00 cm  Sunit Tolia DO Electronically signed by Tessa Lerner DO Signature Date/Time: 09/20/2022/9:08:13 PM    Final   TEE  ECHO TEE 09/22/2022  Narrative TRANSESOPHOGEAL ECHO REPORT    Patient Name:   JEEVAN KALLA Date of Exam: 09/22/2022 Medical Rec #:  [id number]      Height:       81.0 in Accession #:    [id number]     Weight:       176.3 lb Date of Birth:  [birth year]      BSA:          2.199 m Patient Age:    66 years       BP:           94/77 mmHg Patient Gender: M              HR:           130 bpm. Exam Location:  Inpatient  Procedure: Transesophageal Echo, Color Doppler and Cardiac Doppler  Indications:     atrial fibrillation  History:         Patient has prior history of Echocardiogram examinations, most recent 09/20/2022. Cardiomyopathy; Signs/Symptoms:Shortness of Breath.  Sonographer:     Delcie Roch RDCS Referring Phys:  [id number] Tessa Lerner Diagnosing Phys: Yates Decamp MD  PROCEDURE: After discussion of the risks and benefits of a TEE, an informed consent was obtained from the patient. The transesophogeal probe  was passed without difficulty through the esophogus of the patient.  Imaged were obtained with the patient in a left lateral decubitus position. Local oropharyngeal anesthetic was provided with Cetacaine. Sedation performed by different physician. The patient was monitored while under deep sedation. Anesthestetic sedation was provided intravenously by Anesthesiology: 40mg  of Propofol, 50mg  of Lidocaine. The patient developed no complications during the procedure. A direct current cardioversion was performed.  IMPRESSIONS   1. Left ventricular ejection fraction, by estimation, is <20%. The left ventricle has severely decreased function. The left ventricle demonstrates global hypokinesis. The left ventricular internal cavity size was mildly dilated. There is mild left ventricular hypertrophy. Left ventricular diastolic function could not be evaluated. 2. Right ventricular systolic function is moderately reduced. The right ventricular size is mildly enlarged. There is mildly elevated pulmonary artery systolic pressure. The estimated right ventricular systolic pressure is 46.6 mmHg. 3. Left atrial size was severely dilated. No left atrial/left atrial appendage thrombus was detected. The LAA emptying velocity was 15 cm/s. 4. Right atrial size was moderately dilated. 5. The mitral valve is normal in structure. Mild mitral valve regurgitation. No evidence of mitral stenosis. 6. Mild annular dilatation. Tricuspid valve regurgitation is moderate. 7. The aortic valve is normal in structure. Aortic valve regurgitation is not visualized. No aortic stenosis is present. 8. There is mild (Grade II) plaque involving the ascending aorta and descending aorta.  Conclusion(s)/Recommendation(s): No LAA thrombus, proceed with cardioversion.  FINDINGS Left Ventricle: Left ventricular ejection fraction, by estimation, is <20%. The left ventricle has severely decreased function. The left ventricle demonstrates global hypokinesis. The left ventricular internal cavity size was mildly  dilated. There is mild left ventricular hypertrophy. Left ventricular diastolic function could not be evaluated.  Right Ventricle: The right ventricular size is mildly enlarged. No increase in right ventricular wall thickness. Right ventricular systolic function is moderately reduced. There is mildly elevated pulmonary artery systolic pressure. The tricuspid regurgitant velocity is 2.81 m/s, and with an assumed right atrial pressure of 15 mmHg, the estimated right ventricular systolic pressure is 46.6 mmHg.  Left Atrium: Mild smoke. LAA well visualized with no thrombus. Left atrial size was severely dilated. Spontaneous echo contrast was present in the left atrium and left atrial appendage. No left atrial/left atrial appendage thrombus was detected. The LAA emptying velocity was 15 cm/s.  Right Atrium: Right atrial size was moderately dilated.  Pericardium: There is no evidence of pericardial effusion.  Mitral Valve: The mitral valve is normal in structure. Mild mitral valve regurgitation. No evidence of mitral valve stenosis.  Tricuspid Valve: Mild annular dilatation. The tricuspid valve is normal in structure. Tricuspid valve regurgitation is moderate . No evidence of tricuspid stenosis.  Aortic Valve: The aortic valve is normal in structure. Aortic valve regurgitation is not visualized. No aortic stenosis is present.  Pulmonic Valve: The pulmonic valve was normal in structure. Pulmonic valve regurgitation is trivial. No evidence of pulmonic stenosis.  Aorta: The aortic root, ascending aorta and aortic arch are all structurally normal, with no evidence of dilitation or obstruction. There is mild (Grade II) plaque involving the ascending aorta and descending aorta.  Venous: Assuming dilated IVC with blunted respiratory response. The inferior vena cava was not well visualized.  IAS/Shunts: No atrial level shunt detected by color flow Doppler.   TRICUSPID VALVE TR Peak grad:   31.6  mmHg TR Vmax:        281.00 cm/s  Yates Decamp MD Electronically signed by Yates Decamp MD Signature Date/Time: 09/22/2022/6:14:45  PM    Final            Risk Assessment/Calculations:    CHA2DS2-VASc Score = 3   This indicates a 3.2% annual risk of stroke. The patient's score is based upon: CHF History: 1 HTN History: 0 Diabetes History: 0 Stroke History: 0 Vascular Disease History: 1 Age Score: 1 Gender Score: 0            Physical Exam:   VS:  BP 112/80 (BP Location: Left Arm, Patient Position: Sitting, Cuff Size: Normal)   Pulse 84   Ht 6\' 9"  (2.057 m)   Wt 180 lb 14.4 oz (82.1 kg)   SpO2 97%   BMI 19.39 kg/m    Wt Readings from Last 3 Encounters:  01/12/23 180 lb 14.4 oz (82.1 kg)  11/10/22 181 lb 3.2 oz (82.2 kg)  09/23/22 179 lb 14.4 oz (81.6 kg)    GEN: Well nourished, well developed in no acute distress NECK: No JVD; No carotid bruits CARDIAC: IRIR, tachycardic, no murmurs, rubs, gallops RESPIRATORY:  Clear to auscultation without rales, wheezing or rhonchi  ABDOMEN: Soft, non-tender, non-distended EXTREMITIES:  No edema; No deformity   ASSESSMENT AND PLAN: .    PAF / Hypercoagulable state - s/p TEE/DCCV 09/21/22. At last visit with Dr. Odis Hollingshead, Amiodarone changed to Multaq and Toprol reduced. EKG today atrial flutter 127 bpm. Increase Toprol from 25mg  to 50mg  daily. Stop Entresto to prevent hypotension. Set up for cardioversion 01/23/23 with Dr. Odis Hollingshead.  Could consider Digoxin in future for dual AF and heart failure benefit.   Chronic HFrEF / NICM / severe MR - TEE 09/22/22 LVEF <20%. GDMT includes Farxiga 10mg  daily, Lasix 20mg  PRN (has not needed), Toprol (increase to 50mg  for recurrent atrial flutter). Stop Entresto to prevent hypotension, as above.  Low sodium diet, fluid restriction <2L, and daily weights encouraged. Educated to contact our office for weight gain of 2 lbs overnight or 5 lbs in one week. Handouts on heart healthy diet provided. Could consider  trial of low dose ARB and/or Spironolactone at follow up based on Dr. Emelda Brothers prior notes. Defer today due to need to address atrial flutter.   Hypotension - Relatively hypotensive today. As we have increased Toprol, stop Entresto. Continue Midodrine 5mg  BID. Ideally in future would stabilize BP without need for Midodrine.   Ascending aortic aneurysm - CT 09/19/22 ascending thoracic aorta 4.1 cm. Continue Metoprolol Succinate. Recommend CT aorta 09/2023 for monitoring ordered today.   Hyperlipidemia / Elevated Lp(a) - 09/2022 Lp(a) 77.9. LHC 09/2022 with no coronary artery disease. Interestingly, 06/12/22 LDL 61 on no lipid lowering agents. Not addressed at this clinic visit     Informed Consent   Shared Decision Making/Informed Consent The risks (stroke, cardiac arrhythmias rarely resulting in the need for a temporary or permanent pacemaker, skin irritation or burns and complications associated with conscious sedation including aspiration, arrhythmia, respiratory failure and death), benefits (restoration of normal sinus rhythm) and alternatives of a direct current cardioversion were explained in detail to Mr. Chien and he agrees to proceed.       Dispo: follow up 2-3 weeks after cardioversion  Signed, Alver Sorrow, NP

## 2023-01-12 ENCOUNTER — Ambulatory Visit (HOSPITAL_BASED_OUTPATIENT_CLINIC_OR_DEPARTMENT_OTHER): Payer: BC Managed Care – PPO | Admitting: Family

## 2023-01-12 ENCOUNTER — Encounter (HOSPITAL_BASED_OUTPATIENT_CLINIC_OR_DEPARTMENT_OTHER): Payer: Self-pay | Admitting: Family

## 2023-01-12 VITALS — BP 112/80 | HR 84 | Ht >= 80 in | Wt 180.9 lb

## 2023-01-12 DIAGNOSIS — I480 Paroxysmal atrial fibrillation: Secondary | ICD-10-CM | POA: Diagnosis not present

## 2023-01-12 DIAGNOSIS — I7121 Aneurysm of the ascending aorta, without rupture: Secondary | ICD-10-CM

## 2023-01-12 DIAGNOSIS — Z79899 Other long term (current) drug therapy: Secondary | ICD-10-CM

## 2023-01-12 DIAGNOSIS — E7841 Elevated Lipoprotein(a): Secondary | ICD-10-CM | POA: Diagnosis not present

## 2023-01-12 DIAGNOSIS — I959 Hypotension, unspecified: Secondary | ICD-10-CM

## 2023-01-12 DIAGNOSIS — I5022 Chronic systolic (congestive) heart failure: Secondary | ICD-10-CM

## 2023-01-12 DIAGNOSIS — D6859 Other primary thrombophilia: Secondary | ICD-10-CM

## 2023-01-12 MED ORDER — METOPROLOL SUCCINATE ER 50 MG PO TB24
50.0000 mg | ORAL_TABLET | Freq: Every morning | ORAL | 3 refills | Status: DC
Start: 2023-01-12 — End: 2024-02-26

## 2023-01-12 NOTE — Patient Instructions (Signed)
Medication Instructions:   STOP Entresto  INCREASE Metoprolol Succinate to 50mg  daily  *If you need a refill on your cardiac medications before your next appointment, please call your pharmacy*   Lab Work: Your physician recommends that you return for lab work today: CMP, CBC, magnesium  If you have labs (blood work) drawn today and your tests are completely normal, you will receive your results only by: MyChart Message (if you have MyChart) OR A paper copy in the mail If you have any lab test that is abnormal or we need to change your treatment, we will call you to review the results.   Testing/Procedures: Your EKG today shows atrial flutter at a rate of 127 bpm.   Your provider recommends a CT of your aorta 09/2023.  Your CT 09/2022 showed mild dilation of your ascending aorta measuring 41mm. We prevent this from worsening by keeping your blood pressure well controlled.   You are scheduled for a Cardioversion on Tuesday, October 22 with Dr. Odis Hollingshead.  Please arrive at the [facility] (Main Entrance A) at [facility]: [address] [place], [place] [address] at 9:30 AM (This time is 1 hour(s) before your procedure to ensure your preparation). Free valet parking service is available. You will check in at ADMITTING. The support person will be asked to wait in the waiting room.  It is OK to have someone drop you off and come back when you are ready to be discharged.      DIET:  Nothing to eat or drink after midnight except a sip of water with medications (see medication instructions below)  MEDICATION INSTRUCTIONS: !!IF ANY NEW MEDICATIONS ARE STARTED AFTER TODAY, PLEASE NOTIFY YOUR PROVIDER AS SOON AS POSSIBLE!!   HOLD:Dapagliflozin (Farxiga) for 3 days prior to the procedure. Last dose on Saturday, October 19.  Continue taking your anticoagulant (blood thinner): Apixaban (Eliquis).  You will need to continue this after your procedure until you are told by your provider that it  is safe to stop.    LABS: done at [facility] 10/11  FYI:  For your safety, and to allow [place] to monitor your vital signs accurately during the surgery/procedure we request: If you have artificial nails, gel coating, SNS etc, please have those removed prior to your surgery/procedure. Not having the nail coverings /polish removed may result in cancellation or delay of your surgery/procedure.  You must have a responsible person to drive you home and stay in the waiting area during your procedure. Failure to do so could result in cancellation.  Bring your insurance cards.  *Special Note: Every effort is made to have your procedure done on time. Occasionally there are emergencies that occur at the hospital that may cause delays. Please be patient if a delay does occur.   Follow-Up: At [facility], you and your health needs are our priority.  As part of our continuing mission to provide you with exceptional heart care, we have created designated Provider Care Teams.  These Care Teams include your primary Cardiologist (physician) and Advanced Practice Providers (APPs -  Physician Assistants and Nurse Practitioners) who all work together to provide you with the care you need, when you need it.  We recommend signing up for the patient portal called "MyChart".  Sign up information is provided on this After Visit Summary.  MyChart is used to connect with patients for Virtual Visits (Telemedicine).  Patients are able to view lab/test results, encounter notes, upcoming appointments, etc.  Non-urgent messages  can be sent to your provider as well.   To learn more about what you can do with MyChart, go to [email or web address].    Your next appointment:   Nurse visit on either 10/18 or 10/21 at 1pm for EKG prior to Cardioversion   &   2-3 weeks post Cardioversion with Dr. Odis Hollingshead or APP   Other Instructions  Your current cardiac medications: Eliquis (Apixaban) helps to prevent a stroke from your atrial  fibrillation Farxiga (Dapagliflozin) acts like a guard to prevent your body from absorbing extra fluid and salt Multaq (Dronedarone) help to keep your heart in a normal rhythm instead of the atrial fibrillation Lasix (Furosemide) is a fluid pill you take AS NEEDED if you gain 2 pounds overnight or 5 pounds in one week Toprol (Metoprolol) helps to relax your heart which helps to keep your heart rate low and in normal sinus rhythm, it also helps to strengthen your heart muscle function Midodrine is used to raise your blood pressure to help prevent lightheadedness. Our goal is to adjust your other medications so that you do not need as much of this medication.  Recommend weighing daily and keeping a log. Please call our office if you have weight gain of 3 pounds overnight or 5 pounds in 1 week. This would be a day to take your Furosemide (Lasix).   Tips and tricks to prevent lightheadedness and dizziness: Make position changes slowly Eat regular meals and stay hydrated Wear knee high compression stockings during the daytime  Heart Healthy Diet Recommendations: A low-salt diet is recommended. Meats should be grilled, baked, or boiled. Avoid fried foods. Focus on lean protein sources like fish or chicken with vegetables and fruits. The American Heart Association is a Chief Technology Officer!  American Heart Association Diet and Lifeystyle Recommendations   Exercise recommendations: The American Heart Association recommends 150 minutes of moderate intensity exercise weekly. Try 30 minutes of moderate intensity exercise 4-5 times per week. This could include walking, jogging, or swimming.

## 2023-01-13 LAB — COMPREHENSIVE METABOLIC PANEL
ALT: 38 [IU]/L (ref 0–44)
AST: 32 [IU]/L (ref 0–40)
Albumin: 4.4 g/dL (ref 3.9–4.9)
Alkaline Phosphatase: 76 [IU]/L (ref 44–121)
BUN/Creatinine Ratio: 14 (ref 10–24)
BUN: 16 mg/dL (ref 8–27)
Bilirubin Total: 1.5 mg/dL — ABNORMAL HIGH (ref 0.0–1.2)
CO2: 25 mmol/L (ref 20–29)
Calcium: 9.4 mg/dL (ref 8.6–10.2)
Chloride: 104 mmol/L (ref 96–106)
Creatinine, Ser: 1.13 mg/dL (ref 0.76–1.27)
Globulin, Total: 2.3 g/dL (ref 1.5–4.5)
Glucose: 77 mg/dL (ref 70–99)
Potassium: 4.7 mmol/L (ref 3.5–5.2)
Sodium: 142 mmol/L (ref 134–144)
Total Protein: 6.7 g/dL (ref 6.0–8.5)
eGFR: 72 mL/min/{1.73_m2} (ref >59)

## 2023-01-13 LAB — CBC
Hematocrit: 42.1 % (ref 37.5–51.0)
Hemoglobin: 13.8 g/dL (ref 13.0–17.7)
MCH: 30.6 pg (ref 26.6–33.0)
MCHC: 32.8 g/dL (ref 31.5–35.7)
MCV: 93 fL (ref 79–97)
Platelets: 203 10*3/uL (ref 150–450)
RBC: 4.51 x10E6/uL (ref 4.14–5.80)
RDW: 13.9 % (ref 11.6–15.4)
WBC: 6.4 10*3/uL (ref 3.4–10.8)

## 2023-01-13 LAB — MAGNESIUM: Magnesium: 2.2 mg/dL (ref 1.6–2.3)

## 2023-01-15 ENCOUNTER — Telehealth (HOSPITAL_BASED_OUTPATIENT_CLINIC_OR_DEPARTMENT_OTHER): Payer: Self-pay

## 2023-01-15 ENCOUNTER — Other Ambulatory Visit (HOSPITAL_BASED_OUTPATIENT_CLINIC_OR_DEPARTMENT_OTHER): Payer: Self-pay

## 2023-01-15 NOTE — Telephone Encounter (Addendum)
Results called to patient who verbalizes understanding!     ----- Message from Alver Sorrow sent at 01/15/2023  7:44 AM EDT ----- Normal kidneys, liver, electrolytes including potassium and magnesium. CBC with no evidence of anemia nor infection.  Good result!

## 2023-01-15 NOTE — Progress Notes (Signed)
Error

## 2023-01-16 ENCOUNTER — Ambulatory Visit: Payer: Self-pay | Admitting: Cardiology

## 2023-01-22 ENCOUNTER — Ambulatory Visit (HOSPITAL_BASED_OUTPATIENT_CLINIC_OR_DEPARTMENT_OTHER): Payer: BC Managed Care – PPO | Admitting: *Deleted

## 2023-01-22 DIAGNOSIS — I480 Paroxysmal atrial fibrillation: Secondary | ICD-10-CM | POA: Diagnosis not present

## 2023-01-22 NOTE — Progress Notes (Signed)
 Spoke to pt and instructed them to come at 0730 and to be NPO after 0000. Confirmed no missed doses of AC and instructed to take in AM with a small sip of water.   Confirmed that pt will have a ride home and someone to stay with them for 24 hours after the procedure.

## 2023-01-22 NOTE — Progress Notes (Signed)
   Nurse Visit   Date of Encounter: 01/22/2023 ID: Dione Donais, DOB 1955/06/08, MRN [id number]  PCP:  Mirna Mires, MD   [facility] Providers Cardiologist:  Tessa Lerner, DO      Visit Details   VS:  There were no vitals taken for this visit. , BMI There is no height or weight on file to calculate BMI.  Wt Readings from Last 3 Encounters:  01/12/23 180 lb 14.4 oz (82.1 kg)  11/10/22 181 lb 3.2 oz (82.2 kg)  09/23/22 179 lb 14.4 oz (81.6 kg)     Reason for visit: EKG  Performed today: EKG and Provider consulted:EKG  reviewed by Ronn Melena NP, continue with cardioversion as planned  Changes (medications, testing, etc.) : continue current medications  Length of Visit: 10 minutes  Medications Adjustments/Labs and Tests Ordered: Orders Placed This Encounter  Procedures   EKG 12-Lead   No orders of the defined types were placed in this encounter.    Candace Gallus, LPN  [id number] 2:28 PM

## 2023-01-23 ENCOUNTER — Other Ambulatory Visit: Payer: Self-pay

## 2023-01-23 ENCOUNTER — Ambulatory Visit (HOSPITAL_COMMUNITY)
Admission: RE | Admit: 2023-01-23 | Discharge: 2023-01-23 | Disposition: A | Discharge status: Home / Self Care | Payer: BC Managed Care – PPO | Attending: Cardiology | Admitting: Cardiology

## 2023-01-23 ENCOUNTER — Encounter (HOSPITAL_COMMUNITY)
Admission: RE | Disposition: A | Discharge status: Home / Self Care | Payer: Self-pay | Source: Home / Self Care | Attending: Cardiology

## 2023-01-23 ENCOUNTER — Ambulatory Visit (HOSPITAL_COMMUNITY): Payer: Self-pay | Admitting: Certified Registered"

## 2023-01-23 DIAGNOSIS — I483 Typical atrial flutter: Secondary | ICD-10-CM | POA: Diagnosis not present

## 2023-01-23 DIAGNOSIS — Z79899 Other long term (current) drug therapy: Secondary | ICD-10-CM | POA: Diagnosis not present

## 2023-01-23 DIAGNOSIS — I7121 Aneurysm of the ascending aorta, without rupture: Secondary | ICD-10-CM | POA: Insufficient documentation

## 2023-01-23 DIAGNOSIS — E785 Hyperlipidemia, unspecified: Secondary | ICD-10-CM | POA: Diagnosis not present

## 2023-01-23 DIAGNOSIS — I5022 Chronic systolic (congestive) heart failure: Secondary | ICD-10-CM | POA: Diagnosis not present

## 2023-01-23 DIAGNOSIS — I4892 Unspecified atrial flutter: Secondary | ICD-10-CM | POA: Insufficient documentation

## 2023-01-23 DIAGNOSIS — I428 Other cardiomyopathies: Secondary | ICD-10-CM | POA: Diagnosis not present

## 2023-01-23 DIAGNOSIS — D6859 Other primary thrombophilia: Secondary | ICD-10-CM | POA: Diagnosis not present

## 2023-01-23 DIAGNOSIS — I959 Hypotension, unspecified: Secondary | ICD-10-CM | POA: Diagnosis not present

## 2023-01-23 DIAGNOSIS — I480 Paroxysmal atrial fibrillation: Secondary | ICD-10-CM | POA: Insufficient documentation

## 2023-01-23 HISTORY — PX: CARDIOVERSION: SHX1299

## 2023-01-23 SURGERY — CARDIOVERSION
Anesthesia: General

## 2023-01-23 MED ORDER — SODIUM CHLORIDE 0.9 % IV SOLN
INTRAVENOUS | Status: DC
  Administered 2023-01-23: 20 mL/h via INTRAVENOUS

## 2023-01-23 MED ORDER — LIDOCAINE 2% (20 MG/ML) 5 ML SYRINGE
INTRAMUSCULAR | Status: DC | PRN
  Administered 2023-01-23: 60 mg via INTRAVENOUS

## 2023-01-23 MED ORDER — ETOMIDATE 2 MG/ML IV SOLN
INTRAVENOUS | Status: DC | PRN
  Administered 2023-01-23: 8 mg via INTRAVENOUS

## 2023-01-23 SURGICAL SUPPLY — 1 items: PAD DEFIB RADIO PHYSIO CONN (PAD) ×1 IMPLANT

## 2023-01-23 NOTE — CV Procedure (Signed)
   DIRECT CURRENT CARDIOVERSION  NAME:  Garrett Carroll    MRN: [id number] DOB:  March 26, 1956    ADMIT DATE: 01/23/2023  Indication:  Symptomatic atrial flutter  Procedure Note:  The patient signed informed consent.  They have had had therapeutic anticoagulation with Eliquis greater than 3 weeks.  Anesthesia was administered by Dr. Salvadore Farber.  Adequate airway was maintained throughout and vital followed per protocol.  They were cardioverted x 1 with 150J of biphasic synchronized energy.  They converted to NSR.  There were no apparent complications.  The patient had normal neuro status and respiratory status post procedure with vitals stable as recorded elsewhere.    Follow up:  They will continue on current medical therapy and follow up with cardiology as scheduled.  His wife Tyler Aas was updated with the plan of care and to make sure he takes his medications regularly (especially anticoagulation given the recent cardioversion).   Tessa Lerner, DO, [facility] [place]  [facility]  [address] [place], [place] [address] [phone] 9:22 AM

## 2023-01-23 NOTE — Anesthesia Postprocedure Evaluation (Signed)
Anesthesia Post Note  Patient: Branddon Halliwell  Procedure(s) Performed: CARDIOVERSION     Patient location during evaluation: PACU Anesthesia Type: General Level of consciousness: awake and alert, oriented and patient cooperative Pain management: pain level controlled Vital Signs Assessment: post-procedure vital signs reviewed and stable Respiratory status: spontaneous breathing, nonlabored ventilation and respiratory function stable Cardiovascular status: blood pressure returned to baseline and stable Postop Assessment: no apparent nausea or vomiting Anesthetic complications: no   No notable events documented.  Last Vitals:  Vitals:   01/23/23 0744  BP: 109/87  Pulse: 93  Resp: 13  Temp: 36.8 C  SpO2: 99%    Last Pain: There were no vitals filed for this visit.               Lannie Fields

## 2023-01-23 NOTE — Anesthesia Preprocedure Evaluation (Addendum)
Anesthesia Evaluation  Patient identified by MRN, date of birth, ID band Patient awake    Reviewed: Allergy & Precautions, H&P , NPO status , Patient's Chart, lab work & pertinent test results, reviewed documented beta blocker date and time   Airway Mallampati: II  TM Distance: >3 FB Neck ROM: Full    Dental  (+) Poor Dentition, Missing, Loose, Dental Advisory Given,    Pulmonary shortness of breath and with exertion   Pulmonary exam normal breath sounds clear to auscultation       Cardiovascular hypertension (114/96 preop), Pt. on medications and Pt. on home beta blockers pulmonary hypertension (mild pHTN on last TEE)+CHF (LVEF <20% on TEE in June 2024, moderate RV failure)  + dysrhythmias (eliquis) Atrial Fibrillation + Valvular Problems/Murmurs (mild MR) MR  Rhythm:Irregular Rate:Normal  Echo 09/2022  1. Left ventricular ejection fraction, by estimation, is <20%. The left  ventricle has severely decreased function. The left ventricle demonstrates  global hypokinesis. The left ventricular internal cavity size was mildly  dilated. There is mild left  ventricular hypertrophy. Left ventricular diastolic function could not be  evaluated.   2. Right ventricular systolic function is moderately reduced. The right  ventricular size is mildly enlarged. There is mildly elevated pulmonary  artery systolic pressure. The estimated right ventricular systolic  pressure is 46.6 mmHg.   3. Left atrial size was severely dilated. No left atrial/left atrial  appendage thrombus was detected. The LAA emptying velocity was 15 cm/s.   4. Right atrial size was moderately dilated.   5. The mitral valve is normal in structure. Mild mitral valve  regurgitation. No evidence of mitral stenosis.   6. Mild annular dilatation. Tricuspid valve regurgitation is moderate.   7. The aortic valve is normal in structure. Aortic valve regurgitation is  not visualized.  No aortic stenosis is present.   8. There is mild (Grade II) plaque involving the ascending aorta and  descending aorta.     Neuro/Psych negative neurological ROS  negative psych ROS   GI/Hepatic negative GI ROS, Neg liver ROS,,,  Endo/Other  negative endocrine ROS    Renal/GU negative Renal ROS  negative genitourinary   Musculoskeletal negative musculoskeletal ROS (+)    Abdominal   Peds negative pediatric ROS (+)  Hematology negative hematology ROS (+)   Anesthesia Other Findings   Reproductive/Obstetrics negative OB ROS                             Anesthesia Physical Anesthesia Plan  ASA: 4  Anesthesia Plan: General   Post-op Pain Management:    Induction: Intravenous  PONV Risk Score and Plan: TIVA and Treatment may vary due to age or medical condition  Airway Management Planned: Natural Airway and Mask  Additional Equipment: None  Intra-op Plan:   Post-operative Plan:   Informed Consent: I have reviewed the patients History and Physical, chart, labs and discussed the procedure including the risks, benefits and alternatives for the proposed anesthesia with the patient or authorized representative who has indicated his/her understanding and acceptance.       Plan Discussed with: CRNA  Anesthesia Plan Comments: (Etomidate for LVEF<20% and baseline BP borderline low)       Anesthesia Quick Evaluation

## 2023-01-23 NOTE — Transfer of Care (Signed)
Immediate Anesthesia Transfer of Care Note  Patient: Garrett Carroll  Procedure(s) Performed: CARDIOVERSION  Patient Location: PACU and Cath Lab  Anesthesia Type:General  Level of Consciousness: awake and sedated  Airway & Oxygen Therapy: Patient connected to nasal cannula oxygen  Post-op Assessment: Report given to RN and Post -op Vital signs reviewed and stable  Post vital signs: Reviewed and stable  Last Vitals:  Vitals Value Taken Time  BP    Temp    Pulse    Resp    SpO2      Last Pain: There were no vitals filed for this visit.       Complications: No notable events documented.

## 2023-01-23 NOTE — Interval H&P Note (Signed)
History and Physical Interval Note:  01/23/2023 8:35 AM  Garrett Carroll  has presented today for surgery, with the diagnosis of AFLUTTER.  The various methods of treatment have been discussed with the patient and family. After consideration of risks, benefits and other options for treatment, the patient has consented to  Procedure(s): CARDIOVERSION (N/A) as a surgical intervention.  The patient's history has been reviewed, patient examined, no change in status, stable for surgery.  I have reviewed the patient's chart and labs.  Questions were answered to the patient's satisfaction.    Informed Consent   Shared Decision Making/Informed Consent The risks (stroke, cardiac arrhythmias rarely resulting in the need for a temporary or permanent pacemaker, skin irritation or burns and complications associated with conscious sedation including aspiration, arrhythmia, respiratory failure and death), benefits (restoration of normal sinus rhythm) and alternatives of a direct current cardioversion were explained in detail to [name] and he agrees to proceed.       Tessa Lerner, DO, [facility] [place]  [facility]  [address] [place], [place] [address]

## 2023-01-24 ENCOUNTER — Encounter (HOSPITAL_COMMUNITY): Payer: Self-pay | Admitting: Cardiology

## 2023-02-06 ENCOUNTER — Other Ambulatory Visit: Payer: Self-pay | Admitting: Cardiology

## 2023-02-06 DIAGNOSIS — Z79899 Other long term (current) drug therapy: Secondary | ICD-10-CM

## 2023-02-06 DIAGNOSIS — I480 Paroxysmal atrial fibrillation: Secondary | ICD-10-CM

## 2023-02-07 ENCOUNTER — Ambulatory Visit: Payer: BC Managed Care – PPO | Admitting: Cardiology

## 2023-02-09 ENCOUNTER — Encounter (INDEPENDENT_AMBULATORY_CARE_PROVIDER_SITE_OTHER): Payer: Self-pay | Admitting: Otolaryngology

## 2023-02-12 ENCOUNTER — Telehealth: Payer: Self-pay | Admitting: Cardiology

## 2023-02-12 DIAGNOSIS — I480 Paroxysmal atrial fibrillation: Secondary | ICD-10-CM

## 2023-02-12 DIAGNOSIS — Z79899 Other long term (current) drug therapy: Secondary | ICD-10-CM

## 2023-02-12 NOTE — Telephone Encounter (Signed)
 *  STAT* If patient is at the pharmacy, call can be transferred to refill team.   1. Which medications need to be refilled? (please list name of each medication and dose if known)   dapagliflozin propanediol (FARXIGA) 10 MG TABS tablet  metoprolol succinate (TOPROL XL) 50 MG 24 hr tablet   2. Would you like to learn more about the convenience, safety, & potential cost savings by using the [facility]?    3. Are you open to using the [facility]    4. Which pharmacy/location (including street and city if local pharmacy) is medication to be sent to?  [id number] - [place], [place] - [address]    5. Do they need a 30 day or 90 day supply?  90 day

## 2023-02-14 ENCOUNTER — Telehealth: Payer: Self-pay | Admitting: Cardiology

## 2023-02-14 DIAGNOSIS — Z79899 Other long term (current) drug therapy: Secondary | ICD-10-CM

## 2023-02-14 DIAGNOSIS — I480 Paroxysmal atrial fibrillation: Secondary | ICD-10-CM

## 2023-02-14 MED ORDER — MULTAQ 400 MG PO TABS
400.0000 mg | ORAL_TABLET | Freq: Two times a day (BID) | ORAL | 3 refills | Status: DC
Start: 2023-02-14 — End: 2023-05-25

## 2023-02-14 NOTE — Telephone Encounter (Signed)
*  STAT* If patient is at the pharmacy, call can be transferred to refill team.   1. Which medications need to be refilled? (please list name of each medication and dose if known) Multaq   2. Would you like to learn more about the convenience, safety, & potential cost savings by using the [facility]?     3. Are you open to using the [facility] (Type [facility].    4. Which pharmacy/location (including street and city if local pharmacy) is medication to be sent to?  [facility] [address], [place]   5. Do they need a 30 day or 90 day supply? 30 days and refills

## 2023-02-14 NOTE — Telephone Encounter (Signed)
Pt's medication was sent to pt's pharmacy as requested. Confirmation received.  °

## 2023-02-24 NOTE — Progress Notes (Deleted)
Cardiology Office Note    Date:  02/24/2023  ID:  Garrett Carroll, DOB [birth year], MRN [id number] PCP:  Mirna Mires, MD  Cardiologist:  Tessa Lerner, DO  Electrophysiologist:  None   Chief Complaint: Follow up s/p cardioversion  History of Present Illness: .    Garrett Carroll is a 67 y.o. male with visit-pertinent history of HFrEF/NICM, PAF s/p DCCV 09/2022 & 01/2023, ascending aortic aneurysm 41 mm, hypothyroidism, thrombocytopenia.   In 06/2022 he was admitted with atrial fibrillation with RVR, declined cardioversion and LHC at that time.  Echo in 06/2022 indicated LVEF of 20 to 25%, RV SF severely reduced, severe MR.  He was seen by Dr. Billy Coast in 09/2022 with acute decompensated heart failure and atrial fibrillation.  Subsequently admitted 6/18 - 09/23/2022, workup included echo which indicated LVEF less than 20%, LV global hypokinesis, RV SF severely reduced, mildly elevated PASP, severe MR, ascending aorta at 37 mm and aortic root 38 mm.  He underwent left heart cath which indicated no CAD, started on IV amiodarone, 09/21/2022 he underwent TEE and DCCV.  He was diuresed 5.7 L and was recommended for palliative care which she declined.  He was last seen by Dr. Billy Coast on 11/10/2022 and was stable from a cardiac standpoint his Toprol was reduced to 25 mg due to bradycardia.  Amiodarone was stopped and he was started on Multaq 400 mg twice daily.  He was seen in clinic on 01/12/2023 by Gillian Shields, NP.  He noted that in the 3 days prior to his visit he had recurrent heart racing, lightheadedness.  Noted that he had missed a few days of his [place].  His EKG indicated that he was in atrial flutter at 127 bpm.  His Toprol was increased from 25 to 50 mg daily and his Sherryll Burger was stopped to prevent hypotension.  On 01/23/2023 he underwent DCCV with Dr. Billy Coast.  He was successfully converted to sinus rhythm with 1 150 J of biphasic synchronized energy.  Today he presents for follow-up.  He reports that  he   PAF/hypercoagulable state: s/p TEE/DCCV on 09/21/22 and DCCV on 01/23/23. EKG today indicates  Continue   Chronic HFrEF/NICM/severe MR: TEE 09/22/2022 indicated LVEF less than 20%. GDMT includes fark CIGA 10 mg daily, Lasix 20 mg as needed, Toprol Entresto previously stopped for prevention of hypotension. As he is still requiring midodrine, will start spironolactone 12.5 mg daily or losartan 25 mg daily pending blood pressure.  Low-sodium diet, fluid restriction less than 2 L and daily weights encouraged.  Educated to contact our office for weight gain of 2 pounds overnight or 5 pounds in 1 week.  Hypotension: Blood pressure today  On Midodrine 5 mg BID?   Ascending aortic aneurysm: CT 09/19/22 showed ascending thoracic aorta 4.1 cm. Continue Metoprolol succinate. To have CT aorta 09/2023.   Hyperlipidemia/Elevated Lp(a): LHC in 09/2022 with no coronary artery disease.  Lp (a) at that time 77.9.   Labwork independently reviewed: 01/12/2023: Sodium 142, potassium 4.7, creatinine 1.13, AST 32, ALT 38, hemoglobin 13.8, hematocrit 42.1, magnesium 2.2   ROS: .   *** denies chest pain, shortness of breath, lower extremity edema, fatigue, palpitations, melena, hematuria, hemoptysis, diaphoresis, weakness, presyncope, syncope, orthopnea, and PND.  All other systems are reviewed and otherwise negative.  Studies Reviewed: Marland Kitchen    EKG:  EKG is ordered today, personally reviewed, demonstrating ***      CV Studies:  Cardiac Studies & Procedures   CARDIAC CATHETERIZATION  CARDIAC CATHETERIZATION 09/21/2022  Narrative LM: Normal LAD: Normal RI: Normal Lcx: Normal RCA: Normal  RA: 5 mmHg RV: 32/5 mmHg PA: 27/4 mmHg, mPAP 22 mmHg PCW: Could not be obtained as catheter could not reach the wedge position (Patient is 6 ft 9 in).  LV 82/4 mmHg LVEDP 8 mmHg (Patient was given 500 cc saline bolus)  No coronary artery disease Compensated nonischemic cardiomyopathy Borderline pulmonary  hypertension, WHO grp II Continue GDMT for heart failure and sinus rhythm restoration Okay to resume OAC this afternoon  Elder Negus, MD Pager: [phone] Office: [phone]  Findings Coronary Findings Diagnostic  Dominance: Right  No diagnostic findings have been documented. Intervention  No interventions have been documented.     ECHOCARDIOGRAM  ECHOCARDIOGRAM LIMITED 09/20/2022  Narrative ECHOCARDIOGRAM LIMITED REPORT    Patient Name:   Garrett Carroll Date of Exam: 09/20/2022 Medical Rec #:  [id number]      Height:       81.0 in Accession #:    [id number]     Weight:       198.0 lb Date of Birth:  [birth year]      BSA:          2.310 m Patient Age:    66 years       BP:           89/69 mmHg Patient Gender: M              HR:           102 bpm. Exam Location:  Inpatient  Procedure: Limited Echo, Cardiac Doppler and Color Doppler  Indications:     CHF  History:         Patient has prior history of Echocardiogram examinations, most recent 06/12/2022. CHF and Cardiomyopathy, Mitral Valve Disease; Arrythmias:Atrial Fibrillation.  Sonographer:     Darlys Gales Referring Phys:  [id number] SUNIT TOLIA Diagnosing Phys: Tessa Lerner DO  IMPRESSIONS   1. LIMITED ECHO - for more details refer to full echo from June 12, 2022. 2. Left ventricular ejection fraction, by estimation, is <20%. The left ventricle has severely decreased function. The left ventricle demonstrates global hypokinesis. The left ventricular internal cavity size was dilated. There is mild left ventricular hypertrophy. Left ventricular diastolic function could not be evaluated. 3. RVSP is underestimated due to dilated RV and RA chambers. . Right ventricular systolic function is severely reduced. The right ventricular size is severely enlarged. There is mildly elevated pulmonary artery systolic pressure. The estimated right ventricular systolic pressure is 40.0 mmHg. 4. Left atrial size was mildly  dilated. 5. Right atrial size was severely dilated. 6. The mitral valve is degenerative. Severe mitral valve regurgitation. No evidence of mitral stenosis. 7. Tricuspid valve regurgitation is severe. 8. The aortic valve is grossly normal. Aortic valve regurgitation is not visualized. No aortic stenosis is present. 9. Aortic proximal ascending aorta at upper limit of normal (37mm). There is borderline dilatation of the aortic root, measuring 38 mm. 10. Dilated pulmonary artery. 11. The inferior vena cava is dilated in size with <50% respiratory variability, suggesting right atrial pressure of 15 mmHg. 12. Rhythm strip during this exam demonstrates atrial fibrillation.  Comparison(s): A prior study was performed on 06/12/2022. No significant change.  Conclusion(s)/Recommendation(s): No left ventricular mural or apical thrombus/thrombi.  FINDINGS Left Ventricle: Left ventricular ejection fraction, by estimation, is <20%. The left ventricle has severely decreased function. The left ventricle demonstrates global hypokinesis. Definity contrast agent was given IV to delineate the left ventricular endocardial  borders. The left ventricular internal cavity size was dilated. There is mild left ventricular hypertrophy. Left ventricular diastolic function could not be evaluated. Left ventricular diastolic function could not be evaluated due to atrial fibrillation.  Right Ventricle: RVSP is underestimated due to dilated RV and RA chambers. The right ventricular size is severely enlarged. Right ventricular systolic function is severely reduced. There is mildly elevated pulmonary artery systolic pressure. The tricuspid regurgitant velocity is 2.50 m/s, and with an assumed right atrial pressure of 15 mmHg, the estimated right ventricular systolic pressure is 40.0 mmHg.  Left Atrium: Left atrial size was mildly dilated.  Right Atrium: Right atrial size was severely dilated.  Pericardium: There is no evidence  of pericardial effusion.  Mitral Valve: The mitral valve is degenerative in appearance. Severe mitral valve regurgitation, with centrally-directed jet. No evidence of mitral valve stenosis.  Tricuspid Valve: The tricuspid valve is grossly normal. Tricuspid valve regurgitation is severe. No evidence of tricuspid stenosis.  Aortic Valve: The aortic valve is grossly normal. Aortic valve regurgitation is not visualized. No aortic stenosis is present.  Pulmonic Valve: The pulmonic valve was grossly normal.  Aorta: Proximal ascending aorta at upper limit of normal (37mm). There is borderline dilatation of the aortic root, measuring 38 mm.  Pulmonary Artery: The pulmonary artery is dilated.  Venous: The inferior vena cava is dilated in size with less than 50% respiratory variability, suggesting right atrial pressure of 15 mmHg.  EKG: Rhythm strip during this exam demonstrates atrial fibrillation.  LEFT VENTRICLE PLAX 2D LVIDd:         7.00 cm LVIDs:         6.40 cm LV PW:         1.30 cm LV IVS:        1.10 cm LVOT diam:     2.00 cm LVOT Area:     3.14 cm   IVC IVC diam: 2.80 cm  LEFT ATRIUM           Index        RIGHT ATRIUM           Index LA Vol (A2C): 62.9 ml 27.23 ml/m  RA Area:     34.40 cm LA Vol (A4C): 71.0 ml 30.73 ml/m  RA Volume:   140.00 ml 60.60 ml/m  AORTA Ao Root diam: 3.80 cm Ao Asc diam:  3.70 cm  MR Peak grad:    47.3 mmHg    TRICUSPID VALVE MR Mean grad:    27.0 mmHg    TR Peak grad:   25.0 mmHg MR Vmax:         344.00 cm/s  TR Vmax:        250.00 cm/s MR Vmean:        249.0 cm/s MR PISA:         6.28 cm     SHUNTS MR PISA Eff ROA: 109 mm      Systemic Diam: 2.00 cm MR PISA Radius:  1.00 cm  Sunit Tolia DO Electronically signed by Tessa Lerner DO Signature Date/Time: 09/20/2022/9:08:13 PM    Final   TEE  ECHO TEE 09/22/2022  Narrative TRANSESOPHOGEAL ECHO REPORT    Patient Name:   ARAM CADA Date of Exam: 09/22/2022 Medical Rec #:   [id number]      Height:       81.0 in Accession #:    [id number]     Weight:       176.3 lb Date of Birth:  1956-03-02      BSA:          2.199 m Patient Age:    66 years       BP:           94/77 mmHg Patient Gender: M              HR:           130 bpm. Exam Location:  Inpatient  Procedure: Transesophageal Echo, Color Doppler and Cardiac Doppler  Indications:     atrial fibrillation  History:         Patient has prior history of Echocardiogram examinations, most recent 09/20/2022. Cardiomyopathy; Signs/Symptoms:Shortness of Breath.  Sonographer:     Delcie Roch RDCS Referring Phys:  [id number] Tessa Lerner Diagnosing Phys: Yates Decamp MD  PROCEDURE: After discussion of the risks and benefits of a TEE, an informed consent was obtained from the patient. The transesophogeal probe was passed without difficulty through the esophogus of the patient. Imaged were obtained with the patient in a left lateral decubitus position. Local oropharyngeal anesthetic was provided with Cetacaine. Sedation performed by different physician. The patient was monitored while under deep sedation. Anesthestetic sedation was provided intravenously by Anesthesiology: 40mg  of Propofol, 50mg  of Lidocaine. The patient developed no complications during the procedure. A direct current cardioversion was performed.  IMPRESSIONS   1. Left ventricular ejection fraction, by estimation, is <20%. The left ventricle has severely decreased function. The left ventricle demonstrates global hypokinesis. The left ventricular internal cavity size was mildly dilated. There is mild left ventricular hypertrophy. Left ventricular diastolic function could not be evaluated. 2. Right ventricular systolic function is moderately reduced. The right ventricular size is mildly enlarged. There is mildly elevated pulmonary artery systolic pressure. The estimated right ventricular systolic pressure is 46.6 mmHg. 3. Left atrial size was severely  dilated. No left atrial/left atrial appendage thrombus was detected. The LAA emptying velocity was 15 cm/s. 4. Right atrial size was moderately dilated. 5. The mitral valve is normal in structure. Mild mitral valve regurgitation. No evidence of mitral stenosis. 6. Mild annular dilatation. Tricuspid valve regurgitation is moderate. 7. The aortic valve is normal in structure. Aortic valve regurgitation is not visualized. No aortic stenosis is present. 8. There is mild (Grade II) plaque involving the ascending aorta and descending aorta.  Conclusion(s)/Recommendation(s): No LAA thrombus, proceed with cardioversion.  FINDINGS Left Ventricle: Left ventricular ejection fraction, by estimation, is <20%. The left ventricle has severely decreased function. The left ventricle demonstrates global hypokinesis. The left ventricular internal cavity size was mildly dilated. There is mild left ventricular hypertrophy. Left ventricular diastolic function could not be evaluated.  Right Ventricle: The right ventricular size is mildly enlarged. No increase in right ventricular wall thickness. Right ventricular systolic function is moderately reduced. There is mildly elevated pulmonary artery systolic pressure. The tricuspid regurgitant velocity is 2.81 m/s, and with an assumed right atrial pressure of 15 mmHg, the estimated right ventricular systolic pressure is 46.6 mmHg.  Left Atrium: Mild smoke. LAA well visualized with no thrombus. Left atrial size was severely dilated. Spontaneous echo contrast was present in the left atrium and left atrial appendage. No left atrial/left atrial appendage thrombus was detected. The LAA emptying velocity was 15 cm/s.  Right Atrium: Right atrial size was moderately dilated.  Pericardium: There is no evidence of pericardial effusion.  Mitral Valve: The mitral valve is normal in structure. Mild mitral valve regurgitation. No evidence of mitral valve stenosis.  Tricuspid Valve:  Mild annular dilatation. The tricuspid valve is normal in structure. Tricuspid valve regurgitation is moderate . No evidence of tricuspid stenosis.  Aortic Valve: The aortic valve is normal in structure. Aortic valve regurgitation is not visualized. No aortic stenosis is present.  Pulmonic Valve: The pulmonic valve was normal in structure. Pulmonic valve regurgitation is trivial. No evidence of pulmonic stenosis.  Aorta: The aortic root, ascending aorta and aortic arch are all structurally normal, with no evidence of dilitation or obstruction. There is mild (Grade II) plaque involving the ascending aorta and descending aorta.  Venous: Assuming dilated IVC with blunted respiratory response. The inferior vena cava was not well visualized.  IAS/Shunts: No atrial level shunt detected by color flow Doppler.   TRICUSPID VALVE TR Peak grad:   31.6 mmHg TR Vmax:        281.00 cm/s  Yates Decamp MD Electronically signed by Yates Decamp MD Signature Date/Time: 09/22/2022/6:14:45 PM    Final              Current Reported Medications:.    No outpatient medications have been marked as taking for the 02/26/23 encounter (Appointment) with Rip Harbour, NP.    Physical Exam:    VS:  There were no vitals taken for this visit.   Wt Readings from Last 3 Encounters:  01/23/23 181 lb (82.1 kg)  01/12/23 180 lb 14.4 oz (82.1 kg)  11/10/22 181 lb 3.2 oz (82.2 kg)    GEN: Well nourished, well developed in no acute distress NECK: No JVD; No carotid bruits CARDIAC: ***RRR, no murmurs, rubs, gallops RESPIRATORY:  Clear to auscultation without rales, wheezing or rhonchi  ABDOMEN: Soft, non-tender, non-distended EXTREMITIES:  No edema; No acute deformity   Asessement and Plan:.     ***     Disposition: F/u with ***  Signed, Rip Harbour, NP

## 2023-02-26 ENCOUNTER — Ambulatory Visit: Payer: BC Managed Care – PPO | Admitting: Cardiology

## 2023-02-26 DIAGNOSIS — Z79899 Other long term (current) drug therapy: Secondary | ICD-10-CM

## 2023-02-26 DIAGNOSIS — I7121 Aneurysm of the ascending aorta, without rupture: Secondary | ICD-10-CM

## 2023-02-26 DIAGNOSIS — I480 Paroxysmal atrial fibrillation: Secondary | ICD-10-CM

## 2023-02-26 DIAGNOSIS — I5022 Chronic systolic (congestive) heart failure: Secondary | ICD-10-CM

## 2023-02-26 DIAGNOSIS — D6859 Other primary thrombophilia: Secondary | ICD-10-CM

## 2023-03-05 ENCOUNTER — Ambulatory Visit (INDEPENDENT_AMBULATORY_CARE_PROVIDER_SITE_OTHER): Payer: BC Managed Care – PPO | Admitting: Otolaryngology

## 2023-03-05 ENCOUNTER — Ambulatory Visit (INDEPENDENT_AMBULATORY_CARE_PROVIDER_SITE_OTHER): Payer: BC Managed Care – PPO | Admitting: Audiology

## 2023-03-05 ENCOUNTER — Encounter (INDEPENDENT_AMBULATORY_CARE_PROVIDER_SITE_OTHER): Payer: Self-pay

## 2023-03-05 VITALS — Ht >= 80 in | Wt 181.0 lb

## 2023-03-05 DIAGNOSIS — H6123 Impacted cerumen, bilateral: Secondary | ICD-10-CM

## 2023-03-05 DIAGNOSIS — H903 Sensorineural hearing loss, bilateral: Secondary | ICD-10-CM | POA: Diagnosis not present

## 2023-03-05 DIAGNOSIS — H9313 Tinnitus, bilateral: Secondary | ICD-10-CM | POA: Diagnosis not present

## 2023-03-05 NOTE — Progress Notes (Signed)
  [address], [address] [place], [place] [address] [phone]  Audiological Evaluation    Name: Garrett Carroll     DOB:   09/03/1955      MRN:   [id number]                                                                                     Service Date: 03/05/2023     Accompanied by: none   Patient comes today after Garrett Mechanic, PA-C sent a referral for a hearing evaluation due to concerns with hearing loss.   Symptoms Yes Details  Hearing loss  []  Seems improved after he had his ears cleaned.  Tinnitus  [x]  Left hissing  Ear pain/ Ear infections  []    Balance problems  []    Noise exposure  [x]  denied  Previous ear surgeries  []    Family history  []    Amplification  []    Other  []      Otoscopy: Right ear: Clear external ear canals and notable landmarks visualized on the tympanic membrane. Left ear:  Clear external ear canals and notable landmarks visualized on the tympanic membrane.  Tympanometry: Right ear: Type As- Normal external ear canal volume with normal middle ear pressure and low tympanic membrane compliance Left ear: Type A- Normal external ear canal volume with normal middle ear pressure and tympanic membrane compliance  Pure tone Audiometry: Right ear- Normal hearing from [phone] , then a moderate rising to mild hearing loss notch from 3000-6000, then normal hearing at 8000 Hz. Left ear- Normal hearing from [phone] Hz, then mild sloping to moderate sensorineural hearing loss notch from 2000-6000 Hz, then rising to normal at 8000 Hz.  The hearing test results were completed under headphones and re-checked with inserts and results are deemed to be of good reliability. Test technique:  conventional     Speech Audiometry: Right ear- Speech Reception Threshold (SRT) was obtained at 25 dBHL Left ear-Speech Reception Threshold (SRT) was obtained at 25 dBHL   Word Recognition Score Tested using NU-6 (MLV) Right ear: 92% was obtained at a presentation  level of 70 dBHL with contralateral masking which is deemed as  excellent Left ear: 96% was obtained at a presentation level of 70 dBHL with contralateral masking which is deemed as  excellent    Impression: There is not a significant difference in pure-tone thresholds between ears. There is not a significant difference in pure-tone thresholds between ears.   Recommendations: Follow up with ENT as scheduled for today. Return for a hearing evaluation if concerns with hearing changes arise or per MD recommendation. Use hearing protection when exposed to loud/damaging sounds. Consider a communication needs assessment ,pending patient interest.   Garrett Carroll Garrett Carroll, AUD

## 2023-03-08 ENCOUNTER — Other Ambulatory Visit: Payer: Self-pay

## 2023-03-08 ENCOUNTER — Telehealth: Payer: Self-pay | Admitting: Cardiology

## 2023-03-08 MED ORDER — ENTRESTO 24-26 MG PO TABS: 1.0000 | ORAL_TABLET | Freq: Two times a day (BID) | ORAL | 0 refills | Status: DC

## 2023-03-08 NOTE — Telephone Encounter (Signed)
*  STAT* If patient is at the pharmacy, call can be transferred to refill team.   1. Which medications need to be refilled? (please list name of each medication and dose if known)   sacubitril-valsartan (ENTRESTO) 24-26 MG   2. Which pharmacy/location (including street and city if local pharmacy) is medication to be sent to? [id number] [place], [place] - [address] Phone: [phone]  Fax: [phone]     3. Do they need a 30 day or 90 day supply? 90

## 2023-03-14 ENCOUNTER — Telehealth: Payer: Self-pay | Admitting: Cardiology

## 2023-03-14 NOTE — Telephone Encounter (Signed)
He probably is back in either atrial flutter or fibrillation.  If symptomatic he needs to go to ER. If he is reluctant set him up w/ Afib Clinic tomorrow.   Dejion Grillo [place], DO, [facility]

## 2023-03-14 NOTE — Telephone Encounter (Signed)
STAT if HR is under 50 or over 120 (normal HR is 60-100 beats per minute)  What is your heart rate?    Do you have a log of your heart rate readings (document readings)?  12/11: 92/68 139 12/10: 86/72 122  Do you have any other symptoms?  Tiredness, dizziness

## 2023-03-14 NOTE — Telephone Encounter (Signed)
Spoke with wife per DPR and she states patient HR was 92/68 and HR 139. She states his HR has been elevated and BP has been low for a couple of days. No chest pain or SOB. He has felt really tired.  I did advise being that his BP is low he can not take extra half dose of his beta blocker he needs to go to ED.  She then states oh he is not here. He went to work. This was this morning. A couple of hours ago. Did inform her without new set of vitals it will be hard to advise being that he left for work. She states she just spoke with him and he states he is doing fine  I tried to call patient. Mailbox full

## 2023-03-15 NOTE — Telephone Encounter (Signed)
Pt is scheduled with afib clinic for a visit Friday 03/16/23 at 9 am with Lake Bells, PA.

## 2023-03-16 ENCOUNTER — Ambulatory Visit (HOSPITAL_COMMUNITY): Payer: BC Managed Care – PPO | Admitting: Internal Medicine

## 2023-03-17 NOTE — Progress Notes (Signed)
Dear Dr. Loleta Chance, Here is my assessment for our mutual patient, Garrett Carroll. Thank you for allowing me the opportunity to care for your patient. Please do not hesitate to contact me should you have any other questions. Sincerely, Dr. Jovita Kussmaul  Otolaryngology Clinic Note Referring provider: Dr. Loleta Chance HPI:  Garrett Carroll is a 67 y.o. male kindly referred by Dr. Loleta Chance for evaluation of bilateral tinnitus and hearing loss.  Patient reports: that over the past year or so, he has intermittent ringing in both ears (not pulsatile, high pitched), associated with some hearing loss bilaterally and fullness. He thinks it may be wax buildup. He otherwise denies significant ear problems. Comes and goes. Nothing makes it better or worse. Patient denies: ear pain, fullness, vertigo, drainage, tinnitus Patient additionally denies: deep pain in ear canal, eustachian tube symptoms such as popping, crackling, sensitive to pressure changes Patient also denies barotrauma, vestibular suppressant use, ototoxic medication use Prior ear surgery: denies  Personal or FHx of bleeding dz or anesthesia difficulty: no   Tobacco: denies Independent Review of Additional Tests or Records:  03/05/2023 Audiogram was independently reviewed and interpreted by me and it reveals AU: normal downsloping to moderate and then upsloping to mild SNHL (~cookie bite but not exactly); 96% word interpretation AS, 92% AD at 70dB; type A/A tympanogram    SNHL= Sensorineural hearing loss  [facility] 03/2010 independently reviewed to assess for any effusion, ossicular abnormalities or otic capsule pathology: otic capsule unremarkable, mastoids, ME well aerated; no significant paranasal sinus disease, bilateral cerumen impaction   PMH/Meds/All/SocHx/FamHx/ROS:   Past Medical History:  Diagnosis Date   Atrial fibrillation (HCC)    Cardiomyopathy (HCC)    CHF (congestive heart failure) (HCC)      Past Surgical History:  Procedure  Laterality Date   CARDIOVERSION N/A 09/22/2022   Procedure: CARDIOVERSION;  Surgeon: Yates Decamp, MD;  Location: [facility] INVASIVE CV LAB;  Service: Cardiovascular;  Laterality: N/A;   CARDIOVERSION N/A 01/23/2023   Procedure: CARDIOVERSION;  Surgeon: Tessa Lerner, DO;  Location: [facility] INVASIVE CV LAB;  Service: Cardiovascular;  Laterality: N/A;   RIGHT/LEFT HEART CATH AND CORONARY ANGIOGRAPHY N/A 09/21/2022   Procedure: RIGHT/LEFT HEART CATH AND CORONARY ANGIOGRAPHY;  Surgeon: Elder Negus, MD;  Location: [facility] INVASIVE CV LAB;  Service: Cardiovascular;  Laterality: N/A;   TEE WITHOUT CARDIOVERSION N/A 09/22/2022   Procedure: TRANSESOPHAGEAL ECHOCARDIOGRAM;  Surgeon: Yates Decamp, MD;  Location: [facility] INVASIVE CV LAB;  Service: Cardiovascular;  Laterality: N/A;    Family History  Problem Relation Age of Onset   Breast cancer Mother    Stroke Brother      Social Connections: Not on file      Current Outpatient Medications:    dapagliflozin propanediol (FARXIGA) 10 MG TABS tablet, Take 1 tablet (10 mg total) by mouth daily., Disp: 30 tablet, Rfl: 11   dronedarone (MULTAQ) 400 MG tablet, Take 1 tablet (400 mg total) by mouth 2 (two) times daily with a meal., Disp: 180 tablet, Rfl: 3   ELIQUIS 5 MG TABS tablet, TAKE 1 TABLET BY MOUTH TWICE A DAY, Disp: 180 tablet, Rfl: 0   furosemide (LASIX) 20 MG tablet, Take 20 mg by mouth daily as needed for fluid or edema (weight increase by 3 pounds over one day or 5 pounds over week.)., Disp: , Rfl:    metoprolol succinate (TOPROL XL) 50 MG 24 hr tablet, Take 1 tablet (50 mg total) by mouth in the morning. Hold if systolic blood pressure (top number) less  than 100 mmHg or pulse less than 55 bpm., Disp: 90 tablet, Rfl: 3   midodrine (PROAMATINE) 5 MG tablet, TAKE 1 TABLET (5 MG TOTAL) BY MOUTH 2 (TWO) TIMES DAILY WITH A MEAL., Disp: 60 tablet, Rfl: 10   sacubitril-valsartan (ENTRESTO) 24-26 MG, Take 1 tablet by mouth 2 (two) times daily., Disp: 180 tablet, Rfl: 0    Physical Exam:   Ht 6\' 9"  (2.057 m)   Wt 181 lb (82.1 kg)   BMI 19.40 kg/m   Salient findings:  CN II-XII intact  Bilateral EAC with cerumen impaction (see below for clearance). Required clearance for appropriate ear exam, after clerance - TM intact with well pneumatized middle ear spaces Weber 512: midline Rinne 512: AC > BC b/l  Anterior rhinoscopy: Septum relatively midilne; bilateral inferior turbinates with out significant hypertrophy No lesions of oral cavity/oropharynx No obviously palpable neck masses/lymphadenopathy/thyromegaly No respiratory distress or stridor  Seprately Identifiable Procedures:  Procedure: Bilateral ear microscopy and cerumen removal using microscope (CPT 69210) - Mod 50 Pre-procedure diagnosis: Cerumen impaction bilateral external ears Post-procedure diagnosis: same Indication: Bilateral cerumen impaction; given patient's otologic complaints and history as well as for improved and comprehensive examination of external ear and tympanic membrane, bilateral otologic examination using microscope was performed and impacted cerumen removed  Procedure: Patient was placed semi-recumbent. Both ear canals were examined using the microscope with findings above. Cerumen removed on left and on right using suction and currette with improvement in EAC examination and patency. Left: EAC was patent. TM was intact . Middle ear was aerated. Drainage: no Right: EAC was patent. TM was intact . Middle ear was aerated . Drainage: no Patient tolerated the procedure well.   Impression & Plans:  Garrett Carroll is a 67 y.o. male with:  1. Bilateral impacted cerumen   2. Sensorineural hearing loss (SNHL) of both ears   3. Bilateral tinnitus    Symptoms of fullness and ringing and hearing loss are much improved after cerumen disimpaction; Exam otherwise reassuring; we discussed repeat audio but he deferred. Can consider aiding but declined.  - f/u PRN  See below regarding  exact medications prescribed this encounter including dosages and route: No orders of the defined types were placed in this encounter.     Thank you for allowing me the opportunity to care for your patient. Please do not hesitate to contact me should you have any other questions.  Sincerely, Jovita Kussmaul, MD Otolarynoglogist (ENT), [facility] Phone: [phone] Fax: [phone]  03/17/2023, 11:39 AM   MDM:  Level 3: [phone] Complexity/Problems addressed: low - chronic problem Data complexity: mod - independent CT interpret - Morbidity: low  - Prescription Drug prescribed or managed: no

## 2023-03-20 ENCOUNTER — Encounter (HOSPITAL_COMMUNITY): Payer: Self-pay | Admitting: Internal Medicine

## 2023-03-20 ENCOUNTER — Ambulatory Visit (HOSPITAL_COMMUNITY)
Admission: RE | Admit: 2023-03-20 | Discharge: 2023-03-20 | Disposition: A | Discharge status: Home / Self Care | Payer: BC Managed Care – PPO | Source: Ambulatory Visit | Attending: Internal Medicine | Admitting: Internal Medicine

## 2023-03-20 VITALS — BP 90/70 | HR 65 | Ht >= 80 in | Wt 176.6 lb

## 2023-03-20 DIAGNOSIS — Z5181 Encounter for therapeutic drug level monitoring: Secondary | ICD-10-CM | POA: Diagnosis not present

## 2023-03-20 DIAGNOSIS — Z7901 Long term (current) use of anticoagulants: Secondary | ICD-10-CM | POA: Diagnosis not present

## 2023-03-20 DIAGNOSIS — I4819 Other persistent atrial fibrillation: Secondary | ICD-10-CM | POA: Diagnosis not present

## 2023-03-20 DIAGNOSIS — D6869 Other thrombophilia: Secondary | ICD-10-CM | POA: Insufficient documentation

## 2023-03-20 DIAGNOSIS — I4891 Unspecified atrial fibrillation: Secondary | ICD-10-CM

## 2023-03-20 DIAGNOSIS — I483 Typical atrial flutter: Secondary | ICD-10-CM

## 2023-03-20 DIAGNOSIS — Z79899 Other long term (current) drug therapy: Secondary | ICD-10-CM

## 2023-03-20 NOTE — Progress Notes (Signed)
Primary Care Physician: Mirna Mires, MD Primary Cardiologist: Tessa Lerner, DO Electrophysiologist: None     Referring Physician: Dr. Ivor Messier Beltram is a 67 y.o. male with a history of HFrEF, NICM, ascending aortic aneurysm, hypothyroidism, thrombocytopenia, and persistent atrial fibrillation who presents for consultation in the [facility]. Patient transitioned from amiodarone to Multaq on 11/16/22; Toprol reduced to 25 mg daily due to bradycardia. Seen by Cardiology on 10/11 and noted to be in atrial flutter with RVR. He underwent successful DCCV on 01/23/23. Patient called office on 12/11 noting hypotension and HR > 100 for a couple of days. Patient is on Eliquis 5 mg BID for a CHADS2VASC score of 2.  On evaluation today, he is currently in NSR. He is taking Multaq BID and Eliquis 5 mg BID; has not missed any doses. He notes that overall Multaq appears to be helping him but that episode on 12/11 is not the only episode he has had.  Today, he denies symptoms of palpitations, chest pain, shortness of breath, orthopnea, PND, lower extremity edema, dizziness, presyncope, syncope, snoring, daytime somnolence, bleeding, or neurologic sequela. The patient is tolerating medications without difficulties and is otherwise without complaint today.   he has a BMI of Body mass index is 18.92 kg/m.Marland Kitchen Filed Weights   03/20/23 1452  Weight: 80.1 kg    Current Outpatient Medications  Medication Sig Dispense Refill   dapagliflozin propanediol (FARXIGA) 10 MG TABS tablet Take 1 tablet (10 mg total) by mouth daily. 30 tablet 11   dronedarone (MULTAQ) 400 MG tablet Take 1 tablet (400 mg total) by mouth 2 (two) times daily with a meal. 180 tablet 3   ELIQUIS 5 MG TABS tablet TAKE 1 TABLET BY MOUTH TWICE A DAY 180 tablet 0   furosemide (LASIX) 20 MG tablet Take 20 mg by mouth daily as needed for fluid or edema (weight increase by 3 pounds over one day or 5 pounds over  week.).     metoprolol succinate (TOPROL XL) 50 MG 24 hr tablet Take 1 tablet (50 mg total) by mouth in the morning. Hold if systolic blood pressure (top number) less than 100 mmHg or pulse less than 55 bpm. 90 tablet 3   midodrine (PROAMATINE) 5 MG tablet TAKE 1 TABLET (5 MG TOTAL) BY MOUTH 2 (TWO) TIMES DAILY WITH A MEAL. 60 tablet 10   sacubitril-valsartan (ENTRESTO) 24-26 MG Take 1 tablet by mouth 2 (two) times daily. 180 tablet 0   No current facility-administered medications for this encounter.    Atrial Fibrillation Management history:  Previous antiarrhythmic drugs: Multaq Previous cardioversions: 09/22/22, 01/23/23 Previous ablations: none Anticoagulation history: Eliquis   ROS- All systems are reviewed and negative except as per the HPI above.  Physical Exam: BP 90/70   Pulse 65   Ht 6\' 9"  (2.057 m)   Wt 80.1 kg   BMI 18.92 kg/m   GEN: Well nourished, well developed in no acute distress NECK: No JVD; No carotid bruits CARDIAC: Regular rate and rhythm, no murmurs, rubs, gallops RESPIRATORY:  Clear to auscultation without rales, wheezing or rhonchi  ABDOMEN: Soft, non-tender, non-distended EXTREMITIES:  No edema; No deformity   EKG today demonstrates  Vent. rate 65 BPM PR interval 148 ms QRS duration 104 ms QT/QTcB 420/436 ms P-R-T axes 77 81 68 Normal sinus rhythm Possible Left atrial enlargement Left ventricular hypertrophy ( Sokolow-Lyon , Cornell product ) Abnormal ECG When compared with ECG of 23-Jan-2023  09:19, PREVIOUS ECG IS PRESENT  Echo 06/12/22 demonstrated  1. Left ventricular ejection fraction, by estimation, is 20 to 25%. The  left ventricle has severely decreased function. The left ventricle  demonstrates global hypokinesis. The left ventricular internal cavity size  was moderately dilated. Left  ventricular diastolic function could not be evaluated.   2. Right ventricular systolic function is severely reduced. The right  ventricular size is  severely enlarged.   3. Left atrial size was severely dilated.   4. Right atrial size was severely dilated.   5. The mitral valve is normal in structure. Severe mitral valve  regurgitation. No evidence of mitral stenosis.   6. Tricuspid valve regurgitation is severe.   7. The aortic valve is normal in structure. Aortic valve regurgitation is  not visualized. No aortic stenosis is present.   8. Aortic dilatation noted. There is borderline dilatation of the aortic  root, measuring 39 mm. There is borderline dilatation of the ascending  aorta, measuring 38 mm.   9. The inferior vena cava is dilated in size with <50% respiratory  variability, suggesting right atrial pressure of 15 mmHg.   ASSESSMENT & PLAN CHA2DS2-VASc Score = 2  The patient's score is based upon: CHF History: 1 HTN History: 0 Diabetes History: 0 Stroke History: 0 Vascular Disease History: 0 Age Score: 1 Gender Score: 0       ASSESSMENT AND PLAN: Persistent Atrial Fibrillation (ICD10:  I48.19) The patient's CHA2DS2-VASc score is 2, indicating a 2.2% annual risk of stroke.    He is currently in NSR. We discussed options for long term rhythm control which could include Tikosyn or ablation. He could also consider amiodarone again as a bridge to ablation. I did mention to patient that he has severely decreased LV function and it would be reasonable to discuss ablation with EP as well as potential for ICD placement. I did also discuss with patient that due to his reduced EF he would benefit from something other than Multaq.   Intervals are stable. Continue Multaq 400 mg BID for now, anticipate EP will discontinue this with new long term rhythm control plan.    Secondary Hypercoagulable State (ICD10:  D68.69) The patient is at significant risk for stroke/thromboembolism based upon his CHA2DS2-VASc Score of 2.  Continue Apixaban (Eliquis).  No missed doses.   Will refer to EP to discuss ablation and also whether  candidate for ICD.    Lake Bells, PA-C  Afib Clinic [facility] [address] [place], [place] [address] [phone]

## 2023-03-21 NOTE — Addendum Note (Signed)
Encounter addended by: Eustace Pen, PA-C on: 03/21/2023 10:04 AM  Actions taken: Clinical Note Signed

## 2023-03-30 ENCOUNTER — Telehealth: Payer: Self-pay | Admitting: Cardiology

## 2023-03-30 MED ORDER — DAPAGLIFLOZIN PROPANEDIOL 10 MG PO TABS: 10.0000 mg | ORAL_TABLET | Freq: Every day | ORAL | 2 refills | Status: DC

## 2023-03-30 MED ORDER — MIDODRINE HCL 5 MG PO TABS: 5.0000 mg | ORAL_TABLET | Freq: Two times a day (BID) | ORAL | 2 refills | Status: DC

## 2023-03-30 NOTE — Telephone Encounter (Signed)
*  STAT* If patient is at the pharmacy, call can be transferred to refill team.   1. Which medications need to be refilled? (please list name of each medication and dose if known)           dapagliflozin propanediol (FARXIGA) 10 MG TABS tablet     midodrine (PROAMATINE) 5 MG tablet   2. Which pharmacy/location (including street and city if local pharmacy) is medication to be sent to? [id number] [place], [place] - [address]   3. Do they need a 30 day or 90 day supply? 90  Completely out of these medications.

## 2023-04-01 NOTE — Progress Notes (Signed)
 Cardiology Office Note    Date:  04/03/2023  ID:  Ranell Skibinski, DOB January 28, 1956, MRN [id number] PCP:  Leigh Lung, MD  Cardiologist:  Madonna Large, DO  Electrophysiologist:  None   Chief Complaint: Follow up for HFrEF/NICM  History of Present Illness: .    Nicolaos Mitrano is a 67 y.o. male with visit-pertinent history of HFrEF/NICM, PAF s/p DCCV 09/2022 and 01/23/23, ascending aortic aneurysm 41mm, hypothyroidism, thrombocytopenia.  Admitted 06/2022 with atrial fibrillation with RVR, he declined cardioversion and LHC at that time.  Echo on 06/2022 indicated LVEF of 20 to 25%, RV SF severely reduced, severe MR.  He was seen by Dr. Nanda in 09/2022 with acute decompensated heart failure and atrial fibrillation.  Subsequently admitted from 6/18-6/22/24.  Echo at that time indicated LV EF of less than 20%, LV global hypokinesis, RV SF severely reduced, mildly elevated PASP, severe MR, ascending aorta 37 mm and aortic root at 38 mm.  He underwent LHC with no evidence of CAD, started on IV amiodarone , 09/21/2022 he underwent TEE/DCCV.  He was diuresed 5.7 L and was recommended for palliative care which she declined.  He was seen in follow-up by Dr. Nanda on 11/10/2022, he was doing well from a cardiac standpoint, NYHA class II symptoms and weight gain of 3 pounds from discharge.  Toprol  was reduced to 25 mg due to bradycardia.  His amiodarone  was stopped and Multaq  400 mg twice daily initiated.  He was seen in clinic on 01/12/2023 by Reche Finder, NP, he reported recurrent heart racing, lightheadedness.  His EKG indicated atrial flutter at 127 bpm.  On 01/23/2023 he underwent DCCV with Dr. Nanda.  He was successfully converted to sinus rhythm with 150 J of biphasic synchronized energy.  He was seen in A-fib clinic on 03/20/2023, he was in normal sinus rhythm at office visit. He was referred to EP.   On 04/02/2023 he notified the office that his blood pressure had been in running low at home, he reported  that he had not taken his Farxiga , Multaq , metoprolol  or midodrine  in 3 days.  Today he presents for follow-up.  He reports that he is doing well overall, he is recovering from the flu. Reports he became dizzy while at work last week after bending over to pick up some equipment, quickly resolved after he sat down.  He notes that his blood pressures have been running in the low 90s to high 80s recently.  This morning however it was 103/72.  At office visit today his initial blood pressure was 87/62, he was asymptomatic, on recheck was. He denies any lower extremity edema, orthopnea or PND.   ROS: .   Today he denies chest pain, shortness of breath, lower extremity edema, fatigue, palpitations, melena, hematuria, hemoptysis, diaphoresis, weakness, presyncope, syncope, orthopnea, and PND.  All other systems are reviewed and otherwise negative. Studies Reviewed: SABRA   EKG:  EKG is ordered today, personally reviewed, demonstrating  EKG Interpretation Date/Time:  Tuesday April 03 2023 15:21:00 EST Ventricular Rate:  60 PR Interval:  144 QRS Duration:  92 QT Interval:  434 QTC Calculation: 434 R Axis:   54  Text Interpretation: Sinus rhythm with marked sinus arrhythmia When compared with ECG of 20-Mar-2023 14:55, No significant change was found Confirmed by Kyan Giannone [phone]) on 04/03/2023 4:19:54 PM   CV Studies:  Cardiac Studies & Procedures   CARDIAC CATHETERIZATION  CARDIAC CATHETERIZATION 09/21/2022  Narrative LM: Normal LAD: Normal RI: Normal Lcx: Normal RCA: Normal  RA: 5 mmHg RV: 32/5 mmHg PA: 27/4 mmHg, mPAP 22 mmHg PCW: Could not be obtained as catheter could not reach the wedge position (Patient is 6 ft 9 in).  LV 82/4 mmHg LVEDP 8 mmHg (Patient was given 500 cc saline bolus)  No coronary artery disease Compensated nonischemic cardiomyopathy Borderline pulmonary hypertension, WHO grp II Continue GDMT for heart failure and sinus rhythm restoration Okay to resume OAC  this afternoon  Newman JINNY Lawrence, MD Pager: [phone] Office: [phone]  Findings Coronary Findings Diagnostic  Dominance: Right  No diagnostic findings have been documented. Intervention  No interventions have been documented.    ECHOCARDIOGRAM  ECHOCARDIOGRAM LIMITED 09/20/2022  Narrative ECHOCARDIOGRAM LIMITED REPORT    Patient Name:   DONELLE BABA Date of Exam: 09/20/2022 Medical Rec #:  [id number]      Height:       81.0 in Accession #:    [id number]     Weight:       198.0 lb Date of Birth:  [birth year]      BSA:          2.310 m Patient Age:    66 years       BP:           89/69 mmHg Patient Gender: M              HR:           102 bpm. Exam Location:  Inpatient  Procedure: Limited Echo, Cardiac Doppler and Color Doppler  Indications:     CHF  History:         Patient has prior history of Echocardiogram examinations, most recent 06/12/2022. CHF and Cardiomyopathy, Mitral Valve Disease; Arrythmias:Atrial Fibrillation.  Sonographer:     Jayson Gaskins Referring Phys:  [id number] SUNIT TOLIA Diagnosing Phys: Madonna Large DO  IMPRESSIONS   1. LIMITED ECHO - for more details refer to full echo from June 12, 2022. 2. Left ventricular ejection fraction, by estimation, is <20%. The left ventricle has severely decreased function. The left ventricle demonstrates global hypokinesis. The left ventricular internal cavity size was dilated. There is mild left ventricular hypertrophy. Left ventricular diastolic function could not be evaluated. 3. RVSP is underestimated due to dilated RV and RA chambers. . Right ventricular systolic function is severely reduced. The right ventricular size is severely enlarged. There is mildly elevated pulmonary artery systolic pressure. The estimated right ventricular systolic pressure is 40.0 mmHg. 4. Left atrial size was mildly dilated. 5. Right atrial size was severely dilated. 6. The mitral valve is degenerative. Severe mitral valve  regurgitation. No evidence of mitral stenosis. 7. Tricuspid valve regurgitation is severe. 8. The aortic valve is grossly normal. Aortic valve regurgitation is not visualized. No aortic stenosis is present. 9. Aortic proximal ascending aorta at upper limit of normal (37mm). There is borderline dilatation of the aortic root, measuring 38 mm. 10. Dilated pulmonary artery. 11. The inferior vena cava is dilated in size with <50% respiratory variability, suggesting right atrial pressure of 15 mmHg. 12. Rhythm strip during this exam demonstrates atrial fibrillation.  Comparison(s): A prior study was performed on 06/12/2022. No significant change.  Conclusion(s)/Recommendation(s): No left ventricular mural or apical thrombus/thrombi.  FINDINGS Left Ventricle: Left ventricular ejection fraction, by estimation, is <20%. The left ventricle has severely decreased function. The left ventricle demonstrates global hypokinesis. Definity  contrast agent was given IV to delineate the left ventricular endocardial borders. The left ventricular internal cavity size was dilated. There is mild left  ventricular hypertrophy. Left ventricular diastolic function could not be evaluated. Left ventricular diastolic function could not be evaluated due to atrial fibrillation.  Right Ventricle: RVSP is underestimated due to dilated RV and RA chambers. The right ventricular size is severely enlarged. Right ventricular systolic function is severely reduced. There is mildly elevated pulmonary artery systolic pressure. The tricuspid regurgitant velocity is 2.50 m/s, and with an assumed right atrial pressure of 15 mmHg, the estimated right ventricular systolic pressure is 40.0 mmHg.  Left Atrium: Left atrial size was mildly dilated.  Right Atrium: Right atrial size was severely dilated.  Pericardium: There is no evidence of pericardial effusion.  Mitral Valve: The mitral valve is degenerative in appearance. Severe mitral valve  regurgitation, with centrally-directed jet. No evidence of mitral valve stenosis.  Tricuspid Valve: The tricuspid valve is grossly normal. Tricuspid valve regurgitation is severe. No evidence of tricuspid stenosis.  Aortic Valve: The aortic valve is grossly normal. Aortic valve regurgitation is not visualized. No aortic stenosis is present.  Pulmonic Valve: The pulmonic valve was grossly normal.  Aorta: Proximal ascending aorta at upper limit of normal (37mm). There is borderline dilatation of the aortic root, measuring 38 mm.  Pulmonary Artery: The pulmonary artery is dilated.  Venous: The inferior vena cava is dilated in size with less than 50% respiratory variability, suggesting right atrial pressure of 15 mmHg.  EKG: Rhythm strip during this exam demonstrates atrial fibrillation.  LEFT VENTRICLE PLAX 2D LVIDd:         7.00 cm LVIDs:         6.40 cm LV PW:         1.30 cm LV IVS:        1.10 cm LVOT diam:     2.00 cm LVOT Area:     3.14 cm   IVC IVC diam: 2.80 cm  LEFT ATRIUM           Index        RIGHT ATRIUM           Index LA Vol (A2C): 62.9 ml 27.23 ml/m  RA Area:     34.40 cm LA Vol (A4C): 71.0 ml 30.73 ml/m  RA Volume:   140.00 ml 60.60 ml/m  AORTA Ao Root diam: 3.80 cm Ao Asc diam:  3.70 cm  MR Peak grad:    47.3 mmHg    TRICUSPID VALVE MR Mean grad:    27.0 mmHg    TR Peak grad:   25.0 mmHg MR Vmax:         344.00 cm/s  TR Vmax:        250.00 cm/s MR Vmean:        249.0 cm/s MR PISA:         6.28 cm     SHUNTS MR PISA Eff ROA: 109 mm      Systemic Diam: 2.00 cm MR PISA Radius:  1.00 cm  Sunit Tolia DO Electronically signed by Madonna Large DO Signature Date/Time: 09/20/2022/9:08:13 PM    Final  TEE  ECHO TEE 09/22/2022  Narrative TRANSESOPHOGEAL ECHO REPORT    Patient Name:   TECUMSEH YEAGLEY Date of Exam: 09/22/2022 Medical Rec #:  [id number]      Height:       81.0 in Accession #:    [id number]     Weight:       176.3 lb Date of Birth:   1955-12-01      BSA:  2.199 m Patient Age:    66 years       BP:           94/77 mmHg Patient Gender: M              HR:           130 bpm. Exam Location:  Inpatient  Procedure: Transesophageal Echo, Color Doppler and Cardiac Doppler  Indications:     atrial fibrillation  History:         Patient has prior history of Echocardiogram examinations, most recent 09/20/2022. Cardiomyopathy; Signs/Symptoms:Shortness of Breath.  Sonographer:     Tinnie Barefoot RDCS Referring Phys:  [id number] MADONNA LARGE Diagnosing Phys: Gordy Bergamo MD  PROCEDURE: After discussion of the risks and benefits of a TEE, an informed consent was obtained from the patient. The transesophogeal probe was passed without difficulty through the esophogus of the patient. Imaged were obtained with the patient in a left lateral decubitus position. Local oropharyngeal anesthetic was provided with Cetacaine . Sedation performed by different physician. The patient was monitored while under deep sedation. Anesthestetic sedation was provided intravenously by Anesthesiology: 40mg  of Propofol , 50mg  of Lidocaine . The patient developed no complications during the procedure. A direct current cardioversion was performed.  IMPRESSIONS   1. Left ventricular ejection fraction, by estimation, is <20%. The left ventricle has severely decreased function. The left ventricle demonstrates global hypokinesis. The left ventricular internal cavity size was mildly dilated. There is mild left ventricular hypertrophy. Left ventricular diastolic function could not be evaluated. 2. Right ventricular systolic function is moderately reduced. The right ventricular size is mildly enlarged. There is mildly elevated pulmonary artery systolic pressure. The estimated right ventricular systolic pressure is 46.6 mmHg. 3. Left atrial size was severely dilated. No left atrial/left atrial appendage thrombus was detected. The LAA emptying velocity was 15 cm/s. 4.  Right atrial size was moderately dilated. 5. The mitral valve is normal in structure. Mild mitral valve regurgitation. No evidence of mitral stenosis. 6. Mild annular dilatation. Tricuspid valve regurgitation is moderate. 7. The aortic valve is normal in structure. Aortic valve regurgitation is not visualized. No aortic stenosis is present. 8. There is mild (Grade II) plaque involving the ascending aorta and descending aorta.  Conclusion(s)/Recommendation(s): No LAA thrombus, proceed with cardioversion.  FINDINGS Left Ventricle: Left ventricular ejection fraction, by estimation, is <20%. The left ventricle has severely decreased function. The left ventricle demonstrates global hypokinesis. The left ventricular internal cavity size was mildly dilated. There is mild left ventricular hypertrophy. Left ventricular diastolic function could not be evaluated.  Right Ventricle: The right ventricular size is mildly enlarged. No increase in right ventricular wall thickness. Right ventricular systolic function is moderately reduced. There is mildly elevated pulmonary artery systolic pressure. The tricuspid regurgitant velocity is 2.81 m/s, and with an assumed right atrial pressure of 15 mmHg, the estimated right ventricular systolic pressure is 46.6 mmHg.  Left Atrium: Mild smoke. LAA well visualized with no thrombus. Left atrial size was severely dilated. Spontaneous echo contrast was present in the left atrium and left atrial appendage. No left atrial/left atrial appendage thrombus was detected. The LAA emptying velocity was 15 cm/s.  Right Atrium: Right atrial size was moderately dilated.  Pericardium: There is no evidence of pericardial effusion.  Mitral Valve: The mitral valve is normal in structure. Mild mitral valve regurgitation. No evidence of mitral valve stenosis.  Tricuspid Valve: Mild annular dilatation. The tricuspid valve is normal in structure. Tricuspid valve regurgitation is moderate .  No evidence of tricuspid stenosis.  Aortic Valve: The aortic valve is normal in structure. Aortic valve regurgitation is not visualized. No aortic stenosis is present.  Pulmonic Valve: The pulmonic valve was normal in structure. Pulmonic valve regurgitation is trivial. No evidence of pulmonic stenosis.  Aorta: The aortic root, ascending aorta and aortic arch are all structurally normal, with no evidence of dilitation or obstruction. There is mild (Grade II) plaque involving the ascending aorta and descending aorta.  Venous: Assuming dilated IVC with blunted respiratory response. The inferior vena cava was not well visualized.  IAS/Shunts: No atrial level shunt detected by color flow Doppler.   TRICUSPID VALVE TR Peak grad:   31.6 mmHg TR Vmax:        281.00 cm/s  Gordy Bergamo MD Electronically signed by Gordy Bergamo MD Signature Date/Time: 09/22/2022/6:14:45 PM    Final            Current Reported Medications:.    Current Meds  Medication Sig   dapagliflozin  propanediol (FARXIGA ) 10 MG TABS tablet Take 1 tablet (10 mg total) by mouth daily.   dronedarone  (MULTAQ ) 400 MG tablet Take 1 tablet (400 mg total) by mouth 2 (two) times daily with a meal.   ELIQUIS  5 MG TABS tablet TAKE 1 TABLET BY MOUTH TWICE A DAY   furosemide  (LASIX ) 20 MG tablet Take 20 mg by mouth daily as needed for fluid or edema (weight increase by 3 pounds over one day or 5 pounds over week.).   metoprolol  succinate (TOPROL  XL) 50 MG 24 hr tablet Take 1 tablet (50 mg total) by mouth in the morning. Hold if systolic blood pressure (top number) less than 100 mmHg or pulse less than 55 bpm.   midodrine  (PROAMATINE ) 5 MG tablet Take 1 tablet (5 mg total) by mouth 2 (two) times daily with a meal.   [DISCONTINUED] sacubitril -valsartan  (ENTRESTO ) 24-26 MG Take 1 tablet by mouth 2 (two) times daily.   Physical Exam:    VS:  BP 94/60   Pulse 60   Ht 6' 9 (2.057 m)   Wt 175 lb (79.4 kg)   SpO2 98%   BMI 18.75 kg/m     Wt Readings from Last 3 Encounters:  04/03/23 175 lb (79.4 kg)  03/20/23 176 lb 9.6 oz (80.1 kg)  03/05/23 181 lb (82.1 kg)    GEN: Well nourished, well developed in no acute distress NECK: No JVD; No carotid bruits CARDIAC: RRR, no murmurs, rubs, gallops RESPIRATORY:  Clear to auscultation without rales, wheezing or rhonchi  ABDOMEN: Soft, non-tender, non-distended EXTREMITIES:  No edema; No acute deformity   Asessement and Plan:.    PAF/hypercoagulable state: s/p TEE/DCCV on 09/21/22 and DCCV on 01/23/23. EKG today indicates sinus rhythm with PAC.  Per Dr. Michele, as patient remains in sinus rhythm will continue on Multaq . He denies bleeding problems on Eliquis , denies feeling of increased palpitations. CHA2DS2-VASc Score = 2 [CHF History: 1, HTN History: 0, Diabetes History: 0, Stroke History: 0, Vascular Disease History: 0, Age Score: 1, Gender Score: 0]. Therefore, the patient's annual risk of stroke is 2.2 %.  Patient has been referred to electrophysiology for additional management. Continue Eliquis  5 mg twice daily, metoprolol  and Multaq .  Chronic HFrEF/NICM/ MR: TEE on 09/22/2022 indicated LVEF less than 20%, LV with global hypokinesis, mild LVH, mild mitral vavle regurgitation.  GDMT includes Farxiga  10 mg daily, lasix  20 mg daily as needed, Toprol .  Given low blood pressures and increased dizziness when at work  will discontinue Entresto , if improvement in blood pressure can consider starting low-dose ARB.  Today he denies increased shortness of breath or lower extremity edema.  He appears euvolemic and well compensated on exam.  Weights are stable.  Can consider referral to advanced heart failure clinic in the future.  Low-sodium diet, fluid restriction of less than 2 L and daily weights encouraged.  Educated to contact the office for weight gain of 2 pounds overnight or 5 pounds in 1 week.  Continue Farxiga  10 mg daily, Lasix  20 mg daily as needed and metoprolol .  Hypotension: Initial  blood pressure today 87/62, on recheck was 94/60.  Patient denies any current symptoms.  Notes that he did get dizzy last week while at work, he was able to sit down with resolution of symptoms.  Will discontinue Entresto  and continue on midodrine  5 mg twice daily.  If he has noted improvement in blood pressure can consider starting low-dose ARB.  Ascending aortic aneurysm: CT on 09/19/2022 showed ascending thoracic aorta at 4.1 cm.  Continue metoprolol  succinate.  To have CT aorta in 09/2023.  Hyperlipidemia/elevated LP(a): LHC in 09/2022 with no coronary artery disease. Lp(a) 77.9. at that time.  Last lipid profile on 06/12/2022 indicated total cholesterol 121, HDL 54, triglycerides 32 and LDL 61.   Disposition: F/u with Dr. Michele in 6-8 weeks.   Signed, Wilho Sharpley D Syair Fricker, NP

## 2023-04-02 ENCOUNTER — Telehealth: Payer: Self-pay | Admitting: Cardiology

## 2023-04-02 NOTE — Telephone Encounter (Signed)
Patient reports low BP readings for the past 4 days, along with dizziness. He has not taken [place], Multaq, metoprolol or midodrine in 3 days.  Informed patient midodrine would actually help increase his BP. Patient states he took a dose of midodrine this morning around 10am. BP prior to midodrine was 93/74, HR 62. About 3 hours after taking midodrine, Patient rechecked BP while on phone with reading of 91/70, HR 77.  Patient has appt with Reather Littler, NP tomorrow 12/31. Encouraged patient to keep this appt, he verbalized understanding.

## 2023-04-02 NOTE — Telephone Encounter (Signed)
Pt c/o BP issue: STAT if pt c/o blurred vision, one-sided weakness or slurred speech  1. What are your last 5 BP readings?   12/30: 93/74 ZO10  12/29: 96/04 hr46 12/28: 87/59 hr68 12/27: 107/82 hr71            77/59      2. Are you having any other symptoms (ex. Dizziness, headache, blurred vision, passed out)? Blurred vision (not currently), dizziness   3. What is your BP issue?  Pt states he was told to not take heart medications when his bp is low.  He has not taken [place], Multaq, Metoprolol, and Midodrine in three days

## 2023-04-03 ENCOUNTER — Ambulatory Visit: Payer: BC Managed Care – PPO | Attending: Cardiology | Admitting: Cardiology

## 2023-04-03 ENCOUNTER — Encounter: Payer: Self-pay | Admitting: Cardiology

## 2023-04-03 VITALS — BP 94/60 | HR 60 | Ht >= 80 in | Wt 175.0 lb

## 2023-04-03 DIAGNOSIS — I959 Hypotension, unspecified: Secondary | ICD-10-CM

## 2023-04-03 DIAGNOSIS — D6869 Other thrombophilia: Secondary | ICD-10-CM

## 2023-04-03 DIAGNOSIS — I5022 Chronic systolic (congestive) heart failure: Secondary | ICD-10-CM | POA: Diagnosis not present

## 2023-04-03 DIAGNOSIS — I7121 Aneurysm of the ascending aorta, without rupture: Secondary | ICD-10-CM

## 2023-04-03 DIAGNOSIS — I428 Other cardiomyopathies: Secondary | ICD-10-CM

## 2023-04-03 DIAGNOSIS — I483 Typical atrial flutter: Secondary | ICD-10-CM | POA: Diagnosis not present

## 2023-04-03 DIAGNOSIS — I480 Paroxysmal atrial fibrillation: Secondary | ICD-10-CM | POA: Diagnosis not present

## 2023-04-03 DIAGNOSIS — I4819 Other persistent atrial fibrillation: Secondary | ICD-10-CM

## 2023-04-03 NOTE — Patient Instructions (Signed)
 Medication Instructions:  Stop Entresto  *If you need a refill on your cardiac medications before your next appointment, please call your pharmacy*  Lab Work: No labs  Testing/Procedures: No testing  Follow-Up: At [facility], you and your health needs are our priority.  As part of our continuing mission to provide you with exceptional heart care, we have created designated Provider Care Teams.  These Care Teams include your primary Cardiologist (physician) and Advanced Practice Providers (APPs -  Physician Assistants and Nurse Practitioners) who all work together to provide you with the care you need, when you need it.  We recommend signing up for the patient portal called MyChart.  Sign up information is provided on this After Visit Summary.  MyChart is used to connect with patients for Virtual Visits (Telemedicine).  Patients are able to view lab/test results, encounter notes, upcoming appointments, etc.  Non-urgent messages can be sent to your provider as well.   To learn more about what you can do with MyChart, go to [email or web address].    Your next appointment:   6-8 week(s)  Provider:   Madonna Large, DO

## 2023-04-10 ENCOUNTER — Telehealth: Payer: Self-pay | Admitting: Cardiology

## 2023-04-10 NOTE — Telephone Encounter (Signed)
 Pt's medication was already sent to pt's pharmacy in November 2024 with a year supply. I informed pt that he needed to contact his pharmacy to request a refill. Pt verbalized understanding.

## 2023-04-10 NOTE — Telephone Encounter (Signed)
*  STAT* If patient is at the pharmacy, call can be transferred to refill team.   1. Which medications need to be refilled? (please list name of each medication and dose if known)   dronedarone  (MULTAQ ) 400 MG tablet    4. Which pharmacy/location (including street and city if local pharmacy) is medication to be sent to? [id number] - [place], [place] - [address]     5. Do they need a 30 day or 90 day supply? 90

## 2023-05-07 ENCOUNTER — Telehealth: Payer: Self-pay | Admitting: Cardiology

## 2023-05-07 ENCOUNTER — Other Ambulatory Visit: Payer: Self-pay

## 2023-05-07 MED ORDER — DAPAGLIFLOZIN PROPANEDIOL 10 MG PO TABS: 10.0000 mg | ORAL_TABLET | Freq: Every day | ORAL | 3 refills | Status: DC

## 2023-05-07 NOTE — Telephone Encounter (Signed)
*  STAT* If patient is at the pharmacy, call can be transferred to refill team.   1. Which medications need to be refilled? (please list name of each medication and dose if known)   dapagliflozin propanediol (FARXIGA) 10 MG TABS tablet   2. Would you like to learn more about the convenience, safety, & potential cost savings by using the [facility]?   3. Are you open to using the [facility] (Type [facility]. ).   4. Which pharmacy/location (including street and city if local pharmacy) is medication to be sent to?  [id number] - [place], [place] - [address]   5. Do they need a 30 day or 90 day supply?   30 day  Patient stated he has 2 tablets left.

## 2023-05-25 ENCOUNTER — Telehealth: Payer: Self-pay | Admitting: Cardiology

## 2023-05-25 DIAGNOSIS — I480 Paroxysmal atrial fibrillation: Secondary | ICD-10-CM

## 2023-05-25 DIAGNOSIS — Z79899 Other long term (current) drug therapy: Secondary | ICD-10-CM

## 2023-05-25 MED ORDER — MULTAQ 400 MG PO TABS: 400.0000 mg | ORAL_TABLET | Freq: Two times a day (BID) | ORAL | 3 refills | Status: AC

## 2023-05-25 NOTE — Telephone Encounter (Signed)
 Pt's medication was sent to pt's pharmacy as requested. Confirmation received.

## 2023-05-25 NOTE — Telephone Encounter (Signed)
*  STAT* If patient is at the pharmacy, call can be transferred to refill team.   1. Which medications need to be refilled? (please list name of each medication and dose if known) dronedarone (MULTAQ) 400 MG tablet   2. Which pharmacy/location (including street and city if local pharmacy) is medication to be sent to?  [id number] - [place], [place] - [address]    3. Do they need a 30 day or 90 day supply? 90

## 2023-05-29 ENCOUNTER — Ambulatory Visit: Payer: BC Managed Care – PPO | Attending: Cardiology | Admitting: Cardiology

## 2023-06-03 ENCOUNTER — Other Ambulatory Visit: Payer: Self-pay | Admitting: Cardiology

## 2023-06-04 NOTE — Telephone Encounter (Signed)
 Prescription refill request for Eliquis received. Indication:afib Last office visit:12/24 Scr:1.13  10/24 Age: 68 Weight:79.4  kg  Prescription refilled

## 2023-06-18 ENCOUNTER — Telehealth: Payer: Self-pay | Admitting: Cardiology

## 2023-06-18 NOTE — Telephone Encounter (Signed)
 Spoke with patient, has not taken morning medication and current BP while on phone had come down to 151/62, heart rate 107, and has a slight headache. Patient states he had been in a heated argument this morning with family and there was "a lot of yelling" and wife also in hospital with cancer. Instructed patient that stress can be the cause of his increased BP, heart rate, and headache. Instructed patient to take his normally prescribed medication and to hold his midodrine dose this morning, patient to check his BP prior to next prescribed dose of midodrine. Patient thankful for the returned call, as he usually lets his wife handle these things and he was unsure what to do. Instructed to contact our office back if no improvement or worsens. No further needs at this time

## 2023-06-18 NOTE — Telephone Encounter (Signed)
 Pt c/o BP issue:  1. What are your last 5 BP readings?  161/63, pulse rate  is 112  2. Are you having any other symptoms (ex. Dizziness, headache, blurred vision, passed out)? He said he had a slight headache   3. What is your medication issue? Patients want to [facility] if this reading is considered high

## 2023-06-26 ENCOUNTER — Ambulatory Visit: Payer: BC Managed Care – PPO | Attending: Cardiology | Admitting: Cardiology

## 2023-06-26 ENCOUNTER — Encounter: Payer: Self-pay | Admitting: Cardiology

## 2023-06-26 VITALS — BP 115/68 | HR 66 | Resp 16 | Ht >= 80 in | Wt 183.2 lb

## 2023-06-26 DIAGNOSIS — I428 Other cardiomyopathies: Secondary | ICD-10-CM | POA: Diagnosis not present

## 2023-06-26 DIAGNOSIS — I4892 Unspecified atrial flutter: Secondary | ICD-10-CM

## 2023-06-26 DIAGNOSIS — Z7901 Long term (current) use of anticoagulants: Secondary | ICD-10-CM

## 2023-06-26 DIAGNOSIS — Z79899 Other long term (current) drug therapy: Secondary | ICD-10-CM | POA: Diagnosis not present

## 2023-06-26 DIAGNOSIS — I4891 Unspecified atrial fibrillation: Secondary | ICD-10-CM

## 2023-06-26 DIAGNOSIS — I50814 Right heart failure due to left heart failure: Secondary | ICD-10-CM

## 2023-06-26 DIAGNOSIS — I77810 Thoracic aortic ectasia: Secondary | ICD-10-CM

## 2023-06-26 NOTE — Patient Instructions (Signed)
 Medication Instructions:  Your physician recommends that you continue on your current medications as directed. Please refer to the Current Medication list given to you today.  *If you need a refill on your cardiac medications before your next appointment, please call your pharmacy*  Lab Work: None ordered today. If you have labs (blood work) drawn today and your tests are completely normal, you will receive your results only by: MyChart Message (if you have MyChart) OR A paper copy in the mail If you have any lab test that is abnormal or we need to change your treatment, we will call you to review the results.  Testing/Procedures: Your physician has requested that you have an echocardiogram. Echocardiography is a painless test that uses sound waves to create images of your heart. It provides your doctor with information about the size and shape of your heart and how well your heart's chambers and valves are working. This procedure takes approximately one hour. There are no restrictions for this procedure. Please do NOT wear cologne, perfume, aftershave, or lotions (deodorant is allowed). Please arrive 15 minutes prior to your appointment time.  Please note: We ask at that you not bring children with you during ultrasound (echo/ vascular) testing. Due to room size and safety concerns, children are not allowed in the ultrasound rooms during exams. Our front office staff cannot provide observation of children in our lobby area while testing is being conducted. An adult accompanying a patient to their appointment will only be allowed in the ultrasound room at the discretion of the ultrasound technician under special circumstances. We apologize for any inconvenience.   Follow-Up: At [facility], you and your health needs are our priority.  As part of our continuing mission to provide you with exceptional heart care, we have created designated Provider Care Teams.  These Care Teams include your  primary Cardiologist (physician) and Advanced Practice Providers (APPs -  Physician Assistants and Nurse Practitioners) who all work together to provide you with the care you need, when you need it.  We recommend signing up for the patient portal called "MyChart".  Sign up information is provided on this After Visit Summary.  MyChart is used to connect with patients for Virtual Visits (Telemedicine).  Patients are able to view lab/test results, encounter notes, upcoming appointments, etc.  Non-urgent messages can be sent to your provider as well.   To learn more about what you can do with MyChart, go to [email or web address].    Your next appointment:   6 month(s)  The format for your next appointment:   In Person  Provider:   Tessa Lerner, DO {  Other Instructions You have been referred to electrophysiology with Dr. Jimmey Ralph to discuss afib/ aflutter ablation.

## 2023-06-26 NOTE — Progress Notes (Signed)
 Cardiology Office Note:  .   Date:  06/26/2023  ID:  Leanord Hawking, DOB 14-Jan-1956, MRN [id number] PCP:  Mirna Mires, MD  Former Cardiology Providers: NA [facility] Providers Cardiologist:  Tessa Lerner, DO , [facility] (established care 06/11/2022) Electrophysiologist:  None  Click to update primary MD,subspecialty MD or APP then REFRESH:1}    Chief Complaint  Patient presents with   heart failure with reduced ejection fraction   Follow-up    History of Present Illness: .   Gaddiel Cullens is a 68 y.o. African-American male whose past medical history and cardiovascular risk factors includes: Nonischemic cardiomyopathy, chronic HFrEF, persistent atrial fibrillation/flutter status post direct-current cardioversion June 2022 and June 2024, dilated  ascending aortic, hypothyroidism, history of medication noncompliance.   In June 2024 patient was hospitalized for acute decompensated heart failure was noted to have severely reduced LVEF, atrial fibrillation.  He underwent TEE guided cardioversion and left heart catheterization confirmed the presence of nonischemic cardiomyopathy.  Once he was euvolemic patient was discharged home.  His discharge weight was 179 pounds.  In the past patient has illustrated for follow-up and therefore he was not being monitored appropriately for amiodarone side effect.  He was transitioned to [facility] and has done well.  He also was seen by atrial fibrillation clinic for consideration of possible Tikosyn or atrial fibrillation ablation given his reduced LVEF.  However he wanted to hold off on changes at that time.  He saw Ms. Reather Littler NP in December 2024 at that time he maintained a normal sinus rhythm.  Sherryll Burger was discontinued due to hypotension and was started on midodrine for hypotension.  He presents today for follow-up.  Patient denies anginal chest pain or heart failure symptoms.  Patient states that he still feels lightheaded and dizziness with  changing positions quickly and still requires midodrine 5 mg p.o. twice daily.  Patient states that he spoke to his wife and he would like to be considered for possible ablation if he is deemed candidate by electrophysiology.  Though his GDMT is limited this is the maximally tolerated doses of medications that he has tolerated in the past without becoming symptomatic with regards lightheaded or dizziness.  Review of Systems: .   Review of Systems  Cardiovascular:  Negative for chest pain, claudication, irregular heartbeat, leg swelling, near-syncope, orthopnea, palpitations, paroxysmal nocturnal dyspnea and syncope.  Respiratory:  Negative for shortness of breath.   Hematologic/Lymphatic: Negative for bleeding problem.    Studies Reviewed:   EKG: 09/2022   01/2023   EKG Interpretation Date/Time:  Tuesday June 26 2023 16:10:00 EDT Ventricular Rate:  50 PR Interval:  154 QRS Duration:  100 QT Interval:  454 QTC Calculation: 413 R Axis:   65  Text Interpretation: Sinus bradycardia with sinus arrhythmia Moderate voltage criteria for LVH, may be normal variant ( Sokolow-Lyon , Cornell product ) When compared with ECG of 03-Apr-2023 15:21, No significant change was found Confirmed by Tessa Lerner [phone]) on 06/26/2023 4:13:44 PM  Echocardiogram: 09/2022: LVEF <20%, global hypokinesis, LV cavity dilated, right ventricular systolic function severely reduced, size severely enlarged, biatrial enlargement, severe MR, severe TR, proximal ascending aorta 37 mm, aortic root 38 mm  CARDIAC CATHETERIZATION 09/21/2022   Narrative LM: Normal LAD: Normal RI: Normal Lcx: Normal RCA: Normal   RA: 5 mmHg RV: 32/5 mmHg PA: 27/4 mmHg, mPAP 22 mmHg PCW: Could not be obtained as catheter could not reach the wedge position (Patient is 6 ft 9 in).   LV  82/4 mmHg LVEDP 8 mmHg (Patient was given 500 cc saline bolus)   No coronary artery disease Compensated nonischemic cardiomyopathy Borderline  pulmonary hypertension, WHO grp II Continue GDMT for heart failure and sinus rhythm restoration  RADIOLOGY: NA  Risk Assessment/Calculations:   Click Here to Calculate/Change CHADS2VASc Score The patient's CHADS2-VASc score is 3, indicating a 3.2% annual risk of stroke.    Labs:       Latest Ref Rng & Units 01/12/2023    1:27 PM 09/22/2022    2:48 AM 09/21/2022    8:11 AM  CBC  WBC 3.4 - 10.8 x10E3/uL 6.4  4.2    Hemoglobin 13.0 - 17.7 g/dL 16.1  09.6  04.5   Hematocrit 37.5 - 51.0 % 42.1  37.9  37.0   Platelets 150 - 450 x10E3/uL 203  108         Latest Ref Rng & Units 01/12/2023    1:27 PM 09/23/2022    2:53 AM 09/22/2022    2:48 AM  BMP  Glucose 70 - 99 mg/dL 77  88  94   BUN 8 - 27 mg/dL 16  13  12    Creatinine 0.76 - 1.27 mg/dL 4.09  8.11  9.14   BUN/Creat Ratio 10 - 24 14     Sodium 134 - 144 mmol/L 142  135  136   Potassium 3.5 - 5.2 mmol/L 4.7  4.0  3.9   Chloride 96 - 106 mmol/L 104  103  103   CO2 20 - 29 mmol/L 25  22  24    Calcium 8.6 - 10.2 mg/dL 9.4  8.4  8.3       Latest Ref Rng & Units 01/12/2023    1:27 PM 09/23/2022    2:53 AM 09/22/2022    2:48 AM  CMP  Glucose 70 - 99 mg/dL 77  88  94   BUN 8 - 27 mg/dL 16  13  12    Creatinine 0.76 - 1.27 mg/dL 7.82  9.56  2.13   Sodium 134 - 144 mmol/L 142  135  136   Potassium 3.5 - 5.2 mmol/L 4.7  4.0  3.9   Chloride 96 - 106 mmol/L 104  103  103   CO2 20 - 29 mmol/L 25  22  24    Calcium 8.6 - 10.2 mg/dL 9.4  8.4  8.3   Total Protein 6.0 - 8.5 g/dL 6.7     Total Bilirubin 0.0 - 1.2 mg/dL 1.5     Alkaline Phos 44 - 121 IU/L 76     AST 0 - 40 IU/L 32     ALT 0 - 44 IU/L 38       Lab Results  Component Value Date   CHOL 121 06/12/2022   HDL 54 06/12/2022   LDLCALC 61 06/12/2022   LDLDIRECT 62 06/12/2022   TRIG 32 06/12/2022   CHOLHDL 2.2 06/12/2022   Recent Labs    09/22/22 0248  LIPOA 77.9*   No components found for: "NTPROBNP" No results for input(s): "PROBNP" in the last 8760 hours. Recent  Labs    09/20/22 0040  TSH 16.790*    Physical Exam:    Today's Vitals   06/26/23 1553  BP: 115/68  Pulse: 66  Resp: 16  SpO2: 93%  Weight: 183 lb 3.2 oz (83.1 kg)  Height: 6\' 9"  (2.057 m)   Body mass index is 19.63 kg/m. Wt Readings from Last 3 Encounters:  06/26/23 183 lb 3.2 oz (  83.1 kg)  04/03/23 175 lb (79.4 kg)  03/20/23 176 lb 9.6 oz (80.1 kg)    Physical Exam  Constitutional: No distress.  Age appropriate, hemodynamically stable.   Neck: No JVD present.  Cardiovascular: Normal rate, regular rhythm, S1 normal and S2 normal. Exam reveals no gallop, no S3 and no S4.  Murmur heard. Holosystolic murmur is present with a grade of 3/6 at the apex. Pulmonary/Chest: Effort normal and breath sounds normal. No stridor. He has no wheezes. He has no rales.  Abdominal: Soft. Bowel sounds are normal. He exhibits no distension. There is no abdominal tenderness.  Musculoskeletal:        General: No edema.     Cervical back: Neck supple.  Neurological: He is alert and oriented to person, place, and time. He has intact cranial nerves (2-12).  Skin: Skin is warm and moist.   Impression & Recommendation(s):  Impression:   ICD-10-CM   1. Atrial fibrillation/flutter (HCC)  I48.91 EKG 12-Lead   I48.92 Ambulatory referral to Cardiac Electrophysiology    ECHOCARDIOGRAM COMPLETE    2. Long term current use of antiarrhythmic drug  Z79.899     3. Long term (current) use of anticoagulants  Z79.01     4. Nonischemic cardiomyopathy (HCC)  I42.8     5. 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 Ascending aorta dilatation (HCC)  I77.810        Recommendation(s):  Persistent atrial fibrillation/flutter (HCC) Rate control: Toprol-XL. Rhythm control: Multaq. Thromboembolic prophylaxis: Eliquis Has undergone cardioversion x 2. He is illustrated that he can maintain sinus rhythm between his cardioversions. He has been evaluated by the  atrial fibrillation clinic and recommended either consideration of Tikosyn or atrial fibrillation ablation.  Initially he was hesitant to consider either but after discussing with his wife patient states that he would like to be evaluated for possible ablation.  Will refer him to Dr. Nobie Putnam from electrophysiology for further guidance.  Long term current use of antiarrhythmic drug Has done well on Multaq 400 mg p.o. twice daily. He is maintaining sinus rhythm. In the past was on amiodarone but the medication was discontinued due to concerns of noncompliance with follow-up and monitoring for side effect profile.  However since then he has improved compliance.  Long term (current) use of anticoagulants Currently on Eliquis 5 mg p.o. twice daily. Does not endorse evidence of bleeding. 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 West in December 2024 Sherryll Burger was discontinued due to hypotension Continue Farxiga 10 mg p.o. daily Continue Lasix 20 mg p.o. as needed if weight increases by 3 pounds over 1 day or 5 pounds over a week Continue Toprol-XL 50 mg p.o. daily Continue midodrine 5 mg p.o. twice daily Patient is felt to be on maximally tolerated doses of GDMT without becoming symptomatic with lightheaded/dizziness. Will proceed forward with an echocardiogram to reevaluate LVEF.  If LVEF is still severely reduced he can discuss with EP at his consultation for consideration of ICD implant as well. IF LVEF has improved will continue w/ medical therapy   Dilated ascending aorta: CT PE protocol study June 2024 reported ascending aorta to be 41 mm. Follow-up study scheduled for June  2025  Orders Placed:   Orders Placed This Encounter  Procedures   Ambulatory referral to Cardiac Electrophysiology    Referral Priority:   Routine    Referral Type:   Consultation    Referral Reason:   Specialty Services Required    Referred to Provider:   Nobie Putnam, MD    Requested Specialty:   Cardiology    Number of Visits Requested:   1   EKG 12-Lead   ECHOCARDIOGRAM COMPLETE    Standing Status:   Future    Expected Date:   07/03/2023    Expiration Date:   06/25/2024    Where should this test be performed:   [facility])    Does the patient weigh less than or greater than 250 lbs?:   Patient weighs less than 250 lbs    Perflutren DEFINITY (image enhancing agent) should be administered unless hypersensitivity or allergy exist:   Administer Perflutren    Reason for exam-Echo:   Other-Full Diagnosis List    Full ICD-10/Reason for Exam:   PAF (paroxysmal atrial fibrillation) (HCC) [161096]     Final Medication List:   No orders of the defined types were placed in this encounter.   There are no discontinued medications.   Current Outpatient Medications:    apixaban (ELIQUIS) 5 MG TABS tablet, TAKE 1 TABLET BY MOUTH TWICE A DAY, Disp: 180 tablet, Rfl: 1   dapagliflozin propanediol (FARXIGA) 10 MG TABS tablet, Take 1 tablet (10 mg total) by mouth daily., Disp: 90 tablet, Rfl: 3   dronedarone (MULTAQ) 400 MG tablet, Take 1 tablet (400 mg total) by mouth 2 (two) times daily with a meal., Disp: 180 tablet, Rfl: 3   metoprolol succinate (TOPROL XL) 50 MG 24 hr tablet, Take 1 tablet (50 mg total) by mouth in the morning. Hold if systolic blood pressure (top number) less than 100 mmHg or pulse less than 55 bpm., Disp: 90 tablet, Rfl: 3   midodrine (PROAMATINE) 5 MG tablet, Take 1 tablet (5 mg total) by mouth 2 (two) times daily with a meal., Disp: 180 tablet, Rfl: 2   furosemide (LASIX) 20 MG tablet, Take 20 mg by mouth daily as needed for fluid or edema (weight increase by 3 pounds over  one day or 5 pounds over week.). (Patient not taking: Reported on 06/26/2023), Disp: , Rfl:   Consent:   NA  Disposition:   6 months follow-up sooner if needed  His questions and concerns were addressed to his satisfaction. He voices understanding of the recommendations provided during this encounter.    Signed, Tessa Lerner, DO, [facility]  [facility]  [address] [place], [place] [address] 06/26/2023 6:24 PM

## 2023-07-11 ENCOUNTER — Telehealth: Payer: Self-pay | Admitting: Cardiology

## 2023-07-11 DIAGNOSIS — I4891 Unspecified atrial fibrillation: Secondary | ICD-10-CM

## 2023-07-11 MED ORDER — APIXABAN 5 MG PO TABS: 5.0000 mg | ORAL_TABLET | Freq: Two times a day (BID) | ORAL | 2 refills | Status: DC

## 2023-07-11 NOTE — Telephone Encounter (Signed)
*  STAT* If patient is at the pharmacy, call can be transferred to refill team.   1. Which medications need to be refilled? (please list name of each medication and dose if known)   apixaban (ELIQUIS) 5 MG TABS tablet     4. Which pharmacy/location (including street and city if local pharmacy) is medication to be sent to? [id number] - [place], [place] - [address]     5. Do they need a 30 day or 90 day supply? Pt said he only needed 30days

## 2023-07-11 NOTE — Telephone Encounter (Signed)
 Prescription refill request for Eliquis received. Indication: Afib Last office visit: 06/26/23 Garrett Carroll)  Scr: 1.13 (01/12/23)  Age: 68 Weight: 83.1kg  Appropriate dose. Refill sent.

## 2023-07-26 ENCOUNTER — Ambulatory Visit (HOSPITAL_COMMUNITY)

## 2023-08-01 ENCOUNTER — Ambulatory Visit (HOSPITAL_COMMUNITY)

## 2023-08-02 NOTE — Addendum Note (Signed)
 Addended by: Guss Legacy on: 08/02/2023 09:57 AM   Modules accepted: Orders

## 2023-08-07 ENCOUNTER — Telehealth: Payer: Self-pay | Admitting: Cardiology

## 2023-08-07 NOTE — Telephone Encounter (Signed)
 RX was sent in 05/2023. Called Pharmacy and confirmed that they received the RX for a year. Called Pt to inform and Pt verbalized understanding.

## 2023-08-07 NOTE — Telephone Encounter (Signed)
*  STAT* If patient is at the pharmacy, call can be transferred to refill team.   1. Which medications need to be refilled? (please list name of each medication and dose if known)   dapagliflozin  propanediol (FARXIGA ) 10 MG TABS tablet   2. Would you like to learn more about the convenience, safety, & potential cost savings by using the [facility]?   3. Are you open to using the [facility] (Type [facility]. ).  4. Which pharmacy/location (including street and city if local pharmacy) is medication to be sent to?  [id number] [place], [place] - [address]   5. Do they need a 30 day or 90 day supply?   30 day  Patient stated he only has 2-3 tablets left.

## 2023-08-08 ENCOUNTER — Ambulatory Visit: Admitting: Cardiology

## 2023-08-31 ENCOUNTER — Ambulatory Visit (HOSPITAL_COMMUNITY)

## 2023-09-05 ENCOUNTER — Ambulatory Visit: Attending: Cardiology | Admitting: Cardiology

## 2023-09-05 NOTE — Progress Notes (Deleted)
 Electrophysiology Office Note:    Date:  09/05/2023   ID:  Garrett Carroll, DOB Feb 19, 1956, MRN [id number]  [facility] Cardiologist:  Olinda Bertrand, DO  [facility] Electrophysiologist:  Boyce Byes, MD   Referring MD: Olinda Bertrand, DO   Chief Complaint: Atrial fibrillation  History of Present Illness:    [name] is a 68 year old man who I am seeing today for an evaluation of atrial fibrillation at the request of Dr.Tolia.  The patient has a history of nonischemic cardiomyopathy, chronic systolic heart failure and persistent atrial fibrillation flutter.  He has had 2 prior cardioversions in 2022 and 2024.  His care has been complicated by intermittent follow-up and medication compliance issues.  He has been seen by the A-fib clinic in the past.  Tikosyn and ablation were discussed with the patient in the past but he declined.       Their past medical, social and family history was reviewed.   ROS:   Please see the history of present illness.    All other systems reviewed and are negative.  EKGs/Labs/Other Studies Reviewed:    The following studies were reviewed today:  September 22, 2022 transesophageal echo EF less than 20% RV function moderately reduced Severely dilated left atrium Moderately dilated right atrium Mild MR Moderate TR  September 21, 2022 heart cath shows normal coronary arteries  June 26, 2023 EKG shows sinus rhythm, no preexcitation, LVH, QT 413 ms  January 22, 2023 EKG shows atrial flutter with 2-1 AV conduction  September 22, 2022 EKG shows atrial fibrillation, and frequent PVCs       Physical Exam:    VS:  There were no vitals taken for this visit.    Wt Readings from Last 3 Encounters:  06/26/23 183 lb 3.2 oz (83.1 kg)  04/03/23 175 lb (79.4 kg)  03/20/23 176 lb 9.6 oz (80.1 kg)     GEN: no distress CARD: RRR, No MRG RESP: No IWOB. CTAB.        ASSESSMENT AND PLAN:    No diagnosis found.  #Atrial fibrillation  flutter Persistent.  Associated with a reduced ejection fraction.  I discussed treatment options with the patient during today's clinic appointment including conservative therapy, antiarrhythmic drugs and catheter ablation.  Discussed treatment options today for AF including antiarrhythmic drug therapy and ablation. Discussed risks, recovery and likelihood of success with each treatment strategy. Risk, benefits, and alternatives to EP study and ablation for afib were discussed. These risks include but are not limited to stroke, bleeding, vascular damage, tamponade, perforation, damage to the esophagus, lungs, phrenic nerve and other structures, pulmonary vein stenosis, worsening renal function, coronary vasospasm and death.  Discussed potential need for repeat ablation procedures and antiarrhythmic drugs after an initial ablation. The patient understands these risk and wishes to proceed.  We will therefore proceed with catheter ablation at the next available time.  Carto, ICE, anesthesia are requested for the procedure.  Will also obtain CT PV protocol prior to the procedure to exclude LAA thrombus and further evaluate atrial anatomy.  Ablation strategy would be PVI plus posterior wall plus CTI.  Continue Eliquis  for stroke prophylaxis.  He has a history of heart failure with a severely reduced ejection fraction.  For this reason, I do not think dronedarone  is an appropriate antiarrhythmic drug for him.  We will stop that today.***  #Chronic systolic heart failure NYHA class II-III.  Rhythm control indicated as above.  Would plan to reassess LV function prior to  the ablation procedure.  If the EF is less than 35%, he will need ICD.  Favor the defibrillator being implanted prior to catheter ablation.  The catheter ablation could occur 3 months after ICD implant.  Plan for a Biotronik VDD ICD if indicated.  I did discuss the ICD implant procedure in detail during today's clinic appointment.  We will  hold his Eliquis  for 3 days prior to the procedure if we end up proceeding.  The patient has a non ischemic CM (E%), NYHA Class III CHF, and CAD.  He is referred by Dr Albert Huff for risk stratification of sudden death and consideration of ICD implantation.  At this time, he meets criteria for ICD implantation for primary prevention of sudden death.  I have had a thorough discussion with the patient reviewing options.  The patient and their family (if available) have had opportunities to ask questions and have them answered. The patient and I have decided together through a shared decision making process to proceed with ICD implant at this time.    Risks, benefits, alternatives to ICD implantation were discussed in detail with the patient today. The patient understands that the risks include but are not limited to bleeding, infection, pneumothorax, perforation, tamponade, vascular damage, renal failure, MI, stroke, death, inappropriate shocks, and lead dislodgement and wishes to proceed.  We will therefore schedule device implantation at the next available time.       Signed, Leanora Prophet. Marven Slimmer, MD, [facility], [facility] 09/05/2023 11:29 AM    Electrophysiology [facility]

## 2023-09-06 ENCOUNTER — Ambulatory Visit (HOSPITAL_COMMUNITY)

## 2023-09-12 MED ORDER — DAPAGLIFLOZIN PROPANEDIOL 10 MG PO TABS: 10.0000 mg | ORAL_TABLET | Freq: Every day | ORAL | 2 refills | Status: DC

## 2023-09-12 NOTE — Telephone Encounter (Signed)
 RX sent to requested Pharmacy

## 2023-09-12 NOTE — Telephone Encounter (Signed)
*  STAT* If patient is at the pharmacy, call can be transferred to refill team.   1. Which medications need to be refilled? (please list name of each medication and dose if known)   dapagliflozin  propanediol (FARXIGA ) 10 MG TABS tablet   2. Would you like to learn more about the convenience, safety, & potential cost savings by using the [facility]?   3. Are you open to using the [facility] (Type [facility]. ).  4. Which pharmacy/location (including street and city if local pharmacy) is medication to be sent to?  [id number] [place], [place] - [address]   5. Do they need a 30 day or 90 day supply?   30 day  Patient stated he has 2 tablets left.  Patient has appointment scheduled on 9/25 with Dr. Albert Huff.

## 2023-09-16 NOTE — Progress Notes (Deleted)
  Electrophysiology Office Note:    Date:  09/16/2023   ID:  Garrett Carroll, DOB 04-09-1955, MRN [id number]  [facility] Cardiologist:  Olinda Bertrand, DO  [facility] Electrophysiologist:  Boyce Byes, MD   Referring MD: Wilburn Handler, MD   Chief Complaint: Atrial fibrillation and flutter  History of Present Illness:    Garrett Carroll is a 67 year old man who I am seeing today for an evaluation of atrial fibrillation flutter at the request of Dr.Tolia.  The patient has a history of nonischemic cardiomyopathy, chronic systolic heart failure, persistent atrial fibrillation flutter, hypothyroidism and medication noncompliance.  The patient has undergone multiple prior cardioversions.  He has been seen by the A-fib clinic in the past to discuss Tikosyn or atrial fibrillation ablation.  He last saw Dr.Tolia June 26, 2023.  At that appointment he expressed a desire to pursue catheter ablation.  He is also being referred to discuss possible ICD if his EF is still reduced.      Their past medical, social and family history was reviewed.   ROS:   Please see the history of present illness.    All other systems reviewed and are negative.  EKGs/Labs/Other Studies Reviewed:    The following studies were reviewed today:  September 22, 2022 transesophageal echo EF less than 20% RV moderately reduced Dilated left atrium Mild MR  June 26, 2023 EKG shows sinus bradycardia.  QTc 413 ms.        Physical Exam:    VS:  There were no vitals taken for this visit.    Wt Readings from Last 3 Encounters:  06/26/23 183 lb 3.2 oz (83.1 kg)  04/03/23 175 lb (79.4 kg)  03/20/23 176 lb 9.6 oz (80.1 kg)     GEN: no distress CARD: RRR, No MRG RESP: No IWOB. CTAB.        ASSESSMENT AND PLAN:    No diagnosis found.  #Persistent atrial fibrillation Associated with nonischemic cardiomyopathy.  Symptomatic. On Eliquis  for stroke prophylaxis Stop Multaq  given reduced ejection  fraction*** I discussed antiarrhythmic drugs and catheter ablation during today's office visit.  We discussed Tikosyn and amiodarone  given his history of nonischemic cardiomyopathy.  Discussed treatment options today for AF including antiarrhythmic drug therapy and ablation. Discussed risks, recovery and likelihood of success with each treatment strategy. Risk, benefits, and alternatives to EP study and ablation for afib were discussed. These risks include but are not limited to stroke, bleeding, vascular damage, tamponade, perforation, damage to the esophagus, lungs, phrenic nerve and other structures, pulmonary vein stenosis, worsening renal function, coronary vasospasm and death.  Discussed potential need for repeat ablation procedures and antiarrhythmic drugs after an initial ablation. The patient understands these risk and wishes to proceed.  We will therefore proceed with catheter ablation at the next available time.  Carto, ICE, anesthesia are requested for the procedure.  Will also obtain CT PV protocol prior to the procedure to exclude LAA thrombus and further evaluate atrial anatomy.   ***Reschedule appointment until after his echo so that ICD opinion can be rendered   #Chronic systolic heart failure NYHA class II-III. ***EF?    Signed, Leanora Prophet. Marven Slimmer, MD, [facility], [facility] 09/16/2023 3:17 PM    Electrophysiology [facility]

## 2023-09-19 ENCOUNTER — Ambulatory Visit: Attending: Cardiology | Admitting: Cardiology

## 2023-09-19 DIAGNOSIS — I4891 Unspecified atrial fibrillation: Secondary | ICD-10-CM

## 2023-09-19 DIAGNOSIS — I5022 Chronic systolic (congestive) heart failure: Secondary | ICD-10-CM

## 2023-09-19 DIAGNOSIS — Z79899 Other long term (current) drug therapy: Secondary | ICD-10-CM

## 2023-09-26 ENCOUNTER — Telehealth: Payer: Self-pay | Admitting: Cardiology

## 2023-09-26 DIAGNOSIS — I5020 Unspecified systolic (congestive) heart failure: Secondary | ICD-10-CM

## 2023-09-26 NOTE — Telephone Encounter (Signed)
 Pt of Dr. Michele. Please advise on a refill of Midodrine .

## 2023-09-26 NOTE — Telephone Encounter (Signed)
*  STAT* If patient is at the pharmacy, call can be transferred to refill team.   1. Which medications need to be refilled? (please list name of each medication and dose if known)   midodrine  (PROAMATINE ) 5 MG tablet   2. Would you like to learn more about the convenience, safety, & potential cost savings by using the [facility]?   3. Are you open to using the [facility] (Type [facility]. ).  4. Which pharmacy/location (including street and city if local pharmacy) is medication to be sent to?  [id number] - [place], [place] - [address]   5. Do they need a 30 day or 90 day supply?   30 day  Patient stated he only has 2 tablets left.

## 2023-09-27 NOTE — Telephone Encounter (Signed)
 How are his blood pressures and find out it that while taking midodrine  or not at home?  Meryl Ponder [place], DO, FACC

## 2023-09-28 NOTE — Telephone Encounter (Signed)
 As you mentioned that patient is getting BP readings around 131/89 despite running out of midodrine  than I would hold off restarting midodrine .   Have him check his BP at home and based on the trend will decide if we go up on GDMT or if we need to go back on his midodrine .   As long as he is getting an average SBP of and is asymptomatic while being off on midodrine  than no need to restart. I would in fact go up on his GDMT.  Have him get setup w/ pharmD clinic for HF meds - he needs close monitoring. I am here and glad to him help if needed.   Thanks.   Zamzam Whinery [place], DO, FACC

## 2023-09-28 NOTE — Telephone Encounter (Signed)
 Spoke with pt and advised him to keep a BP log of AM and PM BP readings for the next week and to call us  back in 1 week to let us  know those readings. Explained to pt that I will send a referral in for PharmD to manage his HF meds. Pt verbalized understanding of plan and had no further questions or concerns at this time.

## 2023-09-28 NOTE — Telephone Encounter (Signed)
 Spoke with pt over the phone and asked how his BP readings have been trending. Pt states his BP readings have been fine, doesn't have any readings written down. Pt took BP while on phone and it was 131/89 HR 56. Explained to pt that I will send this information to Dr. Michele and we will let him know if the refill for Midodrine  is approved or not. Pt verbalized understanding of plan.

## 2023-10-05 LAB — BASIC METABOLIC PANEL WITH GFR
BUN/Creatinine Ratio: 13 (ref 10–24)
BUN: 13 mg/dL (ref 8–27)
CO2: 26 mmol/L (ref 20–29)
Calcium: 9.3 mg/dL (ref 8.6–10.2)
Chloride: 102 mmol/L (ref 96–106)
Creatinine, Ser: 0.99 mg/dL (ref 0.76–1.27)
Glucose: 92 mg/dL (ref 70–99)
Potassium: 4.1 mmol/L (ref 3.5–5.2)
Sodium: 143 mmol/L (ref 134–144)
eGFR: 84 mL/min/1.73 (ref >59)

## 2023-10-07 ENCOUNTER — Ambulatory Visit (HOSPITAL_BASED_OUTPATIENT_CLINIC_OR_DEPARTMENT_OTHER): Payer: Self-pay | Admitting: Family

## 2023-10-08 ENCOUNTER — Telehealth: Payer: Self-pay | Admitting: Cardiology

## 2023-10-08 MED ORDER — DAPAGLIFLOZIN PROPANEDIOL 10 MG PO TABS: 10.0000 mg | ORAL_TABLET | Freq: Every day | ORAL | 2 refills | Status: DC

## 2023-10-08 NOTE — Telephone Encounter (Signed)
 Pt's medication was sent to pt's pharmacy as requested. Confirmation received.

## 2023-10-08 NOTE — Telephone Encounter (Signed)
*  STAT* If patient is at the pharmacy, call can be transferred to refill team.   1. Which medications need to be refilled? (please list name of each medication and dose if known)   dapagliflozin  propanediol (FARXIGA ) 10 MG TABS tablet   2. Which pharmacy/location (including street and city if local pharmacy) is medication to be sent to? [id number] [place], [place] - [address] Phone: [phone]  Fax: [phone]     3. Do they need a 30 day or 90 day supply? 30

## 2023-10-09 ENCOUNTER — Ambulatory Visit (HOSPITAL_COMMUNITY)
Admission: RE | Admit: 2023-10-09 | Discharge: 2023-10-09 | Disposition: A | Discharge status: Home / Self Care | Source: Ambulatory Visit | Attending: Family | Admitting: Family

## 2023-10-09 DIAGNOSIS — I7121 Aneurysm of the ascending aorta, without rupture: Secondary | ICD-10-CM | POA: Insufficient documentation

## 2023-10-09 MED ORDER — IOHEXOL 350 MG/ML SOLN
80.0000 mL | Freq: Once | INTRAVENOUS | Status: AC | PRN
  Administered 2023-10-09: 80 mL via INTRAVENOUS

## 2023-10-11 ENCOUNTER — Ambulatory Visit: Admitting: Podiatry

## 2023-10-15 ENCOUNTER — Encounter (HOSPITAL_COMMUNITY): Payer: Self-pay

## 2023-10-15 ENCOUNTER — Ambulatory Visit (HOSPITAL_COMMUNITY)

## 2023-10-24 ENCOUNTER — Ambulatory Visit: Admitting: Podiatry

## 2023-10-24 ENCOUNTER — Encounter: Payer: Self-pay | Admitting: Podiatry

## 2023-10-24 ENCOUNTER — Ambulatory Visit (INDEPENDENT_AMBULATORY_CARE_PROVIDER_SITE_OTHER)

## 2023-10-24 DIAGNOSIS — M2042 Other hammer toe(s) (acquired), left foot: Secondary | ICD-10-CM | POA: Diagnosis not present

## 2023-10-24 DIAGNOSIS — L84 Corns and callosities: Secondary | ICD-10-CM | POA: Diagnosis not present

## 2023-10-24 NOTE — Progress Notes (Signed)
  Subjective:  Patient ID: Garrett Carroll, male    DOB: 23-Mar-1956,   MRN: [id number]  Chief Complaint  Patient presents with   Toe Pain    This toe, second left, is giving me a problem.  It gets sore.  [facility]    68 y.o. male presents for concern of left second toe pain that has been going on for a while not. Relates it gets very sore. He has tried some toe cushions but still been painful . Denies any other pedal complaints. Denies n/v/f/c.   Past Medical History:  Diagnosis Date   Atrial fibrillation (HCC)    Cardiomyopathy (HCC)    CHF (congestive heart failure) (HCC)     Objective:  Physical Exam: Vascular: DP/PT pulses 2/4 bilateral. CFT <3 seconds. Normal hair growth on digits. No edema.  Skin. No lacerations or abrasions bilateral feet.  Musculoskeletal: MMT 5/5 bilateral lower extremities in DF, PF, Inversion and Eversion. Deceased ROM in DF of ankle joint. Severe bunion deformity noted with hammered digits 2-5. Second digit hammertoe deformity on left is rigid   Neurological: Sensation intact to light touch.   Assessment:   1. Hammer toe of left foot   2. Corns and callosities      Plan:  Patient was evaluated and treated and all questions answered. -X-rays reviewed. No acute fractures noted. Severe HAV deformity noted with hammered second third and fourth digits noted.  -Educated on hammertoes and treatment options  -Hyperkeratotic lesion debrided as courtesy.  -Discussed padding including toe caps and crest pads.  -Discussed need for potential surgeryy however do not feel he would be a good surgical candidate given CHF.  -Patient to follow-up as needed. Discussed calling if any changes or increased pain.    [place] F[place], DPM

## 2023-11-20 ENCOUNTER — Ambulatory Visit (HOSPITAL_COMMUNITY)
Admission: RE | Admit: 2023-11-20 | Discharge: 2023-11-20 | Disposition: A | Discharge status: Home / Self Care | Source: Ambulatory Visit | Attending: Internal Medicine | Admitting: Internal Medicine

## 2023-11-20 DIAGNOSIS — I4891 Unspecified atrial fibrillation: Secondary | ICD-10-CM

## 2023-11-20 DIAGNOSIS — I4892 Unspecified atrial flutter: Secondary | ICD-10-CM

## 2023-11-20 LAB — ECHOCARDIOGRAM COMPLETE
Area-P 1/2: 2.22 cm2
S' Lateral: 4.5 cm

## 2023-11-20 MED ORDER — PERFLUTREN LIPID MICROSPHERE
1.0000 mL | INTRAVENOUS | Status: AC | PRN
  Administered 2023-11-20: 2 mL via INTRAVENOUS

## 2023-11-22 ENCOUNTER — Ambulatory Visit: Payer: Self-pay | Admitting: Cardiology

## 2023-11-22 DIAGNOSIS — I77810 Thoracic aortic ectasia: Secondary | ICD-10-CM

## 2023-11-28 ENCOUNTER — Ambulatory Visit: Attending: Cardiovascular Disease | Admitting: Pharmacist

## 2023-11-28 VITALS — BP 127/77 | HR 52

## 2023-11-28 DIAGNOSIS — I5020 Unspecified systolic (congestive) heart failure: Secondary | ICD-10-CM

## 2023-11-28 MED ORDER — SPIRONOLACTONE 25 MG PO TABS: 12.5000 mg | ORAL_TABLET | Freq: Every day | ORAL | 3 refills | Status: DC

## 2023-11-28 NOTE — Progress Notes (Addendum)
 Patient ID: Garrett Carroll                 DOB: 01-30-1956                      MRN: [id number]     HPI: Garrett Carroll is a 68 y.o. male referred by Dr. Michele to pharmacy clinic for HF medication management. PMH is significant for NICM, HFrEF, Afib. Most recent LVEF 25-30%on 11/20/2023.   In June 2024, the patient was hospitalized for acute decompensated heart failure and was found to have a severely reduced LVEF and atrial fibrillation. He underwent TEE-guided cardioversion and left heart catheterization, which confirmed non-ischemic cardiomyopathy. Once euvolemic, he was discharged home. Entresto  was discontinued during hospitalization due to hypotension, and midodrine  was initiated but later discontinued as his blood pressure stabilized. At his follow-up visit with Dr. Michele on 03/25, he was symptom-free.  Today, the patient presents for heart failure medication optimization. He reports that his wife, a former Engineer, civil (consulting), previously managed his medications but is currently undergoing cancer treatment, prompting him to assume responsibility for his medication management. He demonstrates good knowledge of his medications and their purposes. He reports home blood pressure readings consistently in the 110-120/68 mmHg range, though he did not bring a BP log or monitor to the visit. He remains asymptomatic, denies dizziness, lightheadedness, fatigue, chest pain, palpitations, orthopnea, PND, or lower extremity edema. He maintains an active lifestyle, is able to work without symptoms, and is fully independent with activities of daily living. His weight remains stable between 197-198 lbs, and he has not required furosemide . He adheres to a low-sodium diet. Today's visit focused on reviewing the indication for cardiac medications, discussing the principles of guideline-directed medical therapy (GDMT), and emphasizing the importance of adherence, patient engagement, and medication titration moving  forward.      Current CHF meds: Farxiga  10 mg daily, Metoprolol  XL 50 mg daily, furosemide  20 mg daily prn for edema  Previously tried: Entresto  - hypotension  Adherence Assessment  Do you ever forget to take your medication? [] Yes [x] No  Do you ever skip doses due to side effects? [] Yes [x] No  Do you have trouble affording your medicines? [] Yes [x] No  Are you ever unable to pick up your medication due to transportation difficulties? [] Yes [x] No  Do you ever stop taking your medications because you don't believe they are helping? [] Yes [x] No  Do you check your weight daily? 197  [x] Yes [] No   Adherence strategy: he remembers to take it   Barriers to obtaining medications: None   BP goal: <130/80  Family History:   Social History:  Alcohol: none  Smoking: none   Diet: eats something light in the morning- banana eggs with toast   Exercise: still works and long standing hours and walking   Home BP readings: SBP 120-125/does not recall DBP   Wt Readings from Last 3 Encounters:  06/26/23 183 lb 3.2 oz (83.1 kg)  04/03/23 175 lb (79.4 kg)  03/20/23 176 lb 9.6 oz (80.1 kg)   BP Readings from Last 3 Encounters:  11/28/23 127/77  06/26/23 115/68  04/03/23 94/60   Pulse Readings from Last 3 Encounters:  11/28/23 (!) 52  06/26/23 66  04/03/23 60    Renal function: CrCl cannot be calculated (Unknown ideal weight.).  Past Medical History:  Diagnosis Date   Atrial fibrillation (HCC)    Cardiomyopathy (HCC)    CHF (congestive heart failure) (HCC)     Current  Outpatient Medications on File Prior to Visit  Medication Sig Dispense Refill   apixaban  (ELIQUIS ) 5 MG TABS tablet Take 1 tablet (5 mg total) by mouth 2 (two) times daily. 60 tablet 2   dapagliflozin  propanediol (FARXIGA ) 10 MG TABS tablet Take 1 tablet (10 mg total) by mouth daily. 90 tablet 2   dronedarone  (MULTAQ ) 400 MG tablet Take 1 tablet (400 mg total) by mouth 2 (two) times daily with a meal. 180  tablet 3   furosemide  (LASIX ) 20 MG tablet Take 20 mg by mouth daily as needed for fluid or edema (weight increase by 3 pounds over one day or 5 pounds over week.).     metoprolol  succinate (TOPROL  XL) 50 MG 24 hr tablet Take 1 tablet (50 mg total) by mouth in the morning. Hold if systolic blood pressure (top number) less than 100 mmHg or pulse less than 55 bpm. 90 tablet 3   No current facility-administered medications on file prior to visit.    No Known Allergies   Assessment/Plan:  1. CHF -  HFrEF (heart failure with reduced ejection fraction) (HCC) Assessment: In office BP 130/80 and second reading  127/77 heart rate 52  Has not use midodrine  in long time as his BP remains stable  Tolerates current HF well without any side effects  Denies SOB, palpitation, chest pain, headaches,or swelling Has not used furosemide  PRN dose in months  Denies LEE, PND, or orthopnea. Appetite has been normal. he adheres to a low-salt diet  Plan:  Start taking spironolactone  12.5 mg daily  Continue taking Farxiga  10 gm daily and metoprolol  XL 50 mg daily  Patient to keep record of BP readings with heart rate and report to us  at the next visit Patient to see PharmD in 3 weeks for follow up  Follow up lab(s): Sept 8,2025   HFrEF (heart failure with reduced ejection fraction) (HCC) Assessment & Plan: Assessment: In office BP 130/80 and second reading  127/77 heart rate 52  Has not use midodrine  in long time as his BP remains stable  Tolerates current HF well without any side effects  Denies SOB, palpitation, chest pain, headaches,or swelling Has not used furosemide  PRN dose in months  Denies LEE, PND, or orthopnea. Appetite has been normal. he adheres to a low-salt diet  Plan:  Start taking spironolactone  12.5 mg daily  Continue taking Farxiga  10 gm daily and metoprolol  XL 50 mg daily  Patient to keep record of BP readings with heart rate and report to us  at the next visit Patient to see PharmD  in 3 weeks for follow up  Follow up lab(s): Sept 8,2025    Orders: -     Basic metabolic panel with GFR  Other orders -     Spironolactone ; Take 0.5 tablets (12.5 mg total) by mouth daily.  Dispense: 90 tablet; Refill: 3       Thank you   Robbi Blanch, Pharm.D [place] [facility]. [facility] [address], [place], [place] [address] Phone: [phone]; Fax: [phone]

## 2023-11-28 NOTE — Patient Instructions (Addendum)
 Changes made by your pharmacist Robbi Blanch, PharmD at today's visit:    Instructions/Changes  (what do you need to do) Your Notes  (what you did and when you did it)  Start taking spironolactone  12.5 mg (1/2 of the 25 mg tablet) once a day    Continue taking Farxiga  10 gm daily and metoprolol  XL 50 mg daily    Follow up lab- BMP is due on Sept 8,2025     Bring all of your meds, your BP cuff and your record of home blood pressures to your next appointment.    HOW TO TAKE YOUR BLOOD PRESSURE AT HOME  Rest 5 minutes before taking your blood pressure.  Don't smoke or drink caffeinated beverages for at least 30 minutes before. Take your blood pressure before (not after) you eat. Sit comfortably with your back supported and both feet on the floor (don't cross your legs). Elevate your arm to heart level on a table or a desk. Use the proper sized cuff. It should fit smoothly and snugly around your bare upper arm. There should be enough room to slip a fingertip under the cuff. The bottom edge of the cuff should be 1 inch above the crease of the elbow. Ideally, take 3 measurements at one sitting and record the average.  Important lifestyle changes to control high blood pressure  Intervention  Effect on the BP  Lose extra pounds and watch your waistline Weight loss is one of the most effective lifestyle changes for controlling blood pressure. If you're overweight or obese, losing even a small amount of weight can help reduce blood pressure. Blood pressure might go down by about 1 millimeter of mercury (mm Hg) with each kilogram (about 2.2 pounds) of weight lost.  Exercise regularly As a general goal, aim for at least 30 minutes of moderate physical activity every day. Regular physical activity can lower high blood pressure by about 5 to 8 mm Hg.  Eat a healthy diet Eating a diet rich in whole grains, fruits, vegetables, and low-fat dairy products and low in saturated fat and cholesterol. A  healthy diet can lower high blood pressure by up to 11 mm Hg.  Reduce salt (sodium) in your diet Even a small reduction of sodium in the diet can improve heart health and reduce high blood pressure by about 5 to 6 mm Hg.  Limit alcohol One drink equals 12 ounces of beer, 5 ounces of wine, or 1.5 ounces of 80-proof liquor.  Limiting alcohol to less than one drink a day for women or two drinks a day for men can help lower blood pressure by about 4 mm Hg.   If you have any questions or concerns please use My Chart to send questions or call the office at [phone]

## 2023-11-29 ENCOUNTER — Encounter: Payer: Self-pay | Admitting: Pharmacist

## 2023-11-29 NOTE — Assessment & Plan Note (Signed)
 Assessment: In office BP 130/80 and second reading  127/77 heart rate 52  Has not use midodrine  in long time as his BP remains stable  Tolerates current HF well without any side effects  Denies SOB, palpitation, chest pain, headaches,or swelling Has not used furosemide  PRN dose in months  Denies LEE, PND, or orthopnea. Appetite has been normal. he adheres to a low-salt diet  Plan:  Start taking spironolactone  12.5 mg daily  Continue taking Farxiga  10 gm daily and metoprolol  XL 50 mg daily  Patient to keep record of BP readings with heart rate and report to us  at the next visit Patient to see PharmD in 3 weeks for follow up  Follow up lab(s): Sept 8,2025

## 2023-12-11 ENCOUNTER — Ambulatory Visit: Attending: Pharmacist | Admitting: Pharmacist

## 2023-12-11 LAB — BASIC METABOLIC PANEL WITH GFR
BUN/Creatinine Ratio: 11 (ref 10–24)
BUN: 11 mg/dL (ref 8–27)
CO2: 25 mmol/L (ref 20–29)
Calcium: 9.2 mg/dL (ref 8.6–10.2)
Chloride: 99 mmol/L (ref 96–106)
Creatinine, Ser: 0.98 mg/dL (ref 0.76–1.27)
Glucose: 80 mg/dL (ref 70–99)
Potassium: 4.3 mmol/L (ref 3.5–5.2)
Sodium: 139 mmol/L (ref 134–144)
eGFR: 85 mL/min/1.73 (ref >59)

## 2023-12-11 NOTE — Progress Notes (Deleted)
 Patient ID: Garrett Carroll                 DOB: 1955/04/08                      MRN: [id number]     HPI: Garrett Carroll is a 68 y.o. male referred by Dr. Michele to pharmacy clinic for HF medication management. PMH is significant for NICM, HFrEF, Afib. Most recent LVEF 25-30%on 11/20/2023.   In June 2024, the patient was hospitalized for acute decompensated heart failure and was found to have a severely reduced LVEF and atrial fibrillation. He underwent TEE-guided cardioversion and left heart catheterization, which confirmed non-ischemic cardiomyopathy. Once euvolemic, he was discharged home. Entresto  was discontinued during hospitalization due to hypotension, and midodrine  was initiated but later discontinued as his blood pressure stabilized. At his follow-up visit with Dr. Michele on 03/25, he was symptom-free.  Today, the patient presents for heart failure medication optimization. He reports that his wife, a former Engineer, civil (consulting), previously managed his medications but is currently undergoing cancer treatment, prompting him to assume responsibility for his medication management. He demonstrates good knowledge of his medications and their purposes. He reports home blood pressure readings consistently in the 110-120/68 mmHg range, though he did not bring a BP log or monitor to the visit. He remains asymptomatic, denies dizziness, lightheadedness, fatigue, chest pain, palpitations, orthopnea, PND, or lower extremity edema. He maintains an active lifestyle, is able to work without symptoms, and is fully independent with activities of daily living. His weight remains stable between 197-198 lbs, and he has not required furosemide . He adheres to a low-sodium diet. Today's visit focused on reviewing the indication for cardiac medications, discussing the principles of guideline-directed medical therapy (GDMT), and emphasizing the importance of adherence, patient engagement, and medication titration moving  forward.      Current CHF meds: Farxiga  10 mg daily, Metoprolol  XL 50 mg daily, furosemide  20 mg daily prn for edema  Previously tried: Entresto  - hypotension  Adherence Assessment  Do you ever forget to take your medication? [] Yes [x] No  Do you ever skip doses due to side effects? [] Yes [x] No  Do you have trouble affording your medicines? [] Yes [x] No  Are you ever unable to pick up your medication due to transportation difficulties? [] Yes [x] No  Do you ever stop taking your medications because you don't believe they are helping? [] Yes [x] No  Do you check your weight daily? 197  [x] Yes [] No   Adherence strategy: he remembers to take it   Barriers to obtaining medications: None   BP goal: <130/80  Family History:   Social History:  Alcohol: none  Smoking: none   Diet: eats something light in the morning- banana eggs with toast   Exercise: still works and long standing hours and walking   Home BP readings: SBP 120-125/does not recall DBP   Wt Readings from Last 3 Encounters:  06/26/23 183 lb 3.2 oz (83.1 kg)  04/03/23 175 lb (79.4 kg)  03/20/23 176 lb 9.6 oz (80.1 kg)   BP Readings from Last 3 Encounters:  11/28/23 127/77  06/26/23 115/68  04/03/23 94/60   Pulse Readings from Last 3 Encounters:  11/28/23 (!) 52  06/26/23 66  04/03/23 60    Renal function: CrCl cannot be calculated (Unknown ideal weight.).  Past Medical History:  Diagnosis Date   Atrial fibrillation (HCC)    Cardiomyopathy (HCC)    CHF (congestive heart failure) (HCC)     Current  Outpatient Medications on File Prior to Visit  Medication Sig Dispense Refill   apixaban  (ELIQUIS ) 5 MG TABS tablet Take 1 tablet (5 mg total) by mouth 2 (two) times daily. 60 tablet 2   dapagliflozin  propanediol (FARXIGA ) 10 MG TABS tablet Take 1 tablet (10 mg total) by mouth daily. 90 tablet 2   dronedarone  (MULTAQ ) 400 MG tablet Take 1 tablet (400 mg total) by mouth 2 (two) times daily with a meal. 180  tablet 3   furosemide  (LASIX ) 20 MG tablet Take 20 mg by mouth daily as needed for fluid or edema (weight increase by 3 pounds over one day or 5 pounds over week.).     metoprolol  succinate (TOPROL  XL) 50 MG 24 hr tablet Take 1 tablet (50 mg total) by mouth in the morning. Hold if systolic blood pressure (top number) less than 100 mmHg or pulse less than 55 bpm. 90 tablet 3   spironolactone  (ALDACTONE ) 25 MG tablet Take 0.5 tablets (12.5 mg total) by mouth daily. 90 tablet 3   No current facility-administered medications on file prior to visit.    No Known Allergies   Assessment/Plan:  1. CHF -  No problem-specific Assessment & Plan notes found for this encounter.  There are no diagnoses linked to this encounter.      Thank you   Robbi Blanch, Pharm.D [place] [facility]. [facility] [address], [place], [place] [address] Phone: [phone]; Fax: [phone]

## 2023-12-19 ENCOUNTER — Ambulatory Visit: Attending: Cardiology | Admitting: Pharmacist

## 2023-12-19 ENCOUNTER — Encounter: Payer: Self-pay | Admitting: Pharmacist

## 2023-12-19 VITALS — BP 128/80 | HR 54

## 2023-12-19 DIAGNOSIS — I5020 Unspecified systolic (congestive) heart failure: Secondary | ICD-10-CM | POA: Diagnosis not present

## 2023-12-19 MED ORDER — LOSARTAN POTASSIUM 25 MG PO TABS: 25.0000 mg | ORAL_TABLET | Freq: Every day | ORAL | 3 refills | Status: AC

## 2023-12-19 NOTE — Patient Instructions (Addendum)
 Changes made by your pharmacist Aidah Forquer E. Ibtisam Benge, PharmD at today's visit:    Instructions/Changes  (what do you need to do) Your Notes  (what you did and when you did it)  Begin Losartan  25 mg daily    2. Go get lab work October 1st or October 2nd    3. See pharmacist back on October 27th at 3:15     Bring all of your meds, your BP cuff and your record of home blood pressures to your next appointment.    HOW TO TAKE YOUR BLOOD PRESSURE AT HOME  Rest 5 minutes before taking your blood pressure.  Don't smoke or drink caffeinated beverages for at least 30 minutes before. Take your blood pressure before (not after) you eat. Sit comfortably with your back supported and both feet on the floor (don't cross your legs). Elevate your arm to heart level on a table or a desk. Use the proper sized cuff. It should fit smoothly and snugly around your bare upper arm. There should be enough room to slip a fingertip under the cuff. The bottom edge of the cuff should be 1 inch above the crease of the elbow. Ideally, take 3 measurements at one sitting and record the average.  Important lifestyle changes to control high blood pressure  Intervention  Effect on the BP  Lose extra pounds and watch your waistline Weight loss is one of the most effective lifestyle changes for controlling blood pressure. If you're overweight or obese, losing even a small amount of weight can help reduce blood pressure. Blood pressure might go down by about 1 millimeter of mercury (mm Hg) with each kilogram (about 2.2 pounds) of weight lost.  Exercise regularly As a general goal, aim for at least 30 minutes of moderate physical activity every day. Regular physical activity can lower high blood pressure by about 5 to 8 mm Hg.  Eat a healthy diet Eating a diet rich in whole grains, fruits, vegetables, and low-fat dairy products and low in saturated fat and cholesterol. A healthy diet can lower high blood pressure by up to 11 mm  Hg.  Reduce salt (sodium) in your diet Even a small reduction of sodium in the diet can improve heart health and reduce high blood pressure by about 5 to 6 mm Hg.  Limit alcohol One drink equals 12 ounces of beer, 5 ounces of wine, or 1.5 ounces of 80-proof liquor.  Limiting alcohol to less than one drink a day for women or two drinks a day for men can help lower blood pressure by about 4 mm Hg.   If you have any questions or concerns please use My Chart to send questions or call the office at [phone]

## 2023-12-19 NOTE — Assessment & Plan Note (Addendum)
 Assessment: BP is controlled in office BP 128/80 mmHg  Tolerates Farxiga , metoprolol , and spironolactone  well without any side effects Scr and K wnl following addition of spironolactone  Pt's BP can tolerate addition of low dose ARB Denies palpitation, chest pain, headaches,or swelling Reiterated the importance of regular exercise and low salt diet   Plan:  Start taking losartan  25 mg daily Continue taking Farxiga  10 mg daily, metoprolol  50 mg daily, and spironolactone  12.5 mg daily Patient to keep record of BP readings with heart rate and report to us  at the next visit Instructed patient to monitor s/sx of hypotension and stop losartan  and contact PharmD if BP falls to 90/60 mmHg of lower Follow up BMP in 2 weeks Patient to see PharmD in 4 weeks for follow up

## 2023-12-19 NOTE — Progress Notes (Unsigned)
 Patient ID: Garrett Carroll                 DOB: [birth year]                      MRN: [id number]     HPI: Garrett Carroll is a 68 y.o. male referred by Dr. Michele to pharmacy clinic for HF medication management. PMH is significant for NICM, HFrEF, Afib. Most recent LVEF 25% - 30% on 11/20/2023.  Patient presents to HF clinic. Patient was seen last by PharmD 3 weeks ago where spironolactone  12.5 mg was added. Patient reports he is tolerating medications well. He has been checking his BP daily and presents with his BP log (largely at goal <130/80).  Confirmed BP machine accuracy at today's visit.  Patient does report some lower left quadrant pain and shoulder/arm pain and will have X ray soon (PCP following). He is aware of s/sx of MI and stroke and when to report to the ED.   Symptomatically, he denies dizziness, lightheadedness, and fatigue. Denies chest pain or palpitations. Feels SOB with minimal activity (e.g. showering). Able to complete all ADLs. Activity level normal; He remains very active at his job and does yard work. He  checks his weight at home (normal range 196-198  lbs). Denies LEE, PND, or orthopnea. Appetite has been normal. He adheres to a low-salt diet.  Current CHF meds: Farxiga  10 mg daily, metoprolol  succinate 50 mg daily, spironolactone  12.5 mg daily Previously tried: Entresto  - hypotension Scr (12/10/23): 0.98 mg/dL; Crcl: 86 ml/min Potassium (12/10/23): 4.3 mg/dL  Adherence Assessment  Do you ever forget to take your medication? [] Yes [x] No  Do you ever skip doses due to side effects? [] Yes [x] No  Do you have trouble affording your medicines? [] Yes [x] No  Are you ever unable to pick up your medication due to transportation difficulties? [] Yes [x] No  Do you ever stop taking your medications because you don't believe they are helping? [] Yes [x] No  Do you check your weight daily? [x] Yes [] No   Adherence strategy: Patient remembers to take his medications   Barriers to  obtaining medications: none  BP goal: <130/80  Family History:   Relation Problem Comments  Mother (Deceased) Breast cancer     Father (Deceased)   Sister Metallurgist)   Brother Metallurgist) Stroke      Social History:  Alcohol: none Smoking: none  Diet:  Avoids fried chicken and eats baked chicken mostly. He eats hot dogs and hamburgers occasionally from fast food resturants due to convenience but he is willing to try healthier alternatives such as salads. He eats mostly canned vegetables but rinses them off before cooking. He consumes ~ 2L of water per day.    Exercise:  Patient reports no structured exercise or weight lifting due to shoulder/arm pain but he remains very active by walking at work.   Home BP readings: Largely at goal of (<130/80); BP ranges from 105-129/70-80s Home HR: 55-60s  Wt Readings from Last 3 Encounters:  06/26/23 183 lb 3.2 oz (83.1 kg)  04/03/23 175 lb (79.4 kg)  03/20/23 176 lb 9.6 oz (80.1 kg)   BP Readings from Last 3 Encounters:  12/19/23 128/80  11/28/23 127/77  06/26/23 115/68   Pulse Readings from Last 3 Encounters:  12/19/23 (!) 54  11/28/23 (!) 52  06/26/23 66    Renal function: CrCl cannot be calculated (Unknown ideal weight.).  Past Medical History:  Diagnosis Date   Atrial fibrillation (HCC)  Cardiomyopathy (HCC)    CHF (congestive heart failure) (HCC)     Current Outpatient Medications on File Prior to Visit  Medication Sig Dispense Refill   apixaban  (ELIQUIS ) 5 MG TABS tablet Take 1 tablet (5 mg total) by mouth 2 (two) times daily. 60 tablet 2   dapagliflozin  propanediol (FARXIGA ) 10 MG TABS tablet Take 1 tablet (10 mg total) by mouth daily. 90 tablet 2   dronedarone  (MULTAQ ) 400 MG tablet Take 1 tablet (400 mg total) by mouth 2 (two) times daily with a meal. 180 tablet 3   furosemide  (LASIX ) 20 MG tablet Take 20 mg by mouth daily as needed for fluid or edema (weight increase by 3 pounds over one day or 5 pounds over week.).      metoprolol  succinate (TOPROL  XL) 50 MG 24 hr tablet Take 1 tablet (50 mg total) by mouth in the morning. Hold if systolic blood pressure (top number) less than 100 mmHg or pulse less than 55 bpm. 90 tablet 3   spironolactone  (ALDACTONE ) 25 MG tablet Take 0.5 tablets (12.5 mg total) by mouth daily. 90 tablet 3   No current facility-administered medications on file prior to visit.    No Known Allergies   Assessment/Plan:  1. CHF -  HFrEF (heart failure with reduced ejection fraction) (HCC) Assessment: BP is controlled in office BP 128/80 mmHg  Tolerates Farxiga , metoprolol , and spironolactone  well without any side effects Scr and K wnl following addition of spironolactone  Pt's BP can tolerate addition of low dose ARB Denies palpitation, chest pain, headaches,or swelling Reiterated the importance of regular exercise and low salt diet   Plan:  Start taking losartan  25 mg daily Continue taking Farxiga  10 mg daily, metoprolol  50 mg daily, and spironolactone  12.5 mg daily Patient to keep record of BP readings with heart rate and report to us  at the next visit Instructed patient to monitor s/sx of hypotension and stop losartan  and contact PharmD if BP falls to 90/60 mmHg of lower Follow up BMP in 2 weeks Patient to see PharmD in 4 weeks for follow up    HFrEF (heart failure with reduced ejection fraction) (HCC) Overview: Current CHF meds: Farxiga  10 mg daily, metoprolol  succinate 50 mg daily, spironolactone  12.5 mg daily Previously tried: Entresto  - hypotension Scr (12/10/23): 0.98 mg/dL; Crcl: 86 ml/min Potassium (12/10/23): 4.3 mg/dL    Assessment & Plan: Assessment: BP is controlled in office BP 128/80 mmHg  Tolerates Farxiga , metoprolol , and spironolactone  well without any side effects Scr and K wnl following addition of spironolactone  Pt's BP can tolerate addition of low dose ARB Denies palpitation, chest pain, headaches,or swelling Reiterated the importance of regular  exercise and low salt diet   Plan:  Start taking losartan  25 mg daily Continue taking Farxiga  10 mg daily, metoprolol  50 mg daily, and spironolactone  12.5 mg daily Patient to keep record of BP readings with heart rate and report to us  at the next visit Instructed patient to monitor s/sx of hypotension and stop losartan  and contact PharmD if BP falls to 90/60 mmHg of lower Follow up BMP in 2 weeks Patient to see PharmD in 4 weeks for follow up    Orders: -     Basic metabolic panel with GFR  Other orders -     Losartan  Potassium; Take 1 tablet (25 mg total) by mouth daily.  Dispense: 90 tablet; Refill: 3    I was present in the room during the visit and I agree with assessment and plan  Thank you   Mahamud Metts E. Maurilio Puryear, Pharm.D [facility] Elspeth BIRCH. [facility] [address], [place], [place] [address] Phone: [phone]; Fax: [phone]   Robbi Blanch, Pharm.D Phil[facility] Elspeth BIRCH. [facility] [address], [place], [place] [address] Phone: [phone]; Fax: [phone]

## 2023-12-27 ENCOUNTER — Ambulatory Visit: Attending: Cardiology | Admitting: Cardiology

## 2023-12-28 ENCOUNTER — Encounter: Payer: Self-pay | Admitting: Cardiology

## 2024-01-11 ENCOUNTER — Ambulatory Visit: Payer: Self-pay | Admitting: Pharmacist

## 2024-01-11 ENCOUNTER — Encounter: Payer: Self-pay | Admitting: Cardiology

## 2024-01-11 ENCOUNTER — Ambulatory Visit: Attending: Cardiology | Admitting: Cardiology

## 2024-01-11 VITALS — BP 102/70 | HR 62 | Ht >= 80 in | Wt 188.0 lb

## 2024-01-11 DIAGNOSIS — Z7901 Long term (current) use of anticoagulants: Secondary | ICD-10-CM | POA: Diagnosis not present

## 2024-01-11 DIAGNOSIS — I428 Other cardiomyopathies: Secondary | ICD-10-CM | POA: Diagnosis not present

## 2024-01-11 DIAGNOSIS — I4892 Unspecified atrial flutter: Secondary | ICD-10-CM

## 2024-01-11 DIAGNOSIS — Z79899 Other long term (current) drug therapy: Secondary | ICD-10-CM

## 2024-01-11 DIAGNOSIS — Z01812 Encounter for preprocedural laboratory examination: Secondary | ICD-10-CM

## 2024-01-11 DIAGNOSIS — I4891 Unspecified atrial fibrillation: Secondary | ICD-10-CM

## 2024-01-11 DIAGNOSIS — I5022 Chronic systolic (congestive) heart failure: Secondary | ICD-10-CM

## 2024-01-11 DIAGNOSIS — I77810 Thoracic aortic ectasia: Secondary | ICD-10-CM

## 2024-01-11 LAB — BASIC METABOLIC PANEL WITH GFR
BUN/Creatinine Ratio: 12 (ref 10–24)
BUN: 14 mg/dL (ref 8–27)
CO2: 26 mmol/L (ref 20–29)
Calcium: 9.5 mg/dL (ref 8.6–10.2)
Chloride: 101 mmol/L (ref 96–106)
Creatinine, Ser: 1.18 mg/dL (ref 0.76–1.27)
Glucose: 86 mg/dL (ref 70–99)
Potassium: 4.4 mmol/L (ref 3.5–5.2)
Sodium: 142 mmol/L (ref 134–144)
eGFR: 68 mL/min/1.73 (ref >59)

## 2024-01-11 NOTE — Progress Notes (Signed)
 Cardiology Office Note:  .   Date:  01/11/2024  ID:  Garrett Carroll, DOB 1955-05-03, MRN [id number] PCP:  Leigh Lung, MD  Former Cardiology Providers: NA [facility] Providers Cardiologist:  Madonna Large, DO Electrophysiologist:  OLE ONEIDA HOLTS, MD , [facility] (established care 06/11/2022) Electrophysiologist:  OLE ONEIDA HOLTS, MD  Click to update primary MD,subspecialty MD or APP then REFRESH:1}    Chief Complaint  Patient presents with   Follow-up    Atrial fibrillation/flutter, heart failure    History of Present Illness: .   Garrett Carroll is a 68 y.o. African-American male whose past medical history and cardiovascular risk factors includes: Nonischemic cardiomyopathy, chronic HFrEF, persistent atrial fibrillation/flutter status post direct-current cardioversion June 2022 and June 2024, dilated  ascending aortic, hypothyroidism, history of medication noncompliance.   In June 2024 patient was hospitalized for acute decompensated heart failure was noted to have severely reduced LVEF, atrial fibrillation.  He underwent TEE guided cardioversion and left heart catheterization confirmed the presence of nonischemic cardiomyopathy.    With regards to his atrial fibrillation and flutter patient was on amiodarone  but later discontinued due to poor compliance with follow-up.  He was transition to Multaq  and had done well.  When he was seen in A-fib clinic he was considered for possible Tikosyn or consideration of catheter directed ablation given the reduced LVEF.  But patient wanted to hold off on any changes at that time.  He remains on Multaq .  Follow-up at times has been challenging but patient trying his level best with regards to medication and office follow-ups.  He is currently seeing Pharm.D. for uptitration of GDMT.  Patient was not able to tolerate Entresto  in the past due to hypotension.  No recent hospitalization for congestive heart failure. Clinically denies anginal chest  pain or heart failure symptoms. Patient states that he does not have a CDL license or operates heavy machinery at work.   Review of Systems: .   Review of Systems  Cardiovascular:  Negative for chest pain, claudication, irregular heartbeat, leg swelling, near-syncope, orthopnea, palpitations, paroxysmal nocturnal dyspnea and syncope.  Respiratory:  Negative for shortness of breath.   Hematologic/Lymphatic: Negative for bleeding problem.    Studies Reviewed:   EKG: 09/2022   01/2023   EKG Interpretation Date/Time:  Friday January 11 2024 15:51:17 EDT Ventricular Rate:  63 PR Interval:  152 QRS Duration:  104 QT Interval:  424 QTC Calculation: 433 R Axis:   81  Text Interpretation: Normal sinus rhythm Minimal voltage criteria for LVH, may be normal variant ( Sokolow-Lyon ) When compared with ECG of 26-Jun-2023 16:10, No significant change was found Confirmed by Large Madonna [phone]) on 01/11/2024 4:22:31 PM  Echocardiogram: 09/2022: LVEF <20%, global hypokinesis, LV cavity dilated, right ventricular systolic function severely reduced, size severely enlarged, biatrial enlargement, severe MR, severe TR, proximal ascending aorta 37 mm, aortic root 38 mm  11/20/2023 LVEF 25-30% Global hypokinesis. LV size mildly dilated. RV systolic function severely reduced, size severely enlarged RV size severely dilated Mild MR Aortic root 42 mm, proximal ascending aorta 43 mm. Estimated RAP 3 mmHg  CARDIAC CATHETERIZATION 09/21/2022   Narrative LM: Normal LAD: Normal RI: Normal Lcx: Normal RCA: Normal   RA: 5 mmHg RV: 32/5 mmHg PA: 27/4 mmHg, mPAP 22 mmHg PCW: Could not be obtained as catheter could not reach the wedge position (Patient is 6 ft 9 in).   LV 82/4 mmHg LVEDP 8 mmHg (Patient was given 500 cc saline bolus)  No coronary artery disease Compensated nonischemic cardiomyopathy Borderline pulmonary hypertension, WHO grp II Continue GDMT for heart failure and sinus  rhythm restoration  RADIOLOGY: NA  Risk Assessment/Calculations:   Click Here to Calculate/Change CHADS2VASc Score The patient's CHADS2-VASc score is 2, indicating a 2.2% annual risk of stroke.    Labs:       Latest Ref Rng & Units 01/12/2023    1:27 PM 09/22/2022    2:48 AM 09/21/2022    8:11 AM  CBC  WBC 3.4 - 10.8 x10E3/uL 6.4  4.2    Hemoglobin 13.0 - 17.7 g/dL 86.1  87.1  87.3   Hematocrit 37.5 - 51.0 % 42.1  37.9  37.0   Platelets 150 - 450 x10E3/uL 203  108         Latest Ref Rng & Units 01/10/2024    3:30 PM 12/10/2023    1:01 PM 10/04/2023    2:06 PM  BMP  Glucose 70 - 99 mg/dL 86  80  92   BUN 8 - 27 mg/dL 14  11  13    Creatinine 0.76 - 1.27 mg/dL 8.81  9.01  9.00   BUN/Creat Ratio 10 - 24 12  11  13    Sodium 134 - 144 mmol/L 142  139  143   Potassium 3.5 - 5.2 mmol/L 4.4  4.3  4.1   Chloride 96 - 106 mmol/L 101  99  102   CO2 20 - 29 mmol/L 26  25  26    Calcium 8.6 - 10.2 mg/dL 9.5  9.2  9.3       Latest Ref Rng & Units 01/10/2024    3:30 PM 12/10/2023    1:01 PM 10/04/2023    2:06 PM  CMP  Glucose 70 - 99 mg/dL 86  80  92   BUN 8 - 27 mg/dL 14  11  13    Creatinine 0.76 - 1.27 mg/dL 8.81  9.01  9.00   Sodium 134 - 144 mmol/L 142  139  143   Potassium 3.5 - 5.2 mmol/L 4.4  4.3  4.1   Chloride 96 - 106 mmol/L 101  99  102   CO2 20 - 29 mmol/L 26  25  26    Calcium 8.6 - 10.2 mg/dL 9.5  9.2  9.3     Lab Results  Component Value Date   CHOL 121 06/12/2022   HDL 54 06/12/2022   LDLCALC 61 06/12/2022   LDLDIRECT 62 06/12/2022   TRIG 32 06/12/2022   CHOLHDL 2.2 06/12/2022   No results for input(s): LIPOA in the last 8760 hours.  No components found for: NTPROBNP No results for input(s): PROBNP in the last 8760 hours. No results for input(s): TSH in the last 8760 hours.   Physical Exam:    Today's Vitals   01/11/24 1554  BP: 102/70  Pulse: 62  SpO2: 95%  Weight: 188 lb (85.3 kg)  Height: 6' 9 (2.057 m)   Body mass index is 20.15  kg/m. Wt Readings from Last 3 Encounters:  01/11/24 188 lb (85.3 kg)  06/26/23 183 lb 3.2 oz (83.1 kg)  04/03/23 175 lb (79.4 kg)    Physical Exam  Constitutional: No distress.  Age appropriate, hemodynamically stable.   Neck: No JVD present.  Cardiovascular: Normal rate, regular rhythm, S1 normal and S2 normal. Exam reveals no gallop, no S3 and no S4.  Murmur heard. Holosystolic murmur is present with a grade of 3/6 at the apex. Pulmonary/Chest: Effort normal  and breath sounds normal. No stridor. He has no wheezes. He has no rales.  Abdominal: Soft. Bowel sounds are normal. He exhibits no distension. There is no abdominal tenderness.  Musculoskeletal:        General: No edema.     Cervical back: Neck supple.  Neurological: He is alert and oriented to person, place, and time. He has intact cranial nerves (2-12).  Skin: Skin is warm and moist.   Impression & Recommendation(s):  Impression:   ICD-10-CM   1. Atrial fibrillation/flutter (HCC)  I48.91 EKG 12-Lead   I48.92     2. Long term current use of antiarrhythmic drug  Z79.899     3. Long term (current) use of anticoagulants  Z79.01     4. Nonischemic cardiomyopathy (HCC)  I42.8 MR CARDIAC MORPHOLOGY W WO CONTRAST    Hemoglobin    Ambulatory referral to Cardiac Electrophysiology    5. Chronic HFrEF (heart failure with reduced ejection fraction) (HCC)  I50.22     6. Pre-procedure lab exam  Z01.812 Hemoglobin    7. Ascending aorta dilatation  I77.810        Recommendation(s):  Persistent atrial fibrillation/flutter (HCC) Long-term antiarrhythmic/anticoagulation Rate control: Toprol -XL. Rhythm control: Multaq . Thromboembolic prophylaxis: Eliquis  Has undergone cardioversion x 2. EKG today illustrates sinus rhythm. Patient states that for now he does not want to be considered for possible Tikosyn or catheter directed ablation. Patient does not endorse evidence of bleeding. Risks, benefits, and alternatives to oral  anticoagulation discussed. Antiarrhythmic history: Amiodarone  (discontinued due to poor follow-up to monitor for side effects)  Nonischemic cardiomyopathy (HCC) Biventricular heart failure with reduced left ventricular function (HCC) Stage C, NYHA class II Heart failure exacerbated when he is in A-fib/flutter Continue Farxiga  10 mg p.o. daily Continue Lasix  20 mg p.o. as needed if weight increases by 3 pounds over 1 day or 5 pounds over a week Continue Toprol -XL 50 mg p.o. daily Continue losartan  25 mg p.o. daily  Continue spironolactone  12.5 mg p.o. daily  Uptitration of further GDMT has been limited due to soft blood pressures. Was unable to tolerate Entresto  secondary to hypotension Repeat echocardiogram notes a severely reduced LVEF. Plan cardiac MRI Since establishing care patient's compliance with medical therapy and follow-up has improved.  We did discuss being considered for possible AICD implant for primary prevention.  Patient will discuss this further with his wife.  Will place consult for EP as well.   Recommended to continue follow-up with Pharm.D  Dilated ascending aorta: CT PE protocol study June 2024 reported ascending aorta to be 41 mm. Echo August 2025: Aortic root 42 mm, ascending aorta 43 mm Patient scheduled for CTA aorta gated study 11/2024 Office blood pressures are well-controlled. Patient is aware of symptoms consistent with aortic syndromes You should avoid use of Ciprofloxacin and other associated antibiotics (flouroquinolone antibiotics ) It is  best to avoid activities that cause grunting or straining (medically referred to as a "valsalva maneuver"). This happens when a person bears down against a closed throat to increase the strength of arm or abdominal muscles. There's often a tendency to do this when lifting heavy weights, doing sit-ups, push-ups or chin-ups, etc., but it may be harmful.   Orders Placed:  Orders Placed This Encounter  Procedures   MR  CARDIAC MORPHOLOGY W WO CONTRAST    Standing Status:   Future    Expiration Date:   01/10/2025    If indicated for the ordered procedure, I authorize the administration of contrast media  per Radiology protocol:   Yes    What is the patient's sedation requirement?:   No Sedation    Does the patient have a pacemaker or implanted devices?:   No    Preferred imaging location?:   [facility] (table limit 500lbs)   Hemoglobin   Ambulatory referral to Cardiac Electrophysiology    Referral Priority:   Routine    Referral Type:   Consultation    Referral Reason:   Specialty Services Required    Requested Specialty:   Cardiology    Number of Visits Requested:   1   EKG 12-Lead     Final Medication List:   No orders of the defined types were placed in this encounter.   There are no discontinued medications.   Current Outpatient Medications:    apixaban  (ELIQUIS ) 5 MG TABS tablet, Take 1 tablet (5 mg total) by mouth 2 (two) times daily., Disp: 60 tablet, Rfl: 2   dapagliflozin  propanediol (FARXIGA ) 10 MG TABS tablet, Take 1 tablet (10 mg total) by mouth daily., Disp: 90 tablet, Rfl: 2   dronedarone  (MULTAQ ) 400 MG tablet, Take 1 tablet (400 mg total) by mouth 2 (two) times daily with a meal., Disp: 180 tablet, Rfl: 3   furosemide  (LASIX ) 20 MG tablet, Take 20 mg by mouth daily as needed for fluid or edema (weight increase by 3 pounds over one day or 5 pounds over week.)., Disp: , Rfl:    losartan  (COZAAR ) 25 MG tablet, Take 1 tablet (25 mg total) by mouth daily., Disp: 90 tablet, Rfl: 3   metoprolol  succinate (TOPROL  XL) 50 MG 24 hr tablet, Take 1 tablet (50 mg total) by mouth in the morning. Hold if systolic blood pressure (top number) less than 100 mmHg or pulse less than 55 bpm., Disp: 90 tablet, Rfl: 3   spironolactone  (ALDACTONE ) 25 MG tablet, Take 0.5 tablets (12.5 mg total) by mouth daily., Disp: 90 tablet, Rfl: 3  Consent:   NA  Disposition:   6 months follow-up sooner if  needed  His questions and concerns were addressed to his satisfaction. He voices understanding of the recommendations provided during this encounter.    Signed, Madonna Michele HAS, [facility]  A Division of [facility] [address] [place], [place] [address]   01/11/2024 5:03 PM

## 2024-01-11 NOTE — Patient Instructions (Addendum)
 Medication Instructions:  Your physician recommends that you continue on your current medications as directed. Please refer to the Current Medication list given to you today.  *If you need a refill on your cardiac medications before your next appointment, please call your pharmacy*  Lab Work: At your convenience, prior to your cardiac MRI: hemoglobin If you have labs (blood work) drawn today and your tests are completely normal, you will receive your results only by: MyChart Message (if you have MyChart) OR A paper copy in the mail If you have any lab test that is abnormal or we need to change your treatment, we will call you to review the results.  Testing/Procedures: Your physician has requested that you have a cardiac MRI. Cardiac MRI uses a computer to create images of your heart as its beating, producing both still and moving pictures of your heart and major blood vessels. For further information please visit [email or web address]. Please follow the instruction sheet given to you today for more information.   Follow-Up: At [facility], you and your health needs are our priority.  As part of our continuing mission to provide you with exceptional heart care, our providers are all part of one team.  This team includes your primary Cardiologist (physician) and Advanced Practice Providers or APPs (Physician Assistants and Nurse Practitioners) who all work together to provide you with the care you need, when you need it.  Your next appointment:   6 month(s)  Provider:   Madonna Large, DO   We recommend signing up for the patient portal called MyChart.  Sign up information is provided on this After Visit Summary.  MyChart is used to connect with patients for Virtual Visits (Telemedicine).  Patients are able to view lab/test results, encounter notes, upcoming appointments, etc.  Non-urgent messages can be sent to your provider as well.    To learn more about what you can do with  MyChart, go to [email or web address].   Other Instructions   You will be scheduled for Cardiac MRI at the location below.  [facility] [address] [place], [place] [address] Please take advantage of the free valet parking available at the [facility] [employer] (Entrance C).  Proceed to the [facility] Radiology Department (First Floor) for check-in.   OR   [facility] [address] [place], [place] [address] Please go to the [facility] and check-in with the desk attendant.   Magnetic resonance imaging (MRI) is a painless test that produces images of the inside of the body without using Xrays.  During an MRI, strong magnets and radio waves work together in a Data processing manager to form detailed images.   MRI images may provide more details about a medical condition than X-rays, CT scans, and ultrasounds can provide.  You may be given earphones to listen for instructions.  You may eat a light breakfast and take medications as ordered with the exception of furosemide , hydrochlorothiazide, chlorthalidone or spironolactone  (or any other fluid pill). If you are undergoing a stress MRI, please avoid stimulants for 12 hr prior to test. (I.e. Caffeine, nicotine, chocolate, or antihistamine medications)  An IV will be inserted into one of your veins. Contrast material will be injected into your IV. It will leave your body through your urine within a day. You may be told to drink plenty of fluids to help flush the contrast material out of your system.  You will be asked to remove all metal, including: Watch,  jewelry, and other metal objects including hearing aids, hair pieces and dentures. Also wearable glucose monitoring systems (ie. Freestyle Libre and Omnipods) (Braces and fillings normally are not a problem.)   TEST WILL TAKE APPROXIMATELY 1 HOUR  PLEASE NOTIFY SCHEDULING AT LEAST 24 HOURS IN ADVANCE IF YOU ARE UNABLE TO KEEP YOUR  APPOINTMENT. [phone]  For more information and frequently asked questions, please visit our website : [email or web address]  Please call the Cardiac Imaging Nurse Navigators with any questions/concerns. [phone]   EP REFERRAL  You have been referred to our electrophysiology department to discuss an ICD. You will be contacted by a scheduler to setup an appointment to meet with one of our electrophysiologists.

## 2024-01-23 ENCOUNTER — Encounter (HOSPITAL_COMMUNITY): Payer: Self-pay

## 2024-01-25 ENCOUNTER — Other Ambulatory Visit: Payer: Self-pay | Admitting: Cardiology

## 2024-01-25 ENCOUNTER — Ambulatory Visit (HOSPITAL_COMMUNITY)
Admission: RE | Admit: 2024-01-25 | Discharge: 2024-01-25 | Disposition: A | Discharge status: Home / Self Care | Source: Ambulatory Visit | Attending: Cardiology | Admitting: Cardiology

## 2024-01-25 DIAGNOSIS — I428 Other cardiomyopathies: Secondary | ICD-10-CM

## 2024-01-25 MED ORDER — GADOBUTROL 1 MMOL/ML IV SOLN
12.0000 mL | Freq: Once | INTRAVENOUS | Status: AC | PRN
  Administered 2024-01-25: 12 mL via INTRAVENOUS

## 2024-01-27 NOTE — Progress Notes (Unsigned)
 Patient ID: Garrett Carroll                 DOB: 1956/03/31                      MRN: [id number]     HPI: Garrett Carroll is a 68 y.o. male referred by Dr. Michele to pharmacy clinic for HF medication management. PMH is significant for NICM, HFrEF, Afib. Most recent LVEF 25% - 30% on 11/20/2023.  Patient presents to HF clinic. Patient was seen last by PharmD 3 weeks ago where spironolactone  12.5 mg was added. Patient reports he is tolerating medications well. Home BP was at goal and losartan  25 mg was added to other HF meds. BMP post ARB addition was WNL.   Pt presented today for follow up reports he is doing well at home  denies dizziness, lightheadedness, and fatigue. Denies chest pain or palpitations. Feels SOB with minimal activity (e.g. showering). Able to complete all ADLs. Activity level normal; He remains very active at his job and does yard work. He  checks his weight at home (normal range 196-198  lbs). Denies LEE, PND, or orthopnea. Appetite has been normal. He adheres to a low-salt diet. Home BP ~ 110/70 heart rate 57-60. He did not use Lasix  in last few weeks     Current CHF meds: Farxiga  10 mg daily, metoprolol  succinate 50 mg daily, spironolactone  12.5 mg dailyLasix 20 mg p.o. as needed if weight increases by 3 pounds over 1 day or 5 pounds over a week, losartan  25 mg daily Previously tried: Entresto  - hypotension Scr (01/10/24): 1.18 mg/dL; Crcl: 73 ml/min Potassium (01/10/24): 4.4 mg/dL  Adherence Assessment  Do you ever forget to take your medication? [] Yes [x] No  Do you ever skip doses due to side effects? [] Yes [x] No  Do you have trouble affording your medicines? [] Yes [x] No  Are you ever unable to pick up your medication due to transportation difficulties? [] Yes [x] No  Do you ever stop taking your medications because you don't believe they are helping? [] Yes [x] No  Do you check your weight daily? [x] Yes [] No   Adherence strategy: Patient remembers to take his  medications   Barriers to obtaining medications: none  BP goal: <130/80  Family History:   Relation Problem Comments  Mother (Deceased) Breast cancer     Father (Deceased)   Sister Metallurgist)   Brother Metallurgist) Stroke      Social History:  Alcohol: none Smoking: none  Diet:  Avoids fried chicken and eats baked chicken mostly. He eats hot dogs and hamburgers occasionally from fast food resturants due to convenience but he is willing to try healthier alternatives such as salads. He eats mostly canned vegetables but rinses them off before cooking. He consumes ~ 2L of water per day.    Exercise:  Patient reports no structured exercise or weight lifting due to shoulder/arm pain but he remains very active by walking at work.   Home BP readings: Largely at goal of (<130/80); BP ranges from 105-129/70-80s Home HR: 55-60s  Wt Readings from Last 3 Encounters:  01/11/24 188 lb (85.3 kg)  06/26/23 183 lb 3.2 oz (83.1 kg)  04/03/23 175 lb (79.4 kg)   BP Readings from Last 3 Encounters:  01/28/24 (!) 100/59  01/11/24 102/70  12/19/23 128/80   Pulse Readings from Last 3 Encounters:  01/28/24 64  01/11/24 62  12/19/23 (!) 54    Renal function: CrCl cannot be calculated (Unknown ideal weight.).  Past Medical History:  Diagnosis Date   Atrial fibrillation (HCC)    Cardiomyopathy (HCC)    CHF (congestive heart failure) (HCC)     Current Outpatient Medications on File Prior to Visit  Medication Sig Dispense Refill   apixaban  (ELIQUIS ) 5 MG TABS tablet Take 1 tablet (5 mg total) by mouth 2 (two) times daily. 60 tablet 2   dapagliflozin  propanediol (FARXIGA ) 10 MG TABS tablet Take 1 tablet (10 mg total) by mouth daily. 90 tablet 2   dronedarone  (MULTAQ ) 400 MG tablet Take 1 tablet (400 mg total) by mouth 2 (two) times daily with a meal. 180 tablet 3   furosemide  (LASIX ) 20 MG tablet Take 20 mg by mouth daily as needed for fluid or edema (weight increase by 3 pounds over one day or  5 pounds over week.).     losartan  (COZAAR ) 25 MG tablet Take 1 tablet (25 mg total) by mouth daily. 90 tablet 3   metoprolol  succinate (TOPROL  XL) 50 MG 24 hr tablet Take 1 tablet (50 mg total) by mouth in the morning. Hold if systolic blood pressure (top number) less than 100 mmHg or pulse less than 55 bpm. 90 tablet 3   spironolactone  (ALDACTONE ) 25 MG tablet Take 0.5 tablets (12.5 mg total) by mouth daily. 90 tablet 3   No current facility-administered medications on file prior to visit.    No Known Allergies   Assessment/Plan:  1. CHF -  HFrEF (heart failure with reduced ejection fraction) (HCC) Assessment: BP is controlled in office BP 100/59  mmHg heart rate 64  Tolerates Farxiga , metoprolol , Losartan  and spironolactone   well without any side effects Post ARB addition BMP was WNL  Denies palpitation, chest pain, headaches,or swelling Reiterated the importance of regular exercise and low salt diet    Plan:   Continue taking Farxiga  10 mg daily, metoprolol  50 mg daily, and spironolactone  12.5 mg daily, losartan  25 mg daily  Patient to keep record of BP readings with heart rate and report to us  at the next visit Instructed patient to monitor s/sx of hypotension and stop losartan  and contact PharmD if BP falls to 90/60 mmHg of lower Patient to see MD in 6-8 months    HFrEF (heart failure with reduced ejection fraction) (HCC) Overview: Current CHF meds: Farxiga  10 mg daily, metoprolol  succinate 50 mg daily, spironolactone  12.5 mg dailyLasix 20 mg p.o. as needed if weight increases by 3 pounds over 1 day or 5 pounds over a week, losartan  25 mg daily Previously tried: Entresto  - hypotension Scr (01/10/24): 1.18 mg/dL; Crcl: 73 ml/min Potassium (01/10/24): 4.4 mg/dL    Assessment & Plan: Assessment: BP is controlled in office BP 100/59  mmHg heart rate 64  Tolerates Farxiga , metoprolol , Losartan  and spironolactone   well without any side effects Post ARB addition BMP was WNL   Denies palpitation, chest pain, headaches,or swelling Reiterated the importance of regular exercise and low salt diet    Plan:   Continue taking Farxiga  10 mg daily, metoprolol  50 mg daily, and spironolactone  12.5 mg daily, losartan  25 mg daily  Patient to keep record of BP readings with heart rate and report to us  at the next visit Instructed patient to monitor s/sx of hypotension and stop losartan  and contact PharmD if BP falls to 90/60 mmHg of lower Patient to see MD in 6-8 months       I was present in the room during the visit and I agree with assessment and plan   Thank  you   Lakedra E. White, Pharm.D [facility] [address], [place], [place] [address] Phone: [phone]; Fax: [phone]   Robbi Blanch, Pharm.D [facility] [address], [place], [place] [address] Phone: [phone]; Fax: [phone]

## 2024-01-28 ENCOUNTER — Encounter: Payer: Self-pay | Admitting: Pharmacist

## 2024-01-28 ENCOUNTER — Ambulatory Visit: Attending: Cardiology | Admitting: Pharmacist

## 2024-01-28 VITALS — BP 100/59 | HR 64

## 2024-01-28 DIAGNOSIS — I5020 Unspecified systolic (congestive) heart failure: Secondary | ICD-10-CM

## 2024-01-28 NOTE — Assessment & Plan Note (Signed)
 Assessment: BP is controlled in office BP 100/59  mmHg heart rate 64  Tolerates Farxiga , metoprolol , Losartan  and spironolactone   well without any side effects Post ARB addition BMP was WNL  Denies palpitation, chest pain, headaches,or swelling Reiterated the importance of regular exercise and low salt diet    Plan:   Continue taking Farxiga  10 mg daily, metoprolol  50 mg daily, and spironolactone  12.5 mg daily, losartan  25 mg daily  Patient to keep record of BP readings with heart rate and report to us  at the next visit Instructed patient to monitor s/sx of hypotension and stop losartan  and contact PharmD if BP falls to 90/60 mmHg of lower Patient to see MD in 6-8 months

## 2024-01-30 ENCOUNTER — Ambulatory Visit: Payer: Self-pay | Admitting: Cardiology

## 2024-02-08 ENCOUNTER — Telehealth: Payer: Self-pay | Admitting: Cardiology

## 2024-02-08 DIAGNOSIS — I4891 Unspecified atrial fibrillation: Secondary | ICD-10-CM

## 2024-02-08 MED ORDER — APIXABAN 5 MG PO TABS: 5.0000 mg | ORAL_TABLET | Freq: Two times a day (BID) | ORAL | 5 refills | Status: AC

## 2024-02-08 NOTE — Telephone Encounter (Signed)
*  STAT* If patient is at the pharmacy, call can be transferred to refill team.   1. Which medications need to be refilled? (please list name of each medication and dose if known) apixaban  (ELIQUIS ) 5 MG TABS tablet    2. Would you like to learn more about the convenience, safety, & potential cost savings by using the [facility]? No   3. Are you open to using the [facility] ([facility] No   4. Which pharmacy/location (including street and city if local pharmacy) is medication to be sent to?  [id number] - [place], [place] - [address]   5. Do they need a 30 day or 90 day supply? 30 day

## 2024-02-08 NOTE — Telephone Encounter (Signed)
 Prescription refill request for Eliquis  received. Indication:  AFIB Last office visit: 01/11/24 Scr: 1.18 01/10/24 Age:  68 Weight: 85.3 KG

## 2024-02-18 ENCOUNTER — Telehealth: Payer: Self-pay | Admitting: Cardiology

## 2024-02-18 MED ORDER — SPIRONOLACTONE 25 MG PO TABS: 12.5000 mg | ORAL_TABLET | Freq: Every day | ORAL | 3 refills | Status: AC

## 2024-02-18 NOTE — Telephone Encounter (Signed)
*  STAT* If patient is at the pharmacy, call can be transferred to refill team.   1. Which medications need to be refilled? (please list name of each medication and dose if known) spironolactone  (ALDACTONE ) 25 MG tablet [[id number]]    2. Would you like to learn more about the convenience, safety, & potential cost savings by using the [facility]? Na      3. Are you open to using the [facility] ([facility]. Na .   4. Which pharmacy/location (including street and city if local pharmacy) is medication to be sent to? [address]    5. Do they need a 30 day or 90 day supply? 90

## 2024-02-18 NOTE — Telephone Encounter (Signed)
 Pt's medication was sent to pt's pharmacy as requested. Confirmation received.

## 2024-02-22 ENCOUNTER — Ambulatory Visit: Admitting: Cardiovascular Disease

## 2024-02-22 NOTE — Progress Notes (Deleted)
  Electrophysiology Office Note:    Date:  02/22/2024   ID:  Lebert Lovern, DOB Mar 03, 1956, MRN [id number]  PCP:  Leigh Lung, MD   [facility] Providers Cardiologist:  Madonna Large, DO Electrophysiologist:  OLE ONEIDA HOLTS, MD { Click to update primary MD,subspecialty MD or APP then REFRESH:1}    Referring MD: Large Madonna, DO   History of Present Illness:    Garrett Carroll is a 68 y.o. male with a medical history significant for persistent atrial fibrillation, nonischemic cardiomyopathy, chronic CHFrEF and flutter, referred for arrhythmia management.     He has a history of persistent atrial fibrillation a flutter and underwent DC cardioversions in June 2022 and June 2024.  He had been managed with amiodarone  in the past but had difficulty with follow-up and was switched to Multaq .  Discussed the use of AI scribe software for clinical note transcription with the patient, who gave verbal consent to proceed.  History of Present Illness          Today, ***  EKGs/Labs/Other Studies Reviewed Today:     Echocardiogram:  TTE August 2025 LVE 25 to 30%.  Severely enlarged RV.  Severely dilated right atrium.  Normal left atrium.   Advanced imaging:  Cardiac MRI October 2025 LVEF 42%.  Severe RV dilation and moderately decreased RV function.   EKG:         Physical Exam:    VS:  There were no vitals taken for this visit.    Wt Readings from Last 3 Encounters:  01/11/24 188 lb (85.3 kg)  06/26/23 183 lb 3.2 oz (83.1 kg)  04/03/23 175 lb (79.4 kg)     GEN: *** Well nourished, well developed in no acute distress CARDIAC: ***RRR, no murmurs, rubs, gallops RESPIRATORY:  Normal work of breathing MUSCULOSKELETAL: *** edema    ASSESSMENT & PLAN:     Persistent atrial fibrillation ***  CHFrEF NYHA II Exacerbated by atrial fibrillation and flutter GDMT limited due to hypotension LVEF 42% on recent cardiac  MR  *** ***  *** ***  *** ***    Signed, Eulas FORBES Furbish, MD  02/22/2024 11:33 AM    [facility]

## 2024-02-23 ENCOUNTER — Other Ambulatory Visit (HOSPITAL_BASED_OUTPATIENT_CLINIC_OR_DEPARTMENT_OTHER): Payer: Self-pay | Admitting: Family

## 2024-02-23 DIAGNOSIS — I480 Paroxysmal atrial fibrillation: Secondary | ICD-10-CM

## 2024-02-23 DIAGNOSIS — Z79899 Other long term (current) drug therapy: Secondary | ICD-10-CM

## 2024-03-13 ENCOUNTER — Ambulatory Visit: Admitting: Cardiovascular Disease

## 2024-03-17 ENCOUNTER — Telehealth: Payer: Self-pay | Admitting: Cardiology

## 2024-03-17 NOTE — Telephone Encounter (Signed)
°*  STAT* If patient is at the pharmacy, call can be transferred to refill team.   1. Which medications need to be refilled? (please list name of each medication and dose if known)   losartan  (COZAAR ) 25 MG tablet     4. Which pharmacy/location (including street and city if local pharmacy) is medication to be sent to?  [id number] - [place], [place] - [address]     5. Do they need a 30 day or 90 day supply? 90

## 2024-03-18 NOTE — Telephone Encounter (Signed)
 Refill was sent 12/2023 for 1 year.

## 2024-03-18 NOTE — Telephone Encounter (Signed)
 Placed call to pt and he has been made aware that his refill was sent in September.  He verbalized understanding.

## 2024-04-07 ENCOUNTER — Telehealth: Payer: Self-pay | Admitting: Cardiology

## 2024-04-07 NOTE — Telephone Encounter (Signed)
 Patient called stating he needs a prior auth for his dapagliflozin  propanediol (FARXIGA ) 10 MG TABS tablet

## 2024-04-09 ENCOUNTER — Ambulatory Visit: Payer: Self-pay | Admitting: Cardiovascular Disease

## 2024-04-09 ENCOUNTER — Encounter: Payer: Self-pay | Admitting: Cardiovascular Disease

## 2024-04-09 VITALS — BP 97/62 | HR 54 | Resp 16 | Ht >= 80 in | Wt 190.8 lb

## 2024-04-09 DIAGNOSIS — I480 Paroxysmal atrial fibrillation: Secondary | ICD-10-CM

## 2024-04-09 NOTE — Patient Instructions (Signed)
 Medication Instructions:  Your physician recommends that you continue on your current medications as directed. Please refer to the Current Medication list given to you today.  *If you need a refill on your cardiac medications before your next appointment, please call your pharmacy*  Lab Work: None ordered.  If you have labs (blood work) drawn today and your tests are completely normal, you will receive your results only by: MyChart Message (if you have MyChart) OR A paper copy in the mail If you have any lab test that is abnormal or we need to change your treatment, we will call you to review the results.  Testing/Procedures: None ordered.   Follow-Up: At [facility], you and your health needs are our priority.  As part of our continuing mission to provide you with exceptional heart care, our providers are all part of one team.  This team includes your primary Cardiologist (physician) and Advanced Practice Providers or APPs (Physician Assistants and Nurse Practitioners) who all work together to provide you with the care you need, when you need it.  Your next appointment:   6 months with Dr Mealor

## 2024-04-09 NOTE — Progress Notes (Signed)
" °  Electrophysiology Office Note:    Date:  04/09/2024   ID:  Garrett Carroll, DOB 1955-07-26, MRN [id number]  PCP:  Mariya Mottley, Eulas BRAVO, MD   [facility] Providers Cardiologist:  Madonna Large, DO     Referring MD: Large Madonna, DO   History of Present Illness:    Garrett Carroll is a 69 y.o. male with a medical history significant for persistent atrial fibrillation, atrial flutter, nonischemic cardiomyopathy, referred for arrhythmia management, possible ICD.       Discussed the use of AI scribe software for clinical note transcription with the patient, who gave verbal consent to proceed.  History of Present Illness He has a history of atrial fibrillation and flutter and has undergone cardioversion in June 2022 and June 2024.  He also has a history of dilated ascending aorta.  He has been managed with amiodarone  in the past and was transitioned to Multaq  due to poor follow-up.  He was seen in atrial fibrillation clinic and discussed catheter ablation but deferred intervention at that time.  He was referred to discuss implantable cardioverter defibrillator, but since the referral was placed, an MRI was performed that showed that his ejection fraction is now greater than 40%.         Today, the patient reports that he feels well, and he has no complaints.  EKGs/Labs/Other Studies Reviewed Today:     Echocardiogram:  TTE August 2025 LVEF 25 to 30%.  Grade 1 diastolic dysfunction.  Severely dilated right atrium.    Advanced imaging:  Cardiac MRI October 2025 LVEF 42%.  Basal mid myocardial septal stripe consistent with dilated cardiomyopathy.  Severe RV dilation, RVEF 37%    EKG:   EKG Interpretation Date/Time:  Wednesday April 09 2024 13:45:50 EST Ventricular Rate:  51 PR Interval:  156 QRS Duration:  104 QT Interval:  432 QTC Calculation: 398 R Axis:   28  Text Interpretation: Sinus bradycardia Minimal voltage criteria for LVH, may be normal variant (  Sokolow-Lyon ) When compared with ECG of 11-Jan-2024 15:51, No significant change was found Confirmed by Nancey Eulas [phone]) on 04/09/2024 1:51:43 PM     Physical Exam:    VS:  BP 97/62 (BP Location: Left Arm, Patient Position: Sitting, Cuff Size: Normal)   Pulse (!) 54   Resp 16   Ht 6' 9 (2.057 m)   Wt 190 lb 12.8 oz (86.5 kg)   SpO2 95%   BMI 20.45 kg/m     Wt Readings from Last 3 Encounters:  04/09/24 190 lb 12.8 oz (86.5 kg)  01/11/24 188 lb (85.3 kg)  06/26/23 183 lb 3.2 oz (83.1 kg)     GEN: Well nourished, well developed in no acute distress CARDIAC: RRR, no murmurs, rubs, gallops RESPIRATORY:  Normal work of breathing MUSCULOSKELETAL: no edema    ASSESSMENT & PLAN:     Atrial flutter Documented on EKG January 22, 2023 Appears to have been doing well on Multaq   Atrial fibrillation On multiple EKGs from 2024 With a history of heart failure, ablation would be preferable to Multaq  We discussed the procedure briefly. He will continue to follow-up with Dr. Large, and I will be happy to see him again should he decide to proceed with ablation therapy, which would be safer than Multaq  in the long run given his reduced ejection fraction.   Signed, Eulas BRAVO Nancey, MD  04/09/2024 2:07 PM     [facility] "

## 2024-04-10 ENCOUNTER — Other Ambulatory Visit (HOSPITAL_COMMUNITY): Payer: Self-pay

## 2024-04-10 ENCOUNTER — Telehealth: Payer: Self-pay | Admitting: Pharmacy Technician

## 2024-04-10 NOTE — Telephone Encounter (Signed)
" °  Brand and generic need pa  Pa sent for brand   Pharmacy Patient Advocate Encounter   Received notification from Pt Calls Messages that prior authorization for farxiga  is required/requested.   Insurance verification completed.   The patient is insured through [employer].   Per test claim: PA required; PA submitted to above mentioned insurance via Latent Key/confirmation #/EOC [facility] Status is pending  "

## 2024-04-10 NOTE — Telephone Encounter (Signed)
 PA request has been Submitted. New Encounter has been or will be created for follow up. For additional info see Pharmacy Prior Auth telephone encounter from 04/10/24.

## 2024-04-10 NOTE — Telephone Encounter (Signed)
 Pt calling to f/u on prior auth. Let him know it is working.

## 2024-04-11 ENCOUNTER — Other Ambulatory Visit (HOSPITAL_BASED_OUTPATIENT_CLINIC_OR_DEPARTMENT_OTHER): Payer: Self-pay | Admitting: Family

## 2024-04-11 DIAGNOSIS — I5020 Unspecified systolic (congestive) heart failure: Secondary | ICD-10-CM

## 2024-04-11 MED ORDER — DAPAGLIFLOZIN PROPANEDIOL 10 MG PO TABS: 10.0000 mg | ORAL_TABLET | Freq: Every day | ORAL | Status: AC

## 2024-04-11 NOTE — Telephone Encounter (Addendum)
 Pt stopped by office - out of farxiga  - PA in process. 14 day sample provided.  Lot [id number] Exp date 10/01/26  Reche GORMAN Finder, NP

## 2024-04-11 NOTE — Telephone Encounter (Signed)
 Still waiting on insurance answer in latent

## 2024-04-14 ENCOUNTER — Other Ambulatory Visit (HOSPITAL_COMMUNITY): Payer: Self-pay

## 2024-04-14 NOTE — Telephone Encounter (Signed)
 Prior authorization was sent for brand but insurance denied stating Requires documentation of inability to obtain brand name Farxiga  due to market availability before generic dapagliflozin  can be approved.     Faxed insurance asking for approval of brand farxiga 

## 2024-04-15 NOTE — Telephone Encounter (Signed)
 Pharmacy Patient Advocate Encounter   Received notification from CoverMyMeds that prior authorization for farxiga  is required/requested.   Insurance verification completed.   The patient is insured through [employer].   Per test claim: PA required; PA submitted to above mentioned insurance via Latent Key/confirmation #/EOC [id number] Status is pending   Pharmacy Patient Advocate Encounter  Received notification from [employer] that Prior Authorization for farxiga  has been APPROVED from 04/15/24 to 04/02/2098   PA #/Case ID/Reference #: [id number]

## 2024-11-03 ENCOUNTER — Other Ambulatory Visit (HOSPITAL_COMMUNITY): Payer: Self-pay
